# Patient Record
Sex: Male | Born: 1955
Health system: Southern US, Community
[De-identification: ages and names within clinical notes are randomized; demographics above are authoritative.]

## PROBLEM LIST (undated history)

## (undated) DIAGNOSIS — F1010 Alcohol abuse, uncomplicated: Secondary | ICD-10-CM

## (undated) DIAGNOSIS — B1920 Unspecified viral hepatitis C without hepatic coma: Secondary | ICD-10-CM

## (undated) DIAGNOSIS — K219 Gastro-esophageal reflux disease without esophagitis: Secondary | ICD-10-CM

## (undated) DIAGNOSIS — I10 Essential (primary) hypertension: Secondary | ICD-10-CM

## (undated) DIAGNOSIS — M542 Cervicalgia: Secondary | ICD-10-CM

## (undated) HISTORY — PX: CHOLECYSTECTOMY: SHX55

## (undated) HISTORY — PX: OTHER SURGICAL HISTORY: SHX169

## (undated) HISTORY — DX: Unspecified viral hepatitis C without hepatic coma: B19.20

## (undated) HISTORY — PX: VASECTOMY: SHX75

## (undated) HISTORY — PX: HERNIA REPAIR: SHX51

## (undated) HISTORY — PX: KNEE ARTHROSCOPY: SUR90

---

## 2003-01-30 ENCOUNTER — Ambulatory Visit (HOSPITAL_COMMUNITY): Admission: RE | Admit: 2003-01-30 | Discharge: 2003-01-30 | Payer: Self-pay | Admitting: Family Medicine

## 2003-01-30 ENCOUNTER — Encounter: Payer: Self-pay | Admitting: Family Medicine

## 2003-02-09 ENCOUNTER — Encounter: Payer: Self-pay | Admitting: Family Medicine

## 2003-02-09 ENCOUNTER — Encounter (HOSPITAL_COMMUNITY): Admission: RE | Admit: 2003-02-09 | Discharge: 2003-03-11 | Payer: Self-pay | Admitting: Family Medicine

## 2003-02-20 ENCOUNTER — Inpatient Hospital Stay (HOSPITAL_COMMUNITY): Admission: EM | Admit: 2003-02-20 | Discharge: 2003-02-23 | Payer: Self-pay | Admitting: Emergency Medicine

## 2003-02-20 ENCOUNTER — Encounter: Payer: Self-pay | Admitting: Emergency Medicine

## 2003-02-21 ENCOUNTER — Encounter: Payer: Self-pay | Admitting: Internal Medicine

## 2003-02-23 ENCOUNTER — Encounter: Payer: Self-pay | Admitting: Cardiology

## 2003-03-20 ENCOUNTER — Encounter: Admission: RE | Admit: 2003-03-20 | Discharge: 2003-03-20 | Payer: Self-pay | Admitting: Orthopedic Surgery

## 2003-03-20 ENCOUNTER — Encounter: Payer: Self-pay | Admitting: Radiology

## 2003-03-20 ENCOUNTER — Encounter: Payer: Self-pay | Admitting: Orthopedic Surgery

## 2003-04-24 ENCOUNTER — Encounter: Admission: RE | Admit: 2003-04-24 | Discharge: 2003-04-24 | Payer: Self-pay | Admitting: Orthopedic Surgery

## 2004-08-09 ENCOUNTER — Ambulatory Visit: Payer: Self-pay | Admitting: Internal Medicine

## 2004-09-02 ENCOUNTER — Ambulatory Visit (HOSPITAL_COMMUNITY): Admission: RE | Admit: 2004-09-02 | Discharge: 2004-09-02 | Payer: Self-pay | Admitting: Orthopedic Surgery

## 2004-09-02 ENCOUNTER — Encounter (INDEPENDENT_AMBULATORY_CARE_PROVIDER_SITE_OTHER): Payer: Self-pay | Admitting: Specialist

## 2004-11-10 ENCOUNTER — Encounter: Admission: RE | Admit: 2004-11-10 | Discharge: 2004-11-10 | Payer: Self-pay | Admitting: Internal Medicine

## 2006-07-06 ENCOUNTER — Ambulatory Visit: Payer: Self-pay | Admitting: Internal Medicine

## 2006-07-20 ENCOUNTER — Ambulatory Visit (HOSPITAL_COMMUNITY): Admission: RE | Admit: 2006-07-20 | Discharge: 2006-07-20 | Payer: Self-pay | Admitting: Internal Medicine

## 2006-08-24 ENCOUNTER — Ambulatory Visit: Payer: Self-pay | Admitting: Internal Medicine

## 2007-05-24 ENCOUNTER — Ambulatory Visit (HOSPITAL_COMMUNITY): Admission: RE | Admit: 2007-05-24 | Discharge: 2007-05-24 | Payer: Self-pay | Admitting: Orthopaedic Surgery

## 2007-06-12 ENCOUNTER — Ambulatory Visit (HOSPITAL_COMMUNITY): Admission: RE | Admit: 2007-06-12 | Discharge: 2007-06-12 | Payer: Self-pay | Admitting: Neurosurgery

## 2009-02-23 ENCOUNTER — Ambulatory Visit (HOSPITAL_COMMUNITY): Admission: RE | Admit: 2009-02-23 | Discharge: 2009-02-23 | Payer: Self-pay | Admitting: Internal Medicine

## 2009-05-26 ENCOUNTER — Ambulatory Visit (HOSPITAL_COMMUNITY): Admission: RE | Admit: 2009-05-26 | Discharge: 2009-05-27 | Payer: Self-pay | Admitting: General Surgery

## 2009-05-26 ENCOUNTER — Encounter (INDEPENDENT_AMBULATORY_CARE_PROVIDER_SITE_OTHER): Payer: Self-pay | Admitting: General Surgery

## 2009-10-22 ENCOUNTER — Ambulatory Visit (HOSPITAL_COMMUNITY): Admission: RE | Admit: 2009-10-22 | Discharge: 2009-10-22 | Payer: Self-pay | Admitting: Orthopaedic Surgery

## 2010-03-04 ENCOUNTER — Ambulatory Visit (HOSPITAL_COMMUNITY): Admission: RE | Admit: 2010-03-04 | Discharge: 2010-03-04 | Payer: Self-pay | Admitting: General Surgery

## 2010-08-25 LAB — BASIC METABOLIC PANEL
BUN: 9 mg/dL (ref 6–23)
CO2: 26 mEq/L (ref 19–32)
Creatinine, Ser: 0.8 mg/dL (ref 0.4–1.5)
GFR calc Af Amer: >60 mL/min (ref >60)
Glucose, Bld: 112 mg/dL — ABNORMAL HIGH (ref 70–99)

## 2010-08-25 LAB — CBC
HCT: 46.8 % (ref 39.0–52.0)
Hemoglobin: 16 g/dL (ref 13.0–17.0)
MCH: 32.4 pg (ref 26.0–34.0)
MCV: 94.7 fL (ref 78.0–100.0)
Platelets: 234 10*3/uL (ref 150–400)
RBC: 4.94 MIL/uL (ref 4.22–5.81)
WBC: 7.9 10*3/uL (ref 4.0–10.5)

## 2010-08-25 LAB — DIFFERENTIAL
Eosinophils Absolute: 0.2 10*3/uL (ref 0.0–0.7)
Monocytes Relative: 11 % (ref 3–12)
Neutrophils Relative %: 54 % (ref 43–77)

## 2010-08-25 LAB — HEPATIC FUNCTION PANEL: Alkaline Phosphatase: 55 U/L (ref 39–117)

## 2010-08-25 LAB — SURGICAL PCR SCREEN: MRSA, PCR: NEGATIVE

## 2010-09-12 LAB — CBC
HCT: 41.8 % (ref 39.0–52.0)
Hemoglobin: 14.4 g/dL (ref 13.0–17.0)
MCHC: 34.4 g/dL (ref 30.0–36.0)
WBC: 10.3 10*3/uL (ref 4.0–10.5)

## 2010-09-12 LAB — HEPATIC FUNCTION PANEL
Albumin: 3.2 g/dL — ABNORMAL LOW (ref 3.5–5.2)
Indirect Bilirubin: 0.5 mg/dL (ref 0.3–0.9)
Total Protein: 6.2 g/dL (ref 6.0–8.3)

## 2010-09-12 LAB — BASIC METABOLIC PANEL
BUN: 3 mg/dL — ABNORMAL LOW (ref 6–23)
CO2: 25 mEq/L (ref 19–32)
Calcium: 8.6 mg/dL (ref 8.4–10.5)
Chloride: 106 mEq/L (ref 96–112)
Creatinine, Ser: 0.76 mg/dL (ref 0.4–1.5)
GFR calc Af Amer: >60 mL/min (ref >60)
GFR calc non Af Amer: >60 mL/min (ref >60)
Glucose, Bld: 113 mg/dL — ABNORMAL HIGH (ref 70–99)
Potassium: 4.2 mEq/L (ref 3.5–5.1)

## 2010-09-12 LAB — DIFFERENTIAL
Basophils Absolute: 0 10*3/uL (ref 0.0–0.1)
Eosinophils Relative: 1 % (ref 0–5)
Lymphocytes Relative: 13 % (ref 12–46)
Lymphs Abs: 1.3 10*3/uL (ref 0.7–4.0)
Neutrophils Relative %: 76 % (ref 43–77)

## 2010-09-13 LAB — CBC
HCT: 46.3 % (ref 39.0–52.0)
Hemoglobin: 15.8 g/dL (ref 13.0–17.0)
MCHC: 34.1 g/dL (ref 30.0–36.0)
MCV: 95.7 fL (ref 78.0–100.0)
Platelets: 273 10*3/uL (ref 150–400)
RDW: 12.4 % (ref 11.5–15.5)

## 2010-09-13 LAB — DIFFERENTIAL
Basophils Relative: 0 % (ref 0–1)
Eosinophils Absolute: 0.1 10*3/uL (ref 0.0–0.7)
Eosinophils Relative: 2 % (ref 0–5)
Lymphocytes Relative: 29 % (ref 12–46)
Monocytes Relative: 9 % (ref 3–12)
Neutrophils Relative %: 60 % (ref 43–77)

## 2010-09-13 LAB — HEPATIC FUNCTION PANEL: AST: 180 U/L — ABNORMAL HIGH (ref 0–37)

## 2010-09-13 LAB — BASIC METABOLIC PANEL
Calcium: 9.6 mg/dL (ref 8.4–10.5)
Chloride: 103 mEq/L (ref 96–112)
Creatinine, Ser: 0.81 mg/dL (ref 0.4–1.5)
GFR calc non Af Amer: >60 mL/min (ref >60)
Glucose, Bld: 152 mg/dL — ABNORMAL HIGH (ref 70–99)

## 2010-09-13 LAB — PROTIME-INR
INR: 0.99 (ref 0.00–1.49)
Prothrombin Time: 13 seconds (ref 11.6–15.2)

## 2010-10-28 NOTE — Procedures (Signed)
   NAME:  Micheal Serrano, Micheal Serrano NO.:  [id number]   MEDICAL RECORD NO.:  [id number]                   PATIENT TYPE:  INP   LOCATION:  A212                                 FACILITY:  [facility]   PHYSICIAN:  Jesse Sans. Wall, M.D.                DATE OF BIRTH:  08-16-1955   DATE OF PROCEDURE:  02/23/2003  DATE OF DISCHARGE:                                    STRESS TEST   EXERCISE CARDIOLITE   INDICATION:  [name] is a 55 year old male who no known coronary artery  disease who cardiac risk factors include tobacco abuse and male sex.  He  presented to the emergency room with atypical chest discomfort.  He has had  three sets of cardiac enzymes which are negative for acute myocardial  infarction.   BASELINE DATA:  EKG revealed sinus rhythm at 91 beats per minute with  nonspecific ST abnormalities.  Blood pressure is 120/88.   The patient exercised for a total of nine minutes 15 seconds to Bruce  protocol stage 4 and 10.1 METs.  The patient denied any chest discomfort.  He did have some mild shortness of breath toward the end of exercise.  He  had some leg fatigue as well.  Maximum heart rate achieved is 151 beats per  minute which is 86% of predicted maximum.  Maximum blood pressure is 178/88  which resolved down to 148/78.  EKG revealed some upsloping ST depression;  however, no ischemic changes and no arrhythmias were noted.   Final images and results are pending M.D. review.     Amy Mercy Riding, P.A. [facility]                     Thomas C. Wall, M.D.    AB/MEDQ  D:  02/23/2003  T:  02/23/2003  Job:  [id number]

## 2010-10-28 NOTE — H&P (Signed)
NAME:  Micheal Serrano, Micheal Serrano NO.:  [id number]   MEDICAL RECORD NO.:  [id number]                   PATIENT TYPE:  INP   LOCATION:  A212                                 FACILITY:  [facility]   PHYSICIAN:  Jackie Plum, M.D.             DATE OF BIRTH:  11-Apr-1956   DATE OF ADMISSION:  02/20/2003  DATE OF DISCHARGE:                                HISTORY & PHYSICAL   PRIMARY CARE PHYSICIAN:  Dr. Delbert Harness.   PROBLEM LIST:  1. Chest pain rule out myocardial ischemia.  2. Recent lumbar disk protrusion on prednisone and Elavil.  3. A 30 pack year history of cigarette smoking.   CHIEF COMPLAINT:  Chest pain.   The patient is a 55 year old Caucasian gentleman without any prior history  of hypertension, diabetes or hypercholesterolemia.  He presents with left  sided chest pain which initially radiated to his left arm, associated with  shortness of breath and diaphoresis.  Peak severity graded 8/10.  The  patient's pain was relieved by nitroglycerin in the ED to about 2-4/10.  No  history of nausea, vomiting, epigastric pain, fever or chills, cough or  sputum production.   PAST MEDICAL HISTORY:  Negative for diabetes, hypertension,  hypercholesterolemia or heart disease.   MEDICATION HISTORY:  1. He takes Elavil.  2. Prednisone 10 mg q.i.d.   No history of known drug allergies.   FAMILY HISTORY:  Notable for grandfather suffering myocardial infarction in  his 60s.   SOCIAL HISTORY:  He is married.  He is a Corporate investment banker.  He smokes  cigarettes as mentioned above and drinks alcohol occasionally.   REVIEW OF SYSTEMS:  Significant positive negatives as in HPI above,  otherwise unremarkable.   PHYSICAL EXAMINATION:  VITAL SIGNS:  Blood pressure was 117/80, pulse rate  of 68, temperature of 96.9, O2 saturation was 96% on room air.  GENERAL:  He was not in acute cardiopulmonary distress.  HEENT:  Pupils equal, round and reactive to light.  Extraocular  movements  were intact.  He was not pale.  He was not icteric. Oropharynx was moist.  NECK:  Supple.  No JVD.  LUNGS:  Lung fields with clear breath sounds without any crackles.  There  was no reproducible chest wall tenderness appreciated by me.  CARDIAC:  Regular, rate and rhythm without any gallops or murmur.  ABDOMEN:  No hepatosplenomegaly.  Bowel sounds were present.  They were  normoactive.  EXTREMITIES:  No edema.  No cyanosis.  CNS:  Patient is alert and oriented  x 3.   LAB WORK:  Chest x-ray showed small atelectasis versus small infiltrate  (this was a one-view x-ray).   Blood work showed WBC count of 18.0, hemoglobin 17.1, hematocrit 50.1, MCV  92.7, platelet count 345.  His absolute neutrophil count was 15.7 K per  microliter.  Sodium was 133, potassium 4.2, chloride 103, CO2 25, glucose  180, BUN 11, creatinine 0.5.  Total bilirubin 0.6.  Alkaline phosphatase 68,  SGOT 21, SGPT 40, total protein 6.1.  Albumin 3.7.  Calcium 9.  Total CK 27,  MB 1.4, troponin I less than 0.1.   A 12-lead EKG initially showed sinus tachycardia without any acute ST-T wave  changes.   Urine toxicology showed positive for opiates.   ASSESSMENT:  Chest pain.   A gentleman with cardiac risk factors of male sex, cigarette smoking and  probable positive family history.  The pain was relieved with nitroglycerin  in the emergency department is _______ for cardiac ischemia.  No jugular  venous distention or pedal edema.  Cardiac and pulmonary exams were  unremarkable.   PLAN OF CARE:  Admit the patient to a tele bed, rule out myocardial  infarction by serial cardiac enzymes.  Start him on aspirin, oxygen as  needed and supportive care for pain and p.r.n. nitroglycerin.  The patient  would need a stress test hopefully prior to discharge, if it can be  scheduled and done this weekend.   I spend some good amount of time trying to explain the physical findings and  workup and plan of care to the  patient and his wife and daughter at the  bedside.   Chest x-ray shows small atelectasis versus infiltrate.  The patient has  leukocytosis.  There is no evidence of significant pulmonary symptomatology.  The patient does not have any significant constitutional symptomatology.  I  believe that a leukocytosis is related to his steroid intake.  There is no  obvious clinical evidence of pneumonia in this patient.  We will obtain a  two-view chest x-ray and repeat his CBC.  Patient has hyperglycemia.  He  does not have a history of diabetes in the family.  This is likely steroid  induced.  It would need to be followed up on discharge when he comes off of  steroids.  He has mild hyponatremia of 133, etiology is unclear, however, he  is not symptomatic.  We will just follow this conservatively for now.   Thank you very much.                                               Jackie Plum, M.D.    GO/MEDQ  D:  02/20/2003  T:  02/21/2003  Job:  [id number]   cc:   Delbert Harness, MD

## 2010-10-28 NOTE — Consult Note (Signed)
NAME:  Micheal Serrano, Micheal Serrano NO.:  [id number]   MEDICAL RECORD NO.:  [id number]                   PATIENT TYPE:  INP   LOCATION:  A212                                 FACILITY:  [facility]   PHYSICIAN:  Jesse Sans. Wall, M.D.                DATE OF BIRTH:  01/17/1956   DATE OF CONSULTATION:  DATE OF DISCHARGE:                                   CONSULTATION   We were asked by Dr. Delbert Harness and Dr. Christen Butter to evaluate Ricardo Jericho with chest pain.   He is a 55 year old male who came to the emergency room Friday with chest  and left upper quadrant pain radiating to his left arm.  It was sharp.  It  started on Thursday evening, and nitroglycerin partially relieved it.   He has cardiac risk factors of age, sex, and heavy tobacco use.  He is type  A and driven, and he is a Scientist, physiological for  [employer].  He currently has a job in [place], [place].   He has recently had a problem with a pinched nerve in his back.  He has been  on a prednisone taper.  He also has a lumbar disk problem, and he is on  Elavil.   His lipids were checked recently, 180 was his total cholesterol.  He is not  overweight, does not have diabetes, and no history of hypertension.  There  is no premature history of coronary disease in his family.   PAST MEDICAL HISTORY:  He was on Elavil prior to admission and prednisone 10  mg q.i.d.  He is currently on Nicoderm patch, Ativan, aspirin 325 a day,  Protonix 40 a day, and his prednisone taper.  He is on Elavil at night 25 mg  q.h.s.   SOCIAL HISTORY:  He lives in [place] with his wife.  He has no children.  He drinks about four beers a day.  He has a history of remote cocaine and  marijuana use.  As mentioned above, he is a Web designer.   FAMILY HISTORY:  Noncontributory, as mentioned above.   REVIEW OF SYSTEMS:  Negative and noncontributory.   PHYSICAL EXAMINATION:   GENERAL:  He is a very nice gentleman in no acute  distress.  HEENT:  He has a ruddy complexion.  Skin is warm and dry otherwise.  VITAL SIGNS:  Blood pressure is 119/86, pulse 90 and regular, temperature is  97.2, respiratory rate is 18, O2 saturation is 98% on room air.  NEUROLOGIC:  Intact.  HEENT:  Unremarkable.  NECK:  Shows no JVD.  Carotid upstrokes are equal bilaterally without  bruits.  There is no thyromegaly.  Trachea is midline.  LUNGS:  Clear except for inspiratory wheezes and rhonchi.  CARDIOVASCULAR:  Reveals an S4.  ABDOMEN:  Soft, with good bowel sounds.  There is no tenderness  and no  organomegaly.  EXTREMITIES:  No cyanosis, clubbing, or edema.  Pulses are brisk.   Chest x-ray shows some chronic bronchitis with early COPD.   EKG showed sinus tachycardia with no ST segment changes.   On laboratory data his blood sugar was 180.  Hemoglobin 16.  LFTs were  normal.  CPK and troponins were negative x3.  Urinary drug screen was  positive for opiates.   ASSESSMENT AND RECOMMENDATIONS:  1. Chest discomfort with several cardiac risk factors.  He has no acute EKG     changes.  Cardiac enzymes are negative.  I would recommend exercise     stress Cardiolite to rule out obstructive coronary disease.  2. Tobacco use.  I would recommend smoking cessation.  We had a very long     heart-to-heart talk about this and inflamed plaque and acute coronary     syndromes.   Thank you for the consultation.                                               Thomas C. Wall, M.D.    TCW/MEDQ  D:  02/23/2003  T:  02/23/2003  Job:  [id number]   cc:   Delbert Harness, MD

## 2010-10-28 NOTE — Discharge Summary (Signed)
   NAME:  Micheal Serrano, Micheal Serrano NO.:  [id number]   MEDICAL RECORD NO.:  [id number]                   PATIENT TYPE:  INP   LOCATION:  A212                                 FACILITY:  [facility]   PHYSICIAN:  Hanley Hays. Dechurch, M.D.           DATE OF BIRTH:  [birth year]   DATE OF ADMISSION:  02/20/2003  DATE OF DISCHARGE:  02/23/2003                                 DISCHARGE SUMMARY   DIAGNOSES:  1. Chest pain, non-cardiac.  2. Probable gastroesophageal reflux.  3. Lumbar disk protrusion, on prednisone and Elavil.  4. Tobacco abuse.  5. Hyperglycemia, glucose 180.   DISPOSITION:  The patient is discharged to home.   DISCHARGE MEDICATIONS:  1. Elavil 25 to 50 mg at h.s.  The patient instructed on upward titration     and side effects.  2. Prednisone 20 mg daily.  3. Prevacid 30 mg daily on an empty stomach.   FOLLOWUP:  As scheduled, Dr. Romeo Apple and Dr. Delbert Harness.  The patient will  need a hemoglobin A1C.   HOSPITAL COURSE:  A 55 year old Caucasian male with cardiac risk factors  presented to the hospital with chest pain which he described as 8 to 10,  radiating to the left arm, although he states this pain has been going off  and on for 10 years.  Apparently, he had some shortness of breath and  diaphoresis, and in the emergency room was helped with a sublingual  nitroglycerin.  He is admitted for rule out and further evaluation.  The  enzymes remain normal.  The only significant finding during this hospital  stay was a glucose of 180, however, he was on steroids.  His white count was  also elevated at 15 to 18, but there was no evidence of infection.  The  patient had noted that the prednisone and Elavil were helping his back,  although he had been taking the Elavil during the daytime which clearly  caused sedation.  The patient underwent a Cardiolite stress evaluation on  the day of discharge and there was no evidence of inducible ischemia.  He  did note  some of the chest pain that he normally gets.  The patient remained  stable.  The plan was discussed with him, including the need to follow up  his glucose.  He is discharged in stable condition with the regimen as noted  above.                                               Hanley Hays Josefine Class, M.D.    FED/MEDQ  D:  02/23/2003  T:  02/23/2003  Job:  [id number]   cc:   Delbert Harness, MD

## 2010-11-29 ENCOUNTER — Ambulatory Visit (HOSPITAL_COMMUNITY)
Admission: EM | Admit: 2010-11-29 | Discharge: 2010-11-29 | Disposition: A | Discharge status: Home / Self Care | Payer: Self-pay | Attending: Emergency Medicine | Admitting: Emergency Medicine

## 2011-02-27 ENCOUNTER — Emergency Department (HOSPITAL_COMMUNITY): Payer: BC Managed Care – PPO

## 2011-02-27 ENCOUNTER — Other Ambulatory Visit: Payer: Self-pay

## 2011-02-27 ENCOUNTER — Inpatient Hospital Stay (HOSPITAL_COMMUNITY)
Admission: EM | Admit: 2011-02-27 | Discharge: 2011-03-01 | DRG: 182 | Disposition: A | Discharge status: Home / Self Care | Payer: BC Managed Care – PPO | Attending: Internal Medicine | Admitting: Internal Medicine

## 2011-02-27 ENCOUNTER — Encounter: Payer: Self-pay | Admitting: *Deleted

## 2011-02-27 DIAGNOSIS — I10 Essential (primary) hypertension: Secondary | ICD-10-CM | POA: Diagnosis present

## 2011-02-27 DIAGNOSIS — R7401 Elevation of levels of liver transaminase levels: Secondary | ICD-10-CM | POA: Diagnosis present

## 2011-02-27 DIAGNOSIS — R079 Chest pain, unspecified: Secondary | ICD-10-CM | POA: Diagnosis present

## 2011-02-27 DIAGNOSIS — Z720 Tobacco use: Secondary | ICD-10-CM | POA: Diagnosis present

## 2011-02-27 DIAGNOSIS — F17200 Nicotine dependence, unspecified, uncomplicated: Secondary | ICD-10-CM | POA: Diagnosis present

## 2011-02-27 DIAGNOSIS — E876 Hypokalemia: Secondary | ICD-10-CM | POA: Diagnosis present

## 2011-02-27 DIAGNOSIS — R7301 Impaired fasting glucose: Secondary | ICD-10-CM | POA: Diagnosis present

## 2011-02-27 DIAGNOSIS — K219 Gastro-esophageal reflux disease without esophagitis: Principal | ICD-10-CM | POA: Diagnosis present

## 2011-02-27 DIAGNOSIS — R0789 Other chest pain: Secondary | ICD-10-CM | POA: Diagnosis present

## 2011-02-27 HISTORY — DX: Gastro-esophageal reflux disease without esophagitis: K21.9

## 2011-02-27 HISTORY — DX: Essential (primary) hypertension: I10

## 2011-02-27 LAB — DIFFERENTIAL
Basophils Absolute: 0 10*3/uL (ref 0.0–0.1)
Eosinophils Absolute: 0.3 10*3/uL (ref 0.0–0.7)
Eosinophils Relative: 4 % (ref 0–5)
Lymphocytes Relative: 36 % (ref 12–46)
Lymphs Abs: 3.2 10*3/uL (ref 0.7–4.0)
Monocytes Absolute: 1.1 10*3/uL — ABNORMAL HIGH (ref 0.1–1.0)

## 2011-02-27 LAB — BASIC METABOLIC PANEL
CO2: 31 mEq/L (ref 19–32)
Chloride: 97 mEq/L (ref 96–112)
Creatinine, Ser: 0.72 mg/dL (ref 0.50–1.35)
Glucose, Bld: 111 mg/dL — ABNORMAL HIGH (ref 70–99)

## 2011-02-27 LAB — HEPATIC FUNCTION PANEL
ALT: 140 U/L — ABNORMAL HIGH (ref 0–53)
Albumin: 3.9 g/dL (ref 3.5–5.2)
Alkaline Phosphatase: 71 U/L (ref 39–117)
Total Protein: 7.4 g/dL (ref 6.0–8.3)

## 2011-02-27 LAB — CBC
HCT: 50 % (ref 39.0–52.0)
MCH: 32.5 pg (ref 26.0–34.0)
MCV: 93.3 fL (ref 78.0–100.0)
RDW: 12.8 % (ref 11.5–15.5)
WBC: 8.9 10*3/uL (ref 4.0–10.5)

## 2011-02-27 MED ORDER — HYDROMORPHONE HCL 1 MG/ML IJ SOLN
1.0000 mg | Freq: Once | INTRAMUSCULAR | Status: AC
  Administered 2011-02-27: 1 mg via INTRAVENOUS
  Filled 2011-02-27: qty 1

## 2011-02-27 MED ORDER — PANTOPRAZOLE SODIUM 40 MG IV SOLR
40.0000 mg | Freq: Once | INTRAVENOUS | Status: AC
  Administered 2011-02-27: 40 mg via INTRAVENOUS
  Filled 2011-02-27: qty 40

## 2011-02-27 MED ORDER — ONDANSETRON HCL 4 MG/2ML IJ SOLN
4.0000 mg | Freq: Once | INTRAMUSCULAR | Status: AC
  Administered 2011-02-27: 4 mg via INTRAVENOUS
  Filled 2011-02-27: qty 2

## 2011-02-27 MED ORDER — GI COCKTAIL ~~LOC~~
30.0000 mL | Freq: Once | ORAL | Status: AC
  Administered 2011-02-27: 30 mL via ORAL
  Filled 2011-02-27: qty 30

## 2011-02-27 MED ORDER — SODIUM CHLORIDE 0.9 % IV SOLN
INTRAVENOUS | Status: DC
  Administered 2011-02-27: 1000 mL via INTRAVENOUS

## 2011-02-27 MED ORDER — ASPIRIN 81 MG PO CHEW
324.0000 mg | CHEWABLE_TABLET | Freq: Once | ORAL | Status: AC
  Administered 2011-02-27: 324 mg via ORAL
  Filled 2011-02-27: qty 4

## 2011-02-27 MED ORDER — NITROGLYCERIN 0.4 MG SL SUBL
0.4000 mg | SUBLINGUAL_TABLET | SUBLINGUAL | Status: DC | PRN
  Administered 2011-02-27 (×2): 0.4 mg via SUBLINGUAL
  Filled 2011-02-27: qty 25

## 2011-02-27 NOTE — ED Notes (Signed)
Pt advised results are looking good so far. Pt still reports epigastric pain at this time. Advised edp should be in soon to see him.

## 2011-02-27 NOTE — ED Notes (Signed)
Pt states no relief from nitro at this time

## 2011-02-27 NOTE — ED Provider Notes (Signed)
History     CSN: [id number] Arrival date & time: 02/27/2011  7:50 PM   Chief Complaint  Patient presents with  . Chest Pain     (Include location/radiation/quality/duration/timing/severity/associated sxs/prior treatment) Patient is a 55 y.o. male presenting with chest pain. The history is provided by the patient.  Chest Pain The chest pain began 3 - 5 hours ago. Duration of episode(s) is 5 minutes. Chest pain occurs intermittently. The chest pain is unchanged. At its most intense, the pain is at 10/10. The pain is currently at 0/10. The severity of the pain is moderate. The quality of the pain is described as pressure-like and tightness. The pain does not radiate. Pertinent negatives for primary symptoms include no fever, no syncope, no shortness of breath, no cough, no wheezing, no abdominal pain, no nausea and no vomiting.  Pertinent negatives for associated symptoms include no diaphoresis, no lower extremity edema, no near-syncope and no weakness. He tried nothing for the symptoms. Risk factors include male gender.  His past medical history is significant for hypertension.  Procedure history is negative for cardiac catheterization, stress echo, stress thallium and exercise treadmill test.      Past Medical History  Diagnosis Date  . Hypertension      Past Surgical History  Procedure Date  . Cholecystectomy   . Knee arthroscopy     No family history on file.  History  Substance Use Topics  . Smoking status: Current Everyday Smoker  . Smokeless tobacco: Not on file  . Alcohol Use: Yes      Review of Systems  Constitutional: Negative for fever and diaphoresis.  HENT: Negative for congestion.   Eyes: Negative for visual disturbance.  Respiratory: Negative for cough, shortness of breath and wheezing.   Cardiovascular: Positive for chest pain. Negative for syncope and near-syncope.  Gastrointestinal: Negative for nausea, vomiting and abdominal pain.  Genitourinary:  Negative for dysuria.  Musculoskeletal: Negative for back pain.  Skin: Negative for rash.  Neurological: Negative for speech difficulty, weakness, light-headedness and headaches.  Hematological: Negative for adenopathy.    Allergies  Review of patient's allergies indicates no known allergies.  Home Medications   Current Outpatient Rx  Name Route Sig Dispense Refill  . OLMESARTAN MEDOXOMIL 20 MG PO TABS Oral Take 20 mg by mouth daily.      Marland Kitchen RANITIDINE HCL 75 MG PO TABS Oral Take 75 mg by mouth 2 (two) times daily.        Physical Exam    BP 134/79  Pulse 88  Temp(Src) 97.6 F (36.4 C) (Oral)  Resp 16  Ht 5\' 6"  (1.676 m)  Wt 155 lb (70.308 kg)  BMI 25.02 kg/m2  SpO2 94%  Physical Exam  Nursing note and vitals reviewed. Constitutional: He is oriented to person, place, and time. He appears well-developed and well-nourished. No distress.  HENT:  Head: Normocephalic and atraumatic.  Eyes: Conjunctivae and EOM are normal. Pupils are equal, round, and reactive to light.  Neck: Normal range of motion. Neck supple.  Cardiovascular: Normal rate, regular rhythm and normal heart sounds.   No murmur heard. Pulmonary/Chest: Effort normal and breath sounds normal. He has no wheezes.  Abdominal: Soft. Bowel sounds are normal. There is no tenderness.  Musculoskeletal: Normal range of motion. He exhibits no edema and no tenderness.  Neurological: He is alert and oriented to person, place, and time. No cranial nerve deficit. He exhibits normal muscle tone. Coordination normal.  Skin: Skin is warm. No rash noted.  ED Course  Procedures  Results for orders placed during the hospital encounter of 02/27/11  BASIC METABOLIC PANEL      Component Value Range   Sodium 138  135 - 145 (mEq/L)   Potassium 3.1 (*) 3.5 - 5.1 (mEq/L)   Chloride 97  96 - 112 (mEq/L)   CO2 31  19 - 32 (mEq/L)   Glucose, Bld 111 (*) 70 - 99 (mg/dL)   BUN 8  6 - 23 (mg/dL)   Creatinine, Ser 4.54  0.50 - 1.35  (mg/dL)   Calcium 9.4  8.4 - 09.8 (mg/dL)   GFR calc non Af Amer >60  >60 (mL/min)   GFR calc Af Amer >60  >60 (mL/min)  CBC      Component Value Range   WBC 8.9  4.0 - 10.5 (K/uL)   RBC 5.36  4.22 - 5.81 (MIL/uL)   Hemoglobin 17.4 (*) 13.0 - 17.0 (g/dL)   HCT 11.9  14.7 - 82.9 (%)   MCV 93.3  78.0 - 100.0 (fL)   MCH 32.5  26.0 - 34.0 (pg)   MCHC 34.8  30.0 - 36.0 (g/dL)   RDW 56.2  13.0 - 86.5 (%)   Platelets 221  150 - 400 (K/uL)  DIFFERENTIAL      Component Value Range   Neutrophils Relative 48  43 - 77 (%)   Neutro Abs 4.3  1.7 - 7.7 (K/uL)   Lymphocytes Relative 36  12 - 46 (%)   Lymphs Abs 3.2  0.7 - 4.0 (K/uL)   Monocytes Relative 12  3 - 12 (%)   Monocytes Absolute 1.1 (*) 0.1 - 1.0 (K/uL)   Eosinophils Relative 4  0 - 5 (%)   Eosinophils Absolute 0.3  0.0 - 0.7 (K/uL)   Basophils Relative 0  0 - 1 (%)   Basophils Absolute 0.0  0.0 - 0.1 (K/uL)  POCT I-STAT TROPONIN I      Component Value Range   Troponin i, poc 0.00  0.00 - 0.08 (ng/mL)   Comment 3            Dg Chest Portable 1 View  02/27/2011  *RADIOLOGY REPORT*  Clinical Data: Chest pain and shortness of breath.  PORTABLE CHEST - 1 VIEW  Comparison: 06/12/2007  Findings: Upper limits normal heart size identified. There is no evidence of focal airspace disease, pulmonary edema, pulmonary nodule/mass, pleural effusion, or pneumothorax. No acute bony abnormalities are identified.  IMPRESSION: No evidence of acute cardiopulmonary disease.  Original Report Authenticated By: Rosendo Gros, M.D.     Date: 02/27/2011  Rate: 81  Rhythm: normal sinus rhythm  QRS Axis: normal  Intervals: normal  ST/T Wave abnormalities: normal  Conduction Disutrbances:none  Narrative Interpretation:   Old EKG Reviewed: none available     MDM RECURRING INTERMITTANT CHEST PAIN SINCE 1800. NOT LIKE GERD IN PAST. IMPROVED WITH ASA, NTG? AND PROTONIX. FIRST TN NEGATIVE AND EKG NSR NO ACUTE CHANGES. CXR NEGATIVE FOR PNEUMONIA.    IMP:  CHEST PAIN RULE OUT NEEDED.       Shelda Jakes, MD 03/01/11 [phone]

## 2011-02-27 NOTE — ED Notes (Signed)
Pt reports a burning feeling in his chest that feels like indigestion. Reports has had this before & was reflux. Has had some SOB w/ this pain.

## 2011-02-27 NOTE — ED Notes (Signed)
Pt reports no real change in pain after 1st nitro.

## 2011-02-27 NOTE — ED Notes (Signed)
Pt reports pain is better after getting meds for his stomach. edp notified.

## 2011-02-27 NOTE — ED Notes (Signed)
Report called to floor

## 2011-02-27 NOTE — ED Notes (Signed)
Chest pain for 3 hours, feels like indigestion

## 2011-02-27 NOTE — ED Notes (Signed)
Family reports pt chest pain is now 10/10. edp notified.

## 2011-02-28 ENCOUNTER — Inpatient Hospital Stay (HOSPITAL_COMMUNITY): Payer: BC Managed Care – PPO

## 2011-02-28 ENCOUNTER — Encounter (HOSPITAL_COMMUNITY): Payer: Self-pay | Admitting: *Deleted

## 2011-02-28 DIAGNOSIS — K219 Gastro-esophageal reflux disease without esophagitis: Secondary | ICD-10-CM | POA: Diagnosis present

## 2011-02-28 DIAGNOSIS — R7401 Elevation of levels of liver transaminase levels: Secondary | ICD-10-CM | POA: Diagnosis present

## 2011-02-28 DIAGNOSIS — Z720 Tobacco use: Secondary | ICD-10-CM | POA: Diagnosis present

## 2011-02-28 DIAGNOSIS — R7301 Impaired fasting glucose: Secondary | ICD-10-CM | POA: Diagnosis present

## 2011-02-28 DIAGNOSIS — E876 Hypokalemia: Secondary | ICD-10-CM | POA: Diagnosis present

## 2011-02-28 LAB — LIPID PANEL
HDL: 51 mg/dL (ref >39)
LDL Cholesterol: 80 mg/dL (ref 0–99)
Triglycerides: 61 mg/dL (ref <150)
VLDL: 12 mg/dL (ref 0–40)

## 2011-02-28 LAB — CARDIAC PANEL(CRET KIN+CKTOT+MB+TROPI)
CK, MB: 2.4 ng/mL (ref 0.3–4.0)
Relative Index: INVALID (ref 0.0–2.5)
Total CK: 79 U/L (ref 7–232)

## 2011-02-28 LAB — COMPREHENSIVE METABOLIC PANEL
ALT: 107 U/L — ABNORMAL HIGH (ref 0–53)
AST: 55 U/L — ABNORMAL HIGH (ref 0–37)
Alkaline Phosphatase: 60 U/L (ref 39–117)
CO2: 30 mEq/L (ref 19–32)
Chloride: 100 mEq/L (ref 96–112)
GFR calc non Af Amer: >60 mL/min (ref >60)
Sodium: 136 mEq/L (ref 135–145)
Total Bilirubin: 0.5 mg/dL (ref 0.3–1.2)

## 2011-02-28 LAB — LIPASE, BLOOD: Lipase: 21 U/L (ref 11–59)

## 2011-02-28 MED ORDER — ONDANSETRON HCL 4 MG/2ML IJ SOLN: 4.0000 mg | Freq: Four times a day (QID) | INTRAMUSCULAR | Status: DC | PRN

## 2011-02-28 MED ORDER — SODIUM CHLORIDE 0.9 % IV SOLN: INTRAVENOUS | Status: DC

## 2011-02-28 MED ORDER — PANTOPRAZOLE SODIUM 40 MG PO TBEC
40.0000 mg | DELAYED_RELEASE_TABLET | Freq: Every day | ORAL | Status: DC
  Administered 2011-02-28: 40 mg via ORAL
  Filled 2011-02-28: qty 1

## 2011-02-28 MED ORDER — OLMESARTAN MEDOXOMIL 20 MG PO TABS
20.0000 mg | ORAL_TABLET | Freq: Every day | ORAL | Status: DC
  Administered 2011-02-28 – 2011-03-01 (×2): 20 mg via ORAL
  Filled 2011-02-28 (×2): qty 1

## 2011-02-28 MED ORDER — ENOXAPARIN SODIUM 40 MG/0.4ML ~~LOC~~ SOLN
40.0000 mg | Freq: Every day | SUBCUTANEOUS | Status: DC
  Administered 2011-02-28: 40 mg via SUBCUTANEOUS
  Filled 2011-02-28: qty 0.4

## 2011-02-28 MED ORDER — POTASSIUM CHLORIDE CRYS ER 20 MEQ PO TBCR
40.0000 meq | EXTENDED_RELEASE_TABLET | Freq: Once | ORAL | Status: AC
  Administered 2011-02-28: 40 meq via ORAL
  Filled 2011-02-28: qty 2

## 2011-02-28 MED ORDER — MORPHINE SULFATE 2 MG/ML IJ SOLN: 1.0000 mg | INTRAMUSCULAR | Status: DC | PRN

## 2011-02-28 MED ORDER — ASPIRIN EC 81 MG PO TBEC
81.0000 mg | DELAYED_RELEASE_TABLET | Freq: Every day | ORAL | Status: DC
  Administered 2011-02-28 – 2011-03-01 (×2): 81 mg via ORAL
  Filled 2011-02-28 (×2): qty 1

## 2011-02-28 MED ORDER — ALUM & MAG HYDROXIDE-SIMETH 200-200-20 MG/5ML PO SUSP: 30.0000 mL | Freq: Four times a day (QID) | ORAL | Status: DC | PRN

## 2011-02-28 MED ORDER — ONDANSETRON HCL 4 MG PO TABS: 4.0000 mg | ORAL_TABLET | Freq: Four times a day (QID) | ORAL | Status: DC | PRN

## 2011-02-28 MED ORDER — SODIUM CHLORIDE 0.9 % IV SOLN
INTRAVENOUS | Status: DC
  Administered 2011-02-28: 01:00:00 via INTRAVENOUS
  Filled 2011-02-28 (×2): qty 1000

## 2011-02-28 NOTE — Progress Notes (Signed)
*  PRELIMINARY RESULTS* Echocardiogram 2D Echocardiogram has been performed.  Micheal Serrano 02/28/2011, 3:54 PM

## 2011-02-28 NOTE — H&P (Signed)
  [id number] 

## 2011-02-28 NOTE — Progress Notes (Signed)
Subjective:  Micheal Serrano denies any current chest pain. He denies dyspnea. No diaphoresis, nausea, or vomiting overnight. He has essentially been asymptomatic since his initial chest pain symptoms were treated in the emergency department yesterday. Objective: Vital signs in last 24 hours: Filed Vitals:   02/27/11 2105 02/27/11 2114 02/27/11 2224 02/27/11 2318  BP: 127/95 134/79 121/76 112/66  Pulse: 76 88 63 71  Temp:    97.5 F (36.4 C)  TempSrc:    Oral  Resp: 20 16 16 16   Height:      Weight:      SpO2: 91% 94% 94% 94%   Weight change:  No intake or output data in the 24 hours ending 02/28/11 1027 General Appearance:    Alert, cooperative, no distress, appears stated age  Lungs:     Diminished to auscultation bilaterally, respirations unlabored   Heart:    Regular rate and rhythm, S1 and S2 normal, no murmur, rub   or gallop  Abdomen:     Soft, non-tender, bowel sounds active all four quadrants,    no masses, no organomegaly  Extremities:   Extremities normal, atraumatic, no cyanosis or edema  Pulses:   1+ and symmetric all extremities   Lab Results: Results for orders placed during the hospital encounter of 02/27/11 (from the past 24 hour(s))  BASIC METABOLIC PANEL     Status: Abnormal   Collection Time   02/27/11  8:06 PM      Component Value Range   Sodium 138  135 - 145 (mEq/L)   Potassium 3.1 (*) 3.5 - 5.1 (mEq/L)   Chloride 97  96 - 112 (mEq/L)   CO2 31  19 - 32 (mEq/L)   Glucose, Bld 111 (*) 70 - 99 (mg/dL)   BUN 8  6 - 23 (mg/dL)   Creatinine, Ser 1.19  0.50 - 1.35 (mg/dL)   Calcium 9.4  8.4 - 14.7 (mg/dL)   GFR calc non Af Amer >60  >60 (mL/min)   GFR calc Af Amer >60  >60 (mL/min)  CBC     Status: Abnormal   Collection Time   02/27/11  8:06 PM      Component Value Range   WBC 8.9  4.0 - 10.5 (K/uL)   RBC 5.36  4.22 - 5.81 (MIL/uL)   Hemoglobin 17.4 (*) 13.0 - 17.0 (g/dL)   HCT 82.9  56.2 - 13.0 (%)   MCV 93.3  78.0 - 100.0 (fL)   MCH 32.5  26.0 - 34.0  (pg)   MCHC 34.8  30.0 - 36.0 (g/dL)   RDW 86.5  78.4 - 69.6 (%)   Platelets 221  150 - 400 (K/uL)  DIFFERENTIAL     Status: Abnormal   Collection Time   02/27/11  8:06 PM      Component Value Range   Neutrophils Relative 48  43 - 77 (%)   Neutro Abs 4.3  1.7 - 7.7 (K/uL)   Lymphocytes Relative 36  12 - 46 (%)   Lymphs Abs 3.2  0.7 - 4.0 (K/uL)   Monocytes Relative 12  3 - 12 (%)   Monocytes Absolute 1.1 (*) 0.1 - 1.0 (K/uL)   Eosinophils Relative 4  0 - 5 (%)   Eosinophils Absolute 0.3  0.0 - 0.7 (K/uL)   Basophils Relative 0  0 - 1 (%)   Basophils Absolute 0.0  0.0 - 0.1 (K/uL)  POCT I-STAT TROPONIN I     Status: Normal   Collection Time  02/27/11  8:08 PM      Component Value Range   Troponin i, poc 0.00  0.00 - 0.08 (ng/mL)   Comment 3           D-DIMER, QUANTITATIVE     Status: Normal   Collection Time   02/27/11  9:17 PM      Component Value Range   D-Dimer, Quant 0.44  0.00 - 0.48 (ug/mL-FEU)  HEPATIC FUNCTION PANEL     Status: Abnormal   Collection Time   02/27/11  9:17 PM      Component Value Range   Total Protein 7.4  6.0 - 8.3 (g/dL)   Albumin 3.9  3.5 - 5.2 (g/dL)   AST 82 (*) 0 - 37 (U/L)   ALT 140 (*) 0 - 53 (U/L)   Alkaline Phosphatase 71  39 - 117 (U/L)   Total Bilirubin 0.5  0.3 - 1.2 (mg/dL)   Bilirubin, Direct 0.1  0.0 - 0.3 (mg/dL)   Indirect Bilirubin 0.4  0.3 - 0.9 (mg/dL)  CREATININE, SERUM     Status: Normal   Collection Time   02/28/11  3:00 AM      Component Value Range   Creatinine, Ser 0.54  0.50 - 1.35 (mg/dL)   GFR calc non Af Amer >60  >60 (mL/min)   GFR calc Af Amer >60  >60 (mL/min)  LIPASE, BLOOD     Status: Normal   Collection Time   02/28/11  3:00 AM      Component Value Range   Lipase 21  11 - 59 (U/L)  COMPREHENSIVE METABOLIC PANEL     Status: Abnormal   Collection Time   02/28/11  3:00 AM      Component Value Range   Sodium 136  135 - 145 (mEq/L)   Potassium 3.2 (*) 3.5 - 5.1 (mEq/L)   Chloride 100  96 - 112 (mEq/L)   CO2 30   19 - 32 (mEq/L)   Glucose, Bld 157 (*) 70 - 99 (mg/dL)   BUN 7  6 - 23 (mg/dL)   Creatinine, Ser 1.61  0.50 - 1.35 (mg/dL)   Calcium 8.2 (*) 8.4 - 10.5 (mg/dL)   Total Protein 5.9 (*) 6.0 - 8.3 (g/dL)   Albumin 3.0 (*) 3.5 - 5.2 (g/dL)   AST 55 (*) 0 - 37 (U/L)   ALT 107 (*) 0 - 53 (U/L)   Alkaline Phosphatase 60  39 - 117 (U/L)   Total Bilirubin 0.5  0.3 - 1.2 (mg/dL)   GFR calc non Af Amer >60  >60 (mL/min)   GFR calc Af Amer >60  >60 (mL/min)  CARDIAC PANEL(CRET KIN+CKTOT+MB+TROPI)     Status: Normal   Collection Time   02/28/11  3:00 AM      Component Value Range   Total CK 97  7 - 232 (U/L)   CK, MB 2.8  0.3 - 4.0 (ng/mL)   Troponin I <0.30  <0.30 (ng/mL)   Relative Index RELATIVE INDEX IS INVALID  0.0 - 2.5    Micro Results: No results found for this or any previous visit (from the past 240 hour(s)). Studies/Results: Dg Chest Portable 1 View  02/27/2011  *RADIOLOGY REPORT*  Clinical Data: Chest pain and shortness of breath.  PORTABLE CHEST - 1 VIEW  Comparison: 06/12/2007  Findings: Upper limits normal heart size identified. There is no evidence of focal airspace disease, pulmonary edema, pulmonary nodule/mass, pleural effusion, or pneumothorax. No acute bony abnormalities are identified.  IMPRESSION: No evidence of acute cardiopulmonary disease.  Original Report Authenticated By: Rosendo Gros, M.D.   Medications: I have reviewed patient's medications. Scheduled Meds:   . aspirin  324 mg Oral Once  . aspirin EC  81 mg Oral Daily  . enoxaparin  40 mg Subcutaneous Daily  . gi cocktail  30 mL Oral Once  . HYDROmorphone  1 mg Intravenous Once  . olmesartan  20 mg Oral Daily  . ondansetron  4 mg Intravenous Once  . pantoprazole  40 mg Oral Q1200  . pantoprazole  40 mg Intravenous Once  . DISCONTD: sodium chloride   Intravenous STAT   Continuous Infusions:   . sodium chloride 0.9 % 1,000 mL with potassium chloride 20 mEq infusion 75 mL/hr at 02/28/11 0045  . DISCONTD:  sodium chloride 100 mL/hr at 02/28/11 0000   PRN Meds:.alum & mag hydroxide-simeth, morphine, ondansetron (ZOFRAN) IV, ondansetron, DISCONTD: nitroGLYCERIN Assessment/Plan: Active Problems:  Chest pain:  The patient states that he has not had any further chest pain since he was evaluated in emergency department.   At this point, it appears his chest pain is most likely due to underlying gastroesophageal reflux disease and not a primary cardiac problem.  HTN (hypertension):  Blood pressure is currently under well control with olmesartan.  GERD (gastroesophageal reflux disease):  Continue PPI therapy.  Hypokalemia:  Replete.  Tobacco abuse:  Provide tobacco cessation counseling.  Transaminitis:  This appears to be chronic. We'll check a viral hepatitis panel and have him followup with his primary care physician for further evaluation. Is not on a statin or other concerning medications.  IFG (impaired fasting glucose):  Check the patient's hemoglobin A1c to determine if this is a problem.   LOS: 1 day   RAMA,CHRISTINA 02/28/2011, 10:27 AM

## 2011-02-28 NOTE — H&P (Signed)
NAME:  BOBBI, KOZAKIEWICZ NO.:  [id number]  MEDICAL RECORD NO.:  [id number]  LOCATION:  A328                          FACILITY:  [facility]  PHYSICIAN:  Tarry Kos, MD       DATE OF BIRTH:  08-20-1955  DATE OF ADMISSION:  02/27/2011 DATE OF DISCHARGE:  LH                             HISTORY & PHYSICAL   CHIEF COMPLAINT:  Chest plain/epigastric pain.  HISTORY OF PRESENT ILLNESS:  Mr. Hyson is a pleasant 55 year old male with a history of hypertension, tobacco abuse, and GERD who presents to the emergency department after several hours earlier this afternoon of having substernal chest pain that it was very burning in nature and epigastric pain with associated nausea and vomiting and shortness of breath.  He states that he did have a stress test about 10 years ago, which was negative.  However, he has never had any other cardiac workup. He has never had a heart attack; however, he has a significant history of heartburn in the past.  He used to take a PPI for this years ago, but he has been off it for several years and over the last several months, he has been having heartburn symptoms.  Again, however, today it was much worse than what it normally is.  Normally, he has a sensation when he lays down at night, but today it started earlier in the day and lasted for at least 3 hours and today, the pain was very severe and caused nausea and vomiting, which was nonbloody.  He says the pain does not radiate anywhere, but it is a burning sensation in the substernal area and the epigastric area.  He does not have his gallbladder.  Denies any fevers.  Denies any diarrhea.  He received nitroglycerin, GI cocktail, and a PPI at the emergency department, which relieved his pain, unsure which medication relieved his pain, so he is currently chest pain-free and is back to feeling normal.  REVIEW OF SYSTEMS:  Otherwise negative.  PAST MEDICAL HISTORY:  Hypertension, GERD in the past,  status post cholecystectomy.  SOCIAL HISTORY:  He does smoke a pack a day.  He drinks about three beers a day.  Denies any other illegal drug use.  He is married and lives with his wife.  ALLERGIES:  None.  MEDICATIONS:  He denies taking any Goody's powder on NSAIDs over the counter.  He does take Benicar for his blood pressure, just recently started taking Zantac again.  PHYSICAL EXAMINATION:  VITAL SIGNS:  Blood pressure is 112/66, temperature 97.5, pulse 71, respirations 16, 94% O2 sats on room air. GENERAL:  He is alert and oriented x4, in no apparent distress, cooperative, friendly. HEENT:  Extraocular muscles intact.  Pupils equal and reactive to light. Oropharynx is clear.  Mucous membranes are moist. NECK:  No JVD.  No carotid bruits: COR:  Regular rate and rhythm without murmur, rubs or gallops. CHEST:  Clear to auscultation bilaterally.  No wheezing, rhonchi, or rales. ABDOMEN:  Soft, nontender, nondistended.  Positive bowel sounds.  No hepatosplenomegaly. EXTREMITIES:  No clubbing, cyanosis, edema. PSYCH:  Normal affect. NEURO:  No focal neurologic deficits. SKIN:  No rashes.  LABORATORY DATA:  His sodium is normal.  BUN and creatinine normal. Potassium is 3.1.  His AST and ALT are slightly elevated at 82 and 140. He has a normal alk phos, normal bili.  White count normal.  Hemoglobin 17.4.  Cardiac enzymes negative.  Chest x-ray is negative.  A 12-lead EKG normal, sinus rhythm without any acute ST or T-wave changes.  ASSESSMENT AND PLAN:  This is a 55 year old male with atypical chest pain, epigastric pain of unclear etiology. 1. Atypical chest pain/epigastric pain of unclear etiology.  The most     concerning thing will be to rule him out for acute coronary     syndrome.  We will place him on telemetry, rule him out with serial     cardiac enzymes and check a 2D echocardiogram in the morning.     However, his symptoms sound more GI in nature.  I am going to  add     on a lipase to his blood work and also check an abdominal     ultrasound, place him on a PPI.  Abdominal exam is benign right now     and he is chest pain free. 2. Slightly elevated LFTs.  We will repeat his LFTs in the morning and     again, check abdominal ultrasound. 3. Hypertension, stable.  Continue his Benicar. 4. Hypokalemia.  We will replace his potassium and provide him  some     IV fluids overnight. 5. Further recommendation pending on overall hospital course and the     results of the above orders.                                           ______________________________ Tarry Kos, MD     RD/MEDQ  D:  02/28/2011  T:  02/28/2011  Job:  [id number]

## 2011-03-01 LAB — HEPATITIS PANEL, ACUTE: Hep B C IgM: NEGATIVE

## 2011-03-01 LAB — CARDIAC PANEL(CRET KIN+CKTOT+MB+TROPI): Total CK: 66 U/L (ref 7–232)

## 2011-03-01 NOTE — Discharge Summary (Signed)
Physician Discharge Summary  Patient ID: Micheal Serrano MRN: [id number] DOB/AGE: 17-Feb-1956 55 y.o. Primary Care Physician:Dr Dwana Melena Admit date: 02/27/2011 Discharge date: 03/01/2011    Discharge Diagnoses:  1. Atypical chest pain, likely gastroesophageal reflux disease. 2. Hypertension. 3. Gastroesophageal reflux disease. 4. Tobacco abuse. 5. Elevated liver enzymes, known to have hepatitis C.   Current Discharge Medication List    CONTINUE these medications which have NOT CHANGED   Details  olmesartan (BENICAR) 20 MG tablet Take 20 mg by mouth daily.      ranitidine (ZANTAC) 75 MG tablet Take 75 mg by mouth 2 (two) times daily.          Discharged Condition: Stable and improved.    Consults: None.  Significant Diagnostic Studies: US Abdomen Complete  02/28/2011  *RADIOLOGY REPORT*  Clinical Data:  Epigastric and chest pain, hypertension  ULTRASOUND ABDOMEN:  Technique:  Sonography of upper abdominal structures was performed.  Comparison:  07/20/2006  Gallbladder:  Surgically absent  Common bile duct:  Normal caliber for post cholecystectomy, 6 mm diameter.  Liver:  Normal appearance  IVC:  Normal appearance  Pancreas:  Small portion of pancreatic body visualized, without focal mass.  Remainder of pancreas obscured by bowel gas.  Spleen:  Normal appearance, 6.4 cm length  Right kidney:  11.9 cm length. Normal morphology without mass or hydronephrosis.  Left kidney:  12.3 cm length. Normal morphology without mass or hydronephrosis.  Aorta:  Normal caliber  Other:  No free fluid  IMPRESSION: Post cholecystectomy. Inadequate visualization of pancreas. Remainder of exam normal.  Original Report Authenticated By: Lollie Marrow, M.D.   Dg Chest Portable 1 View  02/27/2011  *RADIOLOGY REPORT*  Clinical Data: Chest pain and shortness of breath.  PORTABLE CHEST - 1 VIEW  Comparison: 06/12/2007  Findings: Upper limits normal heart size identified. There is no evidence of focal  airspace disease, pulmonary edema, pulmonary nodule/mass, pleural effusion, or pneumothorax. No acute bony abnormalities are identified.  IMPRESSION: No evidence of acute cardiopulmonary disease.  Original Report Authenticated By: Rosendo Gros, M.D.           2D Echocardiogram without contrast      Ordering Physician:  Toma Deiters [id number]  Study Date:  02/28/11      Patient Information      Name  MRN   Description     Micheal Serrano  [id number]   55 year old Male              Result Narrative     *[facility]* [address] [place], [place] [address] [phone]  ------------------------------------------------------------ Transthoracic Echocardiography  Patient: Serrano, Micheal MR #: [id number] Study Date: 02/28/2011 Gender: M Age: 55 Height: 167.6cm Weight: 70.3kg BSA: 1.31m^2 Pt. Status: Room:  SONOGRAPHER Karrie Doffing ATTENDING Vanetta Mulders PERFORMING [facility] ADMITTING Onalee Hua, Rachal Dia Crawford, Rachal REFERRING Onalee Hua, Rachal cc:  ------------------------------------------------------------ LV EF: 55% - 60%  ------------------------------------------------------------ Indications: Chest pain 786.51.  ------------------------------------------------------------ History: PMH: GERD Risk factors: Current tobacco use. Hypertension.  ------------------------------------------------------------ Study Conclusions  Left ventricle: The cavity size was normal. There was mild concentric hypertrophy. Systolic function was normal. The estimated ejection fraction was in the range of 55% to 60%. Wall motion was normal; there were no regional wall motion abnormalities. Cannot assess LV diastolic function due to incomplete imaging and lack of tissue doppler. Transthoracic echocardiography. M-mode, complete 2D, spectral Doppler, and color Doppler. Height: Height: 167.6cm. Height: 66in. Weight:  Weight: 70.3kg.  Weight: 154.7lb. Body mass index: BMI: 25kg/m^2. Body surface area: BSA: 1.79m^2. Patient status: Inpatient. Location: Bedside.      Lab Results: Results for orders placed during the hospital encounter of 02/27/11 (from the past 48 hour(s))  BASIC METABOLIC PANEL     Status: Abnormal   Collection Time   02/27/11  8:06 PM      Component Value Range Comment   Sodium 138  135 - 145 (mEq/L)    Potassium 3.1 (*) 3.5 - 5.1 (mEq/L)    Chloride 97  96 - 112 (mEq/L)    CO2 31  19 - 32 (mEq/L)    Glucose, Bld 111 (*) 70 - 99 (mg/dL)    BUN 8  6 - 23 (mg/dL)    Creatinine, Ser 5.62  0.50 - 1.35 (mg/dL)    Calcium 9.4  8.4 - 10.5 (mg/dL)    GFR calc non Af Amer >60  >60 (mL/min)    GFR calc Af Amer >60  >60 (mL/min)   CBC     Status: Abnormal   Collection Time   02/27/11  8:06 PM      Component Value Range Comment   WBC 8.9  4.0 - 10.5 (K/uL)    RBC 5.36  4.22 - 5.81 (MIL/uL)    Hemoglobin 17.4 (*) 13.0 - 17.0 (g/dL)    HCT 13.0  86.5 - 78.4 (%)    MCV 93.3  78.0 - 100.0 (fL)    MCH 32.5  26.0 - 34.0 (pg)    MCHC 34.8  30.0 - 36.0 (g/dL)    RDW 69.6  29.5 - 28.4 (%)    Platelets 221  150 - 400 (K/uL)   DIFFERENTIAL     Status: Abnormal   Collection Time   02/27/11  8:06 PM      Component Value Range Comment   Neutrophils Relative 48  43 - 77 (%)    Neutro Abs 4.3  1.7 - 7.7 (K/uL)    Lymphocytes Relative 36  12 - 46 (%)    Lymphs Abs 3.2  0.7 - 4.0 (K/uL)    Monocytes Relative 12  3 - 12 (%)    Monocytes Absolute 1.1 (*) 0.1 - 1.0 (K/uL)    Eosinophils Relative 4  0 - 5 (%)    Eosinophils Absolute 0.3  0.0 - 0.7 (K/uL)    Basophils Relative 0  0 - 1 (%)    Basophils Absolute 0.0  0.0 - 0.1 (K/uL)   POCT I-STAT TROPONIN I     Status: Normal   Collection Time   02/27/11  8:08 PM      Component Value Range Comment   Troponin i, poc 0.00  0.00 - 0.08 (ng/mL)    Comment 3            D-DIMER, QUANTITATIVE     Status: Normal   Collection Time   02/27/11  9:17 PM      Component  Value Range Comment   D-Dimer, Quant 0.44  0.00 - 0.48 (ug/mL-FEU)   HEPATIC FUNCTION PANEL     Status: Abnormal   Collection Time   02/27/11  9:17 PM      Component Value Range Comment   Total Protein 7.4  6.0 - 8.3 (g/dL)    Albumin 3.9  3.5 - 5.2 (g/dL)    AST 82 (*) 0 - 37 (U/L)    ALT 140 (*) 0 - 53 (U/L)    Alkaline Phosphatase  71  39 - 117 (U/L)    Total Bilirubin 0.5  0.3 - 1.2 (mg/dL)    Bilirubin, Direct 0.1  0.0 - 0.3 (mg/dL) ICTERIC SPECIMEN   Indirect Bilirubin 0.4  0.3 - 0.9 (mg/dL)   CREATININE, SERUM     Status: Normal   Collection Time   02/28/11  3:00 AM      Component Value Range Comment   Creatinine, Ser 0.54  0.50 - 1.35 (mg/dL)    GFR calc non Af Amer >60  >60 (mL/min)    GFR calc Af Amer >60  >60 (mL/min)   LIPASE, BLOOD     Status: Normal   Collection Time   02/28/11  3:00 AM      Component Value Range Comment   Lipase 21  11 - 59 (U/L)   COMPREHENSIVE METABOLIC PANEL     Status: Abnormal   Collection Time   02/28/11  3:00 AM      Component Value Range Comment   Sodium 136  135 - 145 (mEq/L)    Potassium 3.2 (*) 3.5 - 5.1 (mEq/L)    Chloride 100  96 - 112 (mEq/L)    CO2 30  19 - 32 (mEq/L)    Glucose, Bld 157 (*) 70 - 99 (mg/dL)    BUN 7  6 - 23 (mg/dL)    Creatinine, Ser 8.11  0.50 - 1.35 (mg/dL)    Calcium 8.2 (*) 8.4 - 10.5 (mg/dL)    Total Protein 5.9 (*) 6.0 - 8.3 (g/dL)    Albumin 3.0 (*) 3.5 - 5.2 (g/dL)    AST 55 (*) 0 - 37 (U/L)    ALT 107 (*) 0 - 53 (U/L)    Alkaline Phosphatase 60  39 - 117 (U/L)    Total Bilirubin 0.5  0.3 - 1.2 (mg/dL)    GFR calc non Af Amer >60  >60 (mL/min)    GFR calc Af Amer >60  >60 (mL/min)   CARDIAC PANEL(CRET KIN+CKTOT+MB+TROPI)     Status: Normal   Collection Time   02/28/11  3:00 AM      Component Value Range Comment   Total CK 97  7 - 232 (U/L)    CK, MB 2.8  0.3 - 4.0 (ng/mL)    Troponin I <0.30  <0.30 (ng/mL)    Relative Index RELATIVE INDEX IS INVALID  0.0 - 2.5    LIPID PANEL     Status: Normal    Collection Time   02/28/11 11:00 AM      Component Value Range Comment   Cholesterol 143  0 - 200 (mg/dL)    Triglycerides 61  <914 (mg/dL)    HDL 51  >78 (mg/dL)    Total CHOL/HDL Ratio 2.8      VLDL 12  0 - 40 (mg/dL)    LDL Cholesterol 80  0 - 99 (mg/dL)   HEMOGLOBIN G9F     Status: Normal   Collection Time   02/28/11 11:00 AM      Component Value Range Comment   Hemoglobin A1C 5.4  <5.7 (%)    Mean Plasma Glucose 108  <117 (mg/dL)   CARDIAC PANEL(CRET KIN+CKTOT+MB+TROPI)     Status: Normal   Collection Time   02/28/11  3:39 PM      Component Value Range Comment   Total CK 79  7 - 232 (U/L)    CK, MB 2.4  0.3 - 4.0 (ng/mL)    Troponin I <0.30  <0.30 (  ng/mL)    Relative Index RELATIVE INDEX IS INVALID  0.0 - 2.5    CARDIAC PANEL(CRET KIN+CKTOT+MB+TROPI)     Status: Normal   Collection Time   02/28/11 11:34 PM      Component Value Range Comment   Total CK 66  7 - 232 (U/L)    CK, MB 2.2  0.3 - 4.0 (ng/mL)    Troponin I <0.30  <0.30 (ng/mL)    Relative Index RELATIVE INDEX IS INVALID  0.0 - 2.5        Hospital Course: This 55 year old man was admitted with chest pain. Please see initial history and physical examination. The patient's  cardiac enzymes were negative. It was felt that his pain was in fact likely to be gastroesophageal reflux disease. The symptoms have now completely resolved. He did have an ultrasound of his abdomen which was normal as was an echocardiogram which was unremarkable. He does have an alcohol intake of 2-3 beers per day.  Discharge Exam: Blood pressure 129/83, pulse 58, temperature 98.2 F (36.8 C), temperature source Oral, resp. rate 20, height 5\' 6"  (1.676 m), weight 70.308 kg (155 lb), SpO2 95.00%. He looks systemically well. Heart sounds are present and normal without pericardial rub. There is no gallop rhythm. Lung fields are clear without pleural rub. Abdomen is soft and nontender without any hepatosplenomegaly or any masses. Neurologically, he is  alert and orientated without any focal neurological signs.  Disposition: Home. He'll followup with his primary care physician Dr. Margo Aye in the next week or 2.  Discharge Orders    Future Orders Please Complete By Expires   Diet - low sodium heart healthy      Increase activity slowly           Signed: Jonanthony Nahar C 03/01/2011, 10:31 AM

## 2011-03-01 NOTE — Progress Notes (Signed)
Patient d/c home No c/o pain at d/c  Left floor via walking accompanied by staff Verbalized understanding of d/c instructions and follow up appts. Dillyn Menna [employer]

## 2011-03-10 NOTE — Progress Notes (Signed)
Encounter addended by: Clarene Critchley on: 03/10/2011 11:53 AM<BR>     Documentation filed: Flowsheet VN

## 2011-03-17 LAB — CBC
HCT: 51.8
Hemoglobin: 17.9 — ABNORMAL HIGH
MCHC: 34.5
MCV: 93.4
Platelets: 293
RBC: 5.55
RDW: 12.4
WBC: 10.3

## 2011-03-17 LAB — HEPATIC FUNCTION PANEL
ALT: 61 — ABNORMAL HIGH
AST: 32
Albumin: 3.7
Alkaline Phosphatase: 68
Bilirubin, Direct: 0.2
Indirect Bilirubin: 0.8
Total Bilirubin: 1
Total Protein: 6.9

## 2011-03-17 LAB — BASIC METABOLIC PANEL
BUN: 4 — ABNORMAL LOW
CO2: 25
Calcium: 9
Chloride: 105
Creatinine, Ser: 0.62
GFR calc Af Amer: >60
GFR calc non Af Amer: >60
Glucose, Bld: 87
Potassium: 4.6
Sodium: 137

## 2012-11-26 ENCOUNTER — Other Ambulatory Visit (HOSPITAL_COMMUNITY): Payer: Self-pay | Admitting: Internal Medicine

## 2012-11-26 DIAGNOSIS — R1031 Right lower quadrant pain: Secondary | ICD-10-CM

## 2012-11-29 ENCOUNTER — Ambulatory Visit (HOSPITAL_COMMUNITY)
Admission: RE | Admit: 2012-11-29 | Discharge: 2012-11-29 | Disposition: A | Discharge status: Home / Self Care | Payer: BC Managed Care – PPO | Source: Ambulatory Visit | Attending: Internal Medicine | Admitting: Internal Medicine

## 2012-11-29 DIAGNOSIS — K5730 Diverticulosis of large intestine without perforation or abscess without bleeding: Secondary | ICD-10-CM | POA: Insufficient documentation

## 2012-11-29 DIAGNOSIS — R109 Unspecified abdominal pain: Secondary | ICD-10-CM | POA: Insufficient documentation

## 2012-11-29 DIAGNOSIS — R1031 Right lower quadrant pain: Secondary | ICD-10-CM

## 2012-11-29 MED ORDER — IOHEXOL 300 MG/ML  SOLN
100.0000 mL | Freq: Once | INTRAMUSCULAR | Status: AC | PRN
  Administered 2012-11-29: 100 mL via INTRAVENOUS

## 2012-12-04 ENCOUNTER — Encounter (INDEPENDENT_AMBULATORY_CARE_PROVIDER_SITE_OTHER): Payer: Self-pay | Admitting: *Deleted

## 2012-12-11 ENCOUNTER — Encounter (INDEPENDENT_AMBULATORY_CARE_PROVIDER_SITE_OTHER): Payer: Self-pay | Admitting: *Deleted

## 2013-01-06 ENCOUNTER — Ambulatory Visit (INDEPENDENT_AMBULATORY_CARE_PROVIDER_SITE_OTHER): Payer: BC Managed Care – PPO | Admitting: Internal Medicine

## 2013-01-06 ENCOUNTER — Encounter (INDEPENDENT_AMBULATORY_CARE_PROVIDER_SITE_OTHER): Payer: Self-pay | Admitting: Internal Medicine

## 2013-01-06 VITALS — BP 108/68 | HR 64 | Temp 97.7°F | Ht 66.0 in | Wt 157.2 lb

## 2013-01-06 DIAGNOSIS — R768 Other specified abnormal immunological findings in serum: Secondary | ICD-10-CM

## 2013-01-06 DIAGNOSIS — R894 Abnormal immunological findings in specimens from other organs, systems and tissues: Secondary | ICD-10-CM

## 2013-01-06 DIAGNOSIS — K732 Chronic active hepatitis, not elsewhere classified: Secondary | ICD-10-CM | POA: Insufficient documentation

## 2013-01-06 DIAGNOSIS — K738 Other chronic hepatitis, not elsewhere classified: Secondary | ICD-10-CM

## 2013-01-06 NOTE — Progress Notes (Signed)
Subjective:     Patient ID: Micheal Serrano, male   DOB: 1956-01-06, 57 y.o.   MRN: [id number]  HPI Referred to our office for left sided diverticula. He has never undergone a colonoscopy. He tells me he has rt flank pain.  He has never undergone a colonoscopy. He tell me his father had hx of polyps. Appetite is good. No weight loss. No abdominal pain. He usually has a BM one a day. No melena or bright red rectal bleeding. Sometimes he has acid reflux. He was diagnosed 10 and 12 yrs with Hepatitis C with an insurance exam. He is Genotype 1. No tattoos or IV drug use.   11/26/2012 ALP 58, AST 58, ALT 121 11/26/2012 H and H 18.4 and 52.7, MCV 92.5  11/16/2012 CT abdomen/pelvis with CM: rt lower quadrant pain and flank pain  :  No acute findings.  No abnormalities of the kidneys, ureters or bladder. A normal  appendix is visualized.  There are no findings to explain right flank and right lower  quadrant pain.  Left colon diverticula. No diverticulitis.  08/13/2004 Liver biopsy: FINAL DIAGNOSIS  MICROSCOPIC EXAMINATION AND DIAGNOSIS  LIVER, NEEDLE BIOPSIES: - CHRONIC HEPATITIS, MILD ACTIVITY (INFLAMMATORY GRADE II) WITH NO INCREASE IN FIBROSIS (STAGE 0). - FINDINGS CONSISTENT WITH HEPATITIS C VIRUS INFECTION.   Review of Systems see hpi Current Outpatient Prescriptions  Medication Sig Dispense Refill  . olmesartan (BENICAR) 20 MG tablet Take 40 mg by mouth daily.        No current facility-administered medications for this visit.   Past Medical History  Diagnosis Date  . Hypertension   . GERD (gastroesophageal reflux disease)   . Hepatitis C    Past Surgical History  Procedure Laterality Date  . Cholecystectomy    . Knee arthroscopy    . Hernia repair     No Known Allergies      Objective:   Physical Exam Filed Vitals:   01/06/13 1516  BP: 108/68  Pulse: 64  Temp: 97.7 F (36.5 C)  Height: 5\' 6"  (1.676 m)  Weight: 157 lb 3.2 oz (71.305 kg)  Alert and  oriented. Skin warm and dry. Oral mucosa is moist.   . Sclera anicteric, conjunctivae is pink. Thyroid not enlarged. No cervical lymphadenopathy. Lungs clear. Heart regular rate and rhythm.  Abdomen is soft. Bowel sounds are positive. No hepatomegaly. No abdominal masses felt. No tenderness.  No edema to lower extremities.       Assessment:    Rt flank pain ? Etiology.  Occasionally rt lower quadrant pain. CT did not reveal any acute process. No pain today. Patient has never undergone a colonoscopy in the past. Colonic neoplasm needs to be ruled out. Hx of Hepatitis C diagnosed 10-12 yrs ago. No risk factors. He has mild disease based on liver biopsy in 2006.    Plan:    Screening colonoscopy with Dr. Karilyn Cota.   Labs CMET,   Hep B antigen, AFP, PT/INR, TSH,   and Quaintative

## 2013-01-06 NOTE — Patient Instructions (Addendum)
Lab for Hep C. Colonoscopy with Dr. Karilyn Cota

## 2013-01-07 LAB — COMPREHENSIVE METABOLIC PANEL
BUN: 8 mg/dL (ref 6–23)
CO2: 26 mEq/L (ref 19–32)
Glucose, Bld: 73 mg/dL (ref 70–99)
Sodium: 140 mEq/L (ref 135–145)
Total Bilirubin: 0.8 mg/dL (ref 0.3–1.2)
Total Protein: 7.7 g/dL (ref 6.0–8.3)

## 2013-01-07 LAB — PROTIME-INR
INR: 0.97 (ref <1.50)
Prothrombin Time: 12.9 seconds (ref 11.6–15.2)

## 2013-01-07 LAB — HEPATITIS C ANTIBODY: HCV Ab: REACTIVE — AB

## 2013-01-07 LAB — HEPATITIS B SURFACE ANTIGEN: Hepatitis B Surface Ag: NEGATIVE

## 2013-01-08 ENCOUNTER — Telehealth (INDEPENDENT_AMBULATORY_CARE_PROVIDER_SITE_OTHER): Payer: Self-pay | Admitting: *Deleted

## 2013-01-08 ENCOUNTER — Other Ambulatory Visit (INDEPENDENT_AMBULATORY_CARE_PROVIDER_SITE_OTHER): Payer: Self-pay | Admitting: *Deleted

## 2013-01-08 DIAGNOSIS — Z1211 Encounter for screening for malignant neoplasm of colon: Secondary | ICD-10-CM

## 2013-01-08 LAB — HEPATITIS C RNA QUANTITATIVE: HCV Quantitative: 2722592 IU/mL — ABNORMAL HIGH (ref <15)

## 2013-01-08 NOTE — Telephone Encounter (Signed)
Patient needs movi prep 

## 2013-01-10 MED ORDER — PEG-KCL-NACL-NASULF-NA ASC-C 100 G PO SOLR: 1.0000 | Freq: Once | ORAL | Status: DC

## 2013-01-13 ENCOUNTER — Encounter (HOSPITAL_COMMUNITY): Payer: Self-pay | Admitting: Pharmacy Technician

## 2013-01-27 ENCOUNTER — Encounter (INDEPENDENT_AMBULATORY_CARE_PROVIDER_SITE_OTHER): Payer: Self-pay

## 2013-01-28 DIAGNOSIS — Z0271 Encounter for disability determination: Secondary | ICD-10-CM

## 2013-01-30 ENCOUNTER — Encounter (HOSPITAL_COMMUNITY)
Admission: RE | Disposition: A | Discharge status: Home / Self Care | Payer: Self-pay | Source: Ambulatory Visit | Attending: Internal Medicine

## 2013-01-30 ENCOUNTER — Ambulatory Visit (HOSPITAL_COMMUNITY)
Admission: RE | Admit: 2013-01-30 | Discharge: 2013-01-30 | Disposition: A | Discharge status: Home / Self Care | Payer: BC Managed Care – PPO | Source: Ambulatory Visit | Attending: Internal Medicine | Admitting: Internal Medicine

## 2013-01-30 ENCOUNTER — Encounter (HOSPITAL_COMMUNITY): Payer: Self-pay | Admitting: *Deleted

## 2013-01-30 DIAGNOSIS — Z1211 Encounter for screening for malignant neoplasm of colon: Secondary | ICD-10-CM

## 2013-01-30 DIAGNOSIS — Z79899 Other long term (current) drug therapy: Secondary | ICD-10-CM | POA: Insufficient documentation

## 2013-01-30 DIAGNOSIS — K644 Residual hemorrhoidal skin tags: Secondary | ICD-10-CM | POA: Insufficient documentation

## 2013-01-30 DIAGNOSIS — K5730 Diverticulosis of large intestine without perforation or abscess without bleeding: Secondary | ICD-10-CM

## 2013-01-30 DIAGNOSIS — K645 Perianal venous thrombosis: Secondary | ICD-10-CM

## 2013-01-30 DIAGNOSIS — K219 Gastro-esophageal reflux disease without esophagitis: Secondary | ICD-10-CM | POA: Insufficient documentation

## 2013-01-30 DIAGNOSIS — Z8371 Family history of colonic polyps: Secondary | ICD-10-CM | POA: Insufficient documentation

## 2013-01-30 DIAGNOSIS — D126 Benign neoplasm of colon, unspecified: Secondary | ICD-10-CM | POA: Insufficient documentation

## 2013-01-30 DIAGNOSIS — I10 Essential (primary) hypertension: Secondary | ICD-10-CM | POA: Insufficient documentation

## 2013-01-30 DIAGNOSIS — Z83719 Family history of colon polyps, unspecified: Secondary | ICD-10-CM | POA: Insufficient documentation

## 2013-01-30 DIAGNOSIS — B1920 Unspecified viral hepatitis C without hepatic coma: Secondary | ICD-10-CM | POA: Insufficient documentation

## 2013-01-30 HISTORY — PX: COLONOSCOPY: SHX5424

## 2013-01-30 SURGERY — COLONOSCOPY
Anesthesia: Moderate Sedation

## 2013-01-30 MED ORDER — SODIUM CHLORIDE 0.9 % IV SOLN
INTRAVENOUS | Status: DC
  Administered 2013-01-30: 12:00:00 via INTRAVENOUS

## 2013-01-30 MED ORDER — MIDAZOLAM HCL 5 MG/5ML IJ SOLN
INTRAMUSCULAR | Status: DC | PRN
  Administered 2013-01-30 (×4): 2 mg via INTRAVENOUS

## 2013-01-30 MED ORDER — MEPERIDINE HCL 50 MG/ML IJ SOLN
INTRAMUSCULAR | Status: AC
  Filled 2013-01-30: qty 1

## 2013-01-30 MED ORDER — MEPERIDINE HCL 50 MG/ML IJ SOLN
INTRAMUSCULAR | Status: DC | PRN
  Administered 2013-01-30 (×2): 25 mg via INTRAVENOUS

## 2013-01-30 MED ORDER — MIDAZOLAM HCL 5 MG/5ML IJ SOLN
INTRAMUSCULAR | Status: AC
  Filled 2013-01-30: qty 10

## 2013-01-30 MED ORDER — STERILE WATER FOR IRRIGATION IR SOLN
Status: DC | PRN
  Administered 2013-01-30: 13:00:00

## 2013-01-30 NOTE — H&P (Addendum)
Micheal Serrano is an 57 y.o. male.   Chief Complaint: Patient is here for colonoscopy. HPI: Patient is 57 year old Caucasian male who is here for average risk screening colonoscopy. He denies rectal bleeding change in his bowel habits or abdominal pain. He does complain of right flank pain which he's had for years and is constant. It is CT done 2 months ago and another CT but 4 years ago and no cause found for his pain. Family history is negative for colorectal carcinoma and his father had colonic polyps.   Past Medical History  Diagnosis Date  . Hypertension   . GERD (gastroesophageal reflux disease)   . Hepatitis C     Past Surgical History  Procedure Laterality Date  . Cholecystectomy    . Knee arthroscopy    . Hernia repair      Family History  Problem Relation Age of Onset  . Colon cancer Neg Hx    Social History:  reports that he has been smoking.  He does not have any smokeless tobacco history on file. He reports that  drinks alcohol. He reports that he does not use illicit drugs.  Allergies: No Known Allergies  Medications Prior to Admission  Medication Sig Dispense Refill  . olmesartan (BENICAR) 40 MG tablet Take 40 mg by mouth daily.        No results found for this or any previous visit (from the past 48 hour(s)). No results found.  ROS  Blood pressure 124/85, pulse 82, temperature 97.6 F (36.4 C), temperature source Oral, resp. rate 17, height 5\' 6"  (1.676 m), weight 157 lb (71.215 kg), SpO2 97.00%. Physical Exam  Constitutional: He appears well-developed and well-nourished.  HENT:  Mouth/Throat: Oropharynx is clear and moist.  Eyes: Conjunctivae are normal. No scleral icterus.  Neck: No thyromegaly present.  Cardiovascular: Normal rate, regular rhythm and normal heart sounds.   No murmur heard. Respiratory: Effort normal and breath sounds normal.  GI: Soft. He exhibits no distension and no mass. There is no tenderness.  Musculoskeletal: He exhibits no  edema.  Lymphadenopathy:    He has no cervical adenopathy.  Neurological: He is alert.  Skin: Skin is warm and dry.     Assessment/Plan Average risk screening colonoscopy.  REHMAN,NAJEEB U 01/30/2013, 12:54 PM

## 2013-01-30 NOTE — Op Note (Signed)
COLONOSCOPY PROCEDURE REPORT  PATIENT:  Micheal Serrano  MR#:  [id number] Birthdate:  05/10/1956, 57 y.o., male Endoscopist:  Dr. Malissa Hippo, MD Referred By:  Dr. Dwana Melena, MD Procedure Date: 01/30/2013  Procedure:   Colonoscopy  Indications:  Patient is 57 year old Caucasian male who is undergoing average risk screening colonoscopy. Family history is negative for CRC his father had colonic polyps.  Informed Consent:  The procedure and risks were reviewed with the patient and informed consent was obtained.  Medications:  Demerol 50 mg IV Versed 8 mg IV  Description of procedure:  After a digital rectal exam was performed, that colonoscope was advanced from the anus through the rectum and colon to the area of the cecum, ileocecal valve and appendiceal orifice. The cecum was deeply intubated. These structures were well-seen and photographed for the record. From the level of the cecum and ileocecal valve, the scope was slowly and cautiously withdrawn. The mucosal surfaces were carefully surveyed utilizing scope tip to flexion to facilitate fold flattening as needed. The scope was pulled down into the rectum where a thorough exam including retroflexion was performed.  Findings:   Prep satisfactory. Two small polyps ablated via cold biopsy and submitted together. These were located at hepatic flexure and sigmoid colon. Few small diverticula at sigmoid colon. Small hemorrhoids below the dentate line.    Therapeutic/Diagnostic Maneuvers Performed:  See above  Complications:  None  Cecal Withdrawal Time:  12 minutes  Impression:  Examination performed to cecum. Two small polyps ablated via cold biopsy and submitted together. Mild sigmoid colon diverticulosis.  Recommendations:  Standard instructions given. I will contact patient with biopsy results and further recommendations.  REHMAN,NAJEEB U  01/30/2013 1:27 PM  CC: Dr. Catalina Pizza, MD & Dr. Bonnetta Barry ref. provider  found

## 2013-01-31 ENCOUNTER — Encounter (HOSPITAL_COMMUNITY): Payer: Self-pay | Admitting: Internal Medicine

## 2013-02-12 ENCOUNTER — Encounter (INDEPENDENT_AMBULATORY_CARE_PROVIDER_SITE_OTHER): Payer: Self-pay | Admitting: *Deleted

## 2013-02-25 ENCOUNTER — Encounter (INDEPENDENT_AMBULATORY_CARE_PROVIDER_SITE_OTHER): Payer: Self-pay | Admitting: *Deleted

## 2013-04-07 ENCOUNTER — Ambulatory Visit (INDEPENDENT_AMBULATORY_CARE_PROVIDER_SITE_OTHER): Payer: BC Managed Care – PPO | Admitting: Internal Medicine

## 2013-04-17 ENCOUNTER — Telehealth (INDEPENDENT_AMBULATORY_CARE_PROVIDER_SITE_OTHER): Payer: Self-pay | Admitting: *Deleted

## 2013-04-17 NOTE — Telephone Encounter (Signed)
I have followed up this morning on Micheal Serrano. He had not heard from [employer] as of yet. I called [employer] and spoke with Gearldine Bienenstock, I resent the order for the [facility] and she is going to call the patient today and the medication will be sent to the patient. He has been advised to call office so that he can have office visit per NUR.

## 2013-04-25 ENCOUNTER — Encounter (INDEPENDENT_AMBULATORY_CARE_PROVIDER_SITE_OTHER): Payer: Self-pay | Admitting: Internal Medicine

## 2013-04-25 ENCOUNTER — Ambulatory Visit (HOSPITAL_COMMUNITY)
Admission: RE | Admit: 2013-04-25 | Discharge: 2013-04-25 | Disposition: A | Discharge status: Home / Self Care | Payer: BC Managed Care – PPO | Source: Ambulatory Visit | Attending: Internal Medicine | Admitting: Internal Medicine

## 2013-04-25 ENCOUNTER — Ambulatory Visit (INDEPENDENT_AMBULATORY_CARE_PROVIDER_SITE_OTHER): Payer: BC Managed Care – PPO | Admitting: Internal Medicine

## 2013-04-25 VITALS — BP 130/80 | HR 78 | Temp 96.7°F | Resp 18 | Ht 66.0 in | Wt 155.1 lb

## 2013-04-25 DIAGNOSIS — Z9089 Acquired absence of other organs: Secondary | ICD-10-CM | POA: Insufficient documentation

## 2013-04-25 DIAGNOSIS — B182 Chronic viral hepatitis C: Secondary | ICD-10-CM

## 2013-04-25 DIAGNOSIS — K7460 Unspecified cirrhosis of liver: Secondary | ICD-10-CM | POA: Insufficient documentation

## 2013-04-25 NOTE — Progress Notes (Signed)
Presenting complaint;  Patient is here to discuss treatment of chronic hepatitis C.  Subjective:  Patient is 57 year old Caucasian male who has chronic hepatitis C. genotype 1B and underwent liver biopsy in 2006 revealing grade 2 and stage 0 disease. He was advised to delay therapy until he has more treatment options. He was seen back in July and had baseline blood work. He also underwent colonoscopy on 01/30/2013 revealing 2 small tubular adenomas and he was advised repeat exam in 7 years. He was given prescription for Harvoni and he has received this medication and is anxious to get on with treatment. He has good appetite. He denies nausea vomiting or abdominal pain. His only symptom is one of fatigue. He is working full-time and drops to [employer] daily. He drinks no more than one to 2 cans of beer per day.   Current Medications: Current Outpatient Prescriptions  Medication Sig Dispense Refill  . olmesartan (BENICAR) 40 MG tablet Take 40 mg by mouth daily.      Marland Kitchen PRESCRIPTION MEDICATION Harvoni - take 1 by mouth daily       No current facility-administered medications for this visit.     Objective: Blood pressure 130/80, pulse 78, temperature 96.7 F (35.9 C), temperature source Oral, resp. rate 18, height 5\' 6"  (1.676 m), weight 155 lb 1.6 oz (70.353 kg). Patient is alert and in no acute distress. Conjunctiva is pink. Sclera is nonicteric Oropharyngeal mucosa is normal. No neck masses or thyromegaly noted. Cardiac exam with regular rhythm normal S1 and S2. No murmur or gallop noted. Lungs are clear to auscultation. Abdomen symmetrical soft and nontender. No organomegaly or masses noted. By percussion his liver is 13-14 cm. No LE edema or clubbing noted.  Labs/studies Results: Lab data from 01/06/2013 HCV quantitative [phone] IU/mL HCV quantitative log 6.43 HCV genotype 1b Bilirubin 0.8, AP 53, AST 59, ALT 1:15, total protein 7.7 albumin 4.4.  Assessment:  #1.  Chronic hepatitis C genotype 1B disease. His disease was last staged in March 2006 when he had liver biopsy revealing grade 2 and stage 0 disease. Ultrasound in September 2012 did not show any changes of cirrhosis. He needs to be restaged noninvasively which will determine the duration of treatment.    Plan:  Upper abdominal ultrasound today along with elastography. Patient advised to quit drinking alcohol altogether if he cannot he should keep calling take to no more than 2 drinks per day. Begin Harvoni 1 tablet by mouth daily. He is aware not to take this medication with antacids. He will have CBC, LFTs and HCVRNA in 4 [place]. Office visit in 12 [place].    Addendum; Abdominal ultrasound is within normal limits except evidence of cholecystectomy. Elastography score is at upper limit of normal. Therefore he does not have cirrhosis and her need 12 [place] therapy.

## 2013-04-25 NOTE — Patient Instructions (Signed)
Physician will contact her with results of ultrasound 

## 2013-04-28 ENCOUNTER — Ambulatory Visit (HOSPITAL_COMMUNITY): Payer: BC Managed Care – PPO

## 2013-05-16 ENCOUNTER — Other Ambulatory Visit (HOSPITAL_COMMUNITY): Payer: Self-pay | Admitting: Internal Medicine

## 2013-05-16 ENCOUNTER — Encounter (INDEPENDENT_AMBULATORY_CARE_PROVIDER_SITE_OTHER): Payer: Self-pay | Admitting: *Deleted

## 2013-05-16 ENCOUNTER — Telehealth (INDEPENDENT_AMBULATORY_CARE_PROVIDER_SITE_OTHER): Payer: Self-pay | Admitting: *Deleted

## 2013-05-16 DIAGNOSIS — M5410 Radiculopathy, site unspecified: Secondary | ICD-10-CM

## 2013-05-16 DIAGNOSIS — B1920 Unspecified viral hepatitis C without hepatic coma: Secondary | ICD-10-CM

## 2013-05-16 NOTE — Telephone Encounter (Signed)
Patient was seen in the office on 04/28/13. He was ask to let me know the start date of the Harvoni, at the visit he spoke of starting it that day,then Saturday and went on to mention Monday. On 05/15/13 and today I have called and left voice messages on his and Chere's cell phones asking that they please contact me with the start date. I am going to go ahead a send lab request to [facility] for the following that are due in 4 [place] per Dr.Rehman after starting that treatment: CBC, HCV RNA Quant, Hepatic Profile. The patient is to also have a office visit in 12 [place] post stating treatment.

## 2013-05-19 ENCOUNTER — Other Ambulatory Visit (HOSPITAL_COMMUNITY): Payer: Self-pay | Admitting: Internal Medicine

## 2013-05-19 ENCOUNTER — Ambulatory Visit (HOSPITAL_COMMUNITY)
Admission: RE | Admit: 2013-05-19 | Discharge: 2013-05-19 | Disposition: A | Discharge status: Home / Self Care | Payer: BC Managed Care – PPO | Source: Ambulatory Visit | Attending: Internal Medicine | Admitting: Internal Medicine

## 2013-05-19 ENCOUNTER — Telehealth (INDEPENDENT_AMBULATORY_CARE_PROVIDER_SITE_OTHER): Payer: Self-pay | Admitting: *Deleted

## 2013-05-19 DIAGNOSIS — M4802 Spinal stenosis, cervical region: Secondary | ICD-10-CM | POA: Insufficient documentation

## 2013-05-19 DIAGNOSIS — M5410 Radiculopathy, site unspecified: Secondary | ICD-10-CM

## 2013-05-19 DIAGNOSIS — M5020 Other cervical disc displacement, unspecified cervical region: Secondary | ICD-10-CM | POA: Insufficient documentation

## 2013-05-19 DIAGNOSIS — M47812 Spondylosis without myelopathy or radiculopathy, cervical region: Secondary | ICD-10-CM | POA: Insufficient documentation

## 2013-05-19 DIAGNOSIS — M542 Cervicalgia: Secondary | ICD-10-CM | POA: Insufficient documentation

## 2013-05-19 DIAGNOSIS — M5030 Other cervical disc degeneration, unspecified cervical region: Secondary | ICD-10-CM | POA: Insufficient documentation

## 2013-05-19 NOTE — Telephone Encounter (Signed)
Micheal Serrano presented to the office today. He states that he started his Harvoni on Monday,November 16 th,2014. Patient will have lab work in 4 [place], December 15,2014. Office visit with Dr.Rehman at [address] [place] post starting the [facility] , February 16 th 2015.

## 2013-06-03 LAB — CBC
MCH: 33.1 pg (ref 26.0–34.0)
MCHC: 35.4 g/dL (ref 30.0–36.0)
Platelets: 216 10*3/uL (ref 150–400)

## 2013-06-03 LAB — HEPATIC FUNCTION PANEL
ALT: 16 U/L (ref 0–53)
Indirect Bilirubin: 0.5 mg/dL (ref 0.0–0.9)
Total Protein: 6.6 g/dL (ref 6.0–8.3)

## 2013-06-06 LAB — HEPATITIS C RNA QUANTITATIVE: HCV Quantitative: NOT DETECTED IU/mL (ref <15)

## 2013-06-10 ENCOUNTER — Telehealth (INDEPENDENT_AMBULATORY_CARE_PROVIDER_SITE_OTHER): Payer: Self-pay | Admitting: *Deleted

## 2013-06-10 NOTE — Telephone Encounter (Signed)
I called [employer] to do the Prior Authorization prior to 06/12/13. The representative could not access the system to do the PA, stated it was to soon. She documented that I had called and would call on or after 06-16-13 , to get this done. Micheal Serrano was called and made aware and states that he has enough medication for at least next week and feels that all he needs is 1 more month. Patient will be called once addressed with [employer].

## 2013-06-12 HISTORY — PX: CERVICAL FUSION: SHX112

## 2013-06-17 ENCOUNTER — Telehealth (INDEPENDENT_AMBULATORY_CARE_PROVIDER_SITE_OTHER): Payer: Self-pay | Admitting: *Deleted

## 2013-06-17 DIAGNOSIS — M5030 Other cervical disc degeneration, unspecified cervical region: Secondary | ICD-10-CM | POA: Insufficient documentation

## 2013-06-17 NOTE — Telephone Encounter (Signed)
Per Dr.Rehman the use of the medication Harvoni should not have any side effects related to the upcoming surgery. He may take Harvoni as directed.  Patient called and made aware.

## 2013-06-17 NOTE — Telephone Encounter (Signed)
Edrian will be having ACDF surgery on 06/25/13. He and Chere is needing to know with him being on harvoni, will it be okay for a 3 hours surgery, can he take his meds before and after surger, is it okay to be put to sleep, any problems with wound healing and any concern with bleeding or possible infection. Chere's return phone number is [phone].

## 2013-06-24 ENCOUNTER — Telehealth (INDEPENDENT_AMBULATORY_CARE_PROVIDER_SITE_OTHER): Payer: Self-pay | Admitting: *Deleted

## 2013-06-24 NOTE — Telephone Encounter (Signed)
Micheal Serrano this is a clinical issue and I will need your help please.  Case #[id number]  Pharmacy # [phone]

## 2013-06-24 NOTE — Telephone Encounter (Signed)
[phone] Micheal Serrano needs to speak with Micheal Serrano is holding his Harvoni medication because they are needing additional information. The office needs to call [phone] - having surgery and will be out of the medication looking right now for 4 days. Per Dr. Laural Golden, he should be okay but hard to tell. Have Micheal Serrano call Micheal Serrano and see what is going on. Have them ship meds overnight and if additional information is needed to call Micheal Serrano -  [facility] help desk  same case number

## 2013-06-25 NOTE — Telephone Encounter (Signed)
This has been adressed 

## 2013-06-30 ENCOUNTER — Telehealth (INDEPENDENT_AMBULATORY_CARE_PROVIDER_SITE_OTHER): Payer: Self-pay | Admitting: *Deleted

## 2013-06-30 NOTE — Telephone Encounter (Signed)
Patient presented to office prior to January with a letter stating that his insurance was going to change and we would need to contact his pharmacy,([facility]). I was told that he would have to have Vikera the new Hep C medication. I explained that this was unacceptable that he had been on this treatment since 04/27/13 and was responding. Hep C was negative. I did so, the young lady that assisted me went through everything only to find out at the end that she was not going to be able to complete the form. She ask that I call back the following week and assured me that the patient would not be affected, no problems with him getting his Harvoni. I called,(06-20-13) back and spoke with Clifton James, he explained the same thing, however he started a new application. He told be that it was approved , providing the following confirmation # [id number]. He did try to do something and ask his supervisor for assistance. He told me that he would call me back that there was something that they needed to work on.  On Monday 06-23-13 I   was out sick. Patient's wife called and stated that there was a problem with the medication. Deberah Castle, NP called the insurance company spoke with Eudelia Bunch, went over everything, she was told that it had been approved and provided this confirmation number #[id number] and that  The medication would be over nighted to the patient.  Today, we rec'd a call from patient's wife who had the insurance company on another phone. They had told the wife that they could not release the medication until some questions were answered. Nilda Calamity form [employer] called me and told me the same thing. I explained that this was totally unacceptable, went through all of the above noted. He transferred me to pharmacy where I spoke with Davy Pique, pharmacy tech. She states that they had ask for additional information, I provided this for her. She states to me that though we provide all of this information to [employer], they do not transer it to the pharmacy.I suggested They work that out as it is not the patient nor our office's problem . That this was not acceptable! Davy Pique states that she will get this information to the pharmacist and they will contact the patient. I said that this needs to be over nighted tonight. A number was provided for the patient to call a little later for progress of shipment of medication. Wife called and made aware and she will call us back.

## 2013-07-01 NOTE — Telephone Encounter (Signed)
On 06-30-13 in the afternoon we rec'd yet another call from patient's insurance. It was a conference call, to have included , Vickie, another lady , [name], and myself.  We were told that a 3 rd party was going to have to do an appeal. We explained that this had been approved 3 times , we went over again ,all that is noted. A representative, Debbie from the appeals department came on the line,along with Jocelyn Lamer. She stated to [name] and myself, that an appeal could not be done as the medication had been approved. That the problem appeared to be within the pharmacy. She along with the pharmacist was looking at this , by this time it was 2 pm her time and 5 pm our time. She states that she realizes the time and that she would call [name] back that day,07-01-13 ,and me fiirst thing on 07-01-13 with their findings. As of yet , we have heard nothing, we have however, rec'd a PA request for the Opp. We will complete this and fax it in. The phone call was very long, nad note that the patient took his last pill on hand , 06-24-13.Start date was 04/27/14  For [address] [place], EOT 07-20-13.

## 2013-07-02 NOTE — Telephone Encounter (Signed)
We completed another form today for the [facility] and sent patient information to once again support our stand for this medication. As of now, I have heard nothing from [employer]. I have called patient and he shares that he has rec'd 3 calls from the company and he has been assured that he will have his medication tomorrow,07-03-13. He will contact our office once he has the medication.

## 2013-07-03 ENCOUNTER — Telehealth (INDEPENDENT_AMBULATORY_CARE_PROVIDER_SITE_OTHER): Payer: Self-pay | Admitting: *Deleted

## 2013-07-03 DIAGNOSIS — B1920 Unspecified viral hepatitis C without hepatic coma: Secondary | ICD-10-CM

## 2013-07-03 NOTE — Telephone Encounter (Signed)
Patient called the office today ,he rec'd his medication,(Harvoni) Patient took his last dose on hand 06-24-12. He is 1 week 1-2 days off schedule. Currently has an appointment for 07-28-13. Plan is to talk with Dr.Rehman and see if we need to change the appointment and also get the lab work. Patient will be made aware.

## 2013-07-28 ENCOUNTER — Ambulatory Visit (INDEPENDENT_AMBULATORY_CARE_PROVIDER_SITE_OTHER): Payer: BC Managed Care – PPO | Admitting: Internal Medicine

## 2013-08-01 NOTE — Telephone Encounter (Signed)
Patient called and made aware that lab orders had been faxed to [facility].

## 2013-08-04 LAB — HEPATIC FUNCTION PANEL
ALT: 43 U/L (ref 0–53)
AST: 31 U/L (ref 0–37)
Albumin: 3.8 g/dL (ref 3.5–5.2)
Alkaline Phosphatase: 58 U/L (ref 39–117)
BILIRUBIN DIRECT: 0.2 mg/dL (ref 0.0–0.3)
BILIRUBIN INDIRECT: 0.4 mg/dL (ref 0.2–1.2)
TOTAL PROTEIN: 6.7 g/dL (ref 6.0–8.3)
Total Bilirubin: 0.6 mg/dL (ref 0.2–1.2)

## 2013-08-04 LAB — CBC
HEMATOCRIT: 49.5 % (ref 39.0–52.0)
HEMOGLOBIN: 17.6 g/dL — AB (ref 13.0–17.0)
MCH: 32.7 pg (ref 26.0–34.0)
MCHC: 35.6 g/dL (ref 30.0–36.0)
MCV: 91.8 fL (ref 78.0–100.0)
Platelets: 248 10*3/uL (ref 150–400)
RBC: 5.39 MIL/uL (ref 4.22–5.81)
RDW: 13.8 % (ref 11.5–15.5)
WBC: 8.4 10*3/uL (ref 4.0–10.5)

## 2013-08-05 ENCOUNTER — Ambulatory Visit (INDEPENDENT_AMBULATORY_CARE_PROVIDER_SITE_OTHER): Payer: BC Managed Care – PPO | Admitting: Internal Medicine

## 2013-08-05 ENCOUNTER — Encounter (INDEPENDENT_AMBULATORY_CARE_PROVIDER_SITE_OTHER): Payer: Self-pay | Admitting: Internal Medicine

## 2013-08-05 VITALS — BP 124/80 | HR 74 | Temp 97.0°F | Resp 18 | Ht 66.0 in | Wt 155.3 lb

## 2013-08-05 DIAGNOSIS — B1920 Unspecified viral hepatitis C without hepatic coma: Secondary | ICD-10-CM

## 2013-08-05 LAB — HEPATITIS C RNA QUANTITATIVE: HCV Quantitative: NOT DETECTED IU/mL (ref <15)

## 2013-08-05 NOTE — Patient Instructions (Signed)
Physician will contact you with results of pending blood tests

## 2013-08-05 NOTE — Progress Notes (Signed)
Presenting complaint;  Followup regarding chronic hepatitis C.  Subjective:  Micheal Serrano is a 58 year old Caucasian male who has history of chronic hepatitis C genotype 1b who was last seen on 04/25/2013 and begun on Harvoni and he took last dose on 08/02/2013. He states he had very few side effects. He had headache for one day after second dose, some insomnia and fatigue. He has maintained his appetite and his weight has been stable. He underwent neck surgery with fusion at C5,6 and 7 on 06/18/2013 and had no problems. He is trying his best to quit cigarette smoking. He has had a few drinks of alcohol since I last saw him.   Current Medications: Current Outpatient Prescriptions  Medication Sig Dispense Refill  . olmesartan (BENICAR) 40 MG tablet Take 40 mg by mouth daily.      Marland Kitchen PRESCRIPTION MEDICATION Harvoni - take 1 by mouth daily       No current facility-administered medications for this visit.     Objective: Blood pressure 124/80, pulse 74, temperature 97 F (36.1 C), temperature source Oral, resp. rate 18, height 5\' 6"  (1.676 m), weight 155 lb 4.8 oz (70.444 kg). Patient is alert and in no acute distress. Conjunctiva is pink. Sclera is nonicteric Oropharyngeal mucosa is normal. No neck masses or thyromegaly noted. Cardiac exam with regular rhythm normal S1 and S2. No murmur or gallop noted. Lungs are clear to auscultation. Abdomen symmetrical soft and nontender without organomegaly or masses.  No LE edema or clubbing noted.  Labs/studies Results: Lab data from 08/04/2013. WBC 8.4 H&H 17.6 and 49.5 and platelet count 248K Bilirubin 0.6, AP 58, AST 31, ALT 43 and albumin 3.8 HCVRNA pending.     Assessment:  #1. Chronic hepatitis C. Patient has completed 12 [place] of Harvoni which he tolerated with minimal SEs. His transaminases remain normal. This almost and given that he will have SVR given that he has 1b disease without cirrhosis. #2. Hemoglobin at upper limit of normal  secondary to chronic smoking.  Plan:  Patient will be called with results of HCVRNA when completed. Next blood work would be an 3-4 months to include HCVRNA by PCR and LFTs. Office visit in one year.

## 2013-08-08 ENCOUNTER — Telehealth (INDEPENDENT_AMBULATORY_CARE_PROVIDER_SITE_OTHER): Payer: Self-pay | Admitting: *Deleted

## 2013-08-08 DIAGNOSIS — B1920 Unspecified viral hepatitis C without hepatic coma: Secondary | ICD-10-CM

## 2013-08-08 NOTE — Telephone Encounter (Signed)
Per Dr.Rehman the patient will need to have labs drawn the 4 th week in June.

## 2013-11-05 ENCOUNTER — Other Ambulatory Visit (INDEPENDENT_AMBULATORY_CARE_PROVIDER_SITE_OTHER): Payer: Self-pay | Admitting: *Deleted

## 2013-11-05 ENCOUNTER — Encounter (INDEPENDENT_AMBULATORY_CARE_PROVIDER_SITE_OTHER): Payer: Self-pay | Admitting: *Deleted

## 2013-11-05 DIAGNOSIS — B1920 Unspecified viral hepatitis C without hepatic coma: Secondary | ICD-10-CM

## 2014-04-30 ENCOUNTER — Encounter (INDEPENDENT_AMBULATORY_CARE_PROVIDER_SITE_OTHER): Payer: Self-pay | Admitting: *Deleted

## 2014-06-01 ENCOUNTER — Other Ambulatory Visit: Payer: Self-pay | Admitting: Neurosurgery

## 2014-06-01 ENCOUNTER — Ambulatory Visit
Admission: RE | Admit: 2014-06-01 | Discharge: 2014-06-01 | Disposition: A | Discharge status: Home / Self Care | Payer: BC Managed Care – PPO | Source: Ambulatory Visit | Attending: Neurosurgery | Admitting: Neurosurgery

## 2014-06-01 DIAGNOSIS — M4726 Other spondylosis with radiculopathy, lumbar region: Secondary | ICD-10-CM

## 2014-06-01 MED ORDER — GADOBENATE DIMEGLUMINE 529 MG/ML IV SOLN
14.0000 mL | Freq: Once | INTRAVENOUS | Status: AC | PRN
  Administered 2014-06-01: 14 mL via INTRAVENOUS

## 2014-06-02 ENCOUNTER — Other Ambulatory Visit (HOSPITAL_COMMUNITY): Payer: Self-pay | Admitting: Neurosurgery

## 2014-06-02 ENCOUNTER — Encounter (HOSPITAL_COMMUNITY): Payer: Self-pay | Admitting: *Deleted

## 2014-06-02 DIAGNOSIS — M5126 Other intervertebral disc displacement, lumbar region: Secondary | ICD-10-CM | POA: Insufficient documentation

## 2014-06-02 MED ORDER — MUPIROCIN 2 % EX OINT
1.0000 "application " | TOPICAL_OINTMENT | Freq: Once | CUTANEOUS | Status: AC
  Administered 2014-06-03: 1 via TOPICAL
  Filled 2014-06-02: qty 22

## 2014-06-02 MED ORDER — CEFAZOLIN SODIUM-DEXTROSE 2-3 GM-% IV SOLR
2.0000 g | INTRAVENOUS | Status: AC
Start: 2014-06-03 — End: 2014-06-03
  Administered 2014-06-03: 2 g via INTRAVENOUS
  Filled 2014-06-02: qty 50

## 2014-06-02 NOTE — Progress Notes (Signed)
Requested EKG from [facility]

## 2014-06-03 ENCOUNTER — Encounter (HOSPITAL_COMMUNITY): Payer: Self-pay | Admitting: Critical Care Medicine

## 2014-06-03 ENCOUNTER — Ambulatory Visit (HOSPITAL_COMMUNITY): Payer: BC Managed Care – PPO | Admitting: Anesthesiology

## 2014-06-03 ENCOUNTER — Ambulatory Visit (HOSPITAL_COMMUNITY)
Admission: RE | Admit: 2014-06-03 | Discharge: 2014-06-03 | Disposition: A | Discharge status: Home / Self Care | Payer: BC Managed Care – PPO | Source: Ambulatory Visit | Attending: Neurosurgery | Admitting: Neurosurgery

## 2014-06-03 ENCOUNTER — Encounter (HOSPITAL_COMMUNITY)
Admission: RE | Disposition: A | Discharge status: Home / Self Care | Payer: Self-pay | Source: Ambulatory Visit | Attending: Neurosurgery

## 2014-06-03 ENCOUNTER — Ambulatory Visit (HOSPITAL_COMMUNITY): Payer: BC Managed Care – PPO

## 2014-06-03 DIAGNOSIS — M5116 Intervertebral disc disorders with radiculopathy, lumbar region: Secondary | ICD-10-CM | POA: Insufficient documentation

## 2014-06-03 DIAGNOSIS — R079 Chest pain, unspecified: Secondary | ICD-10-CM | POA: Insufficient documentation

## 2014-06-03 DIAGNOSIS — F17210 Nicotine dependence, cigarettes, uncomplicated: Secondary | ICD-10-CM | POA: Insufficient documentation

## 2014-06-03 DIAGNOSIS — R740 Nonspecific elevation of levels of transaminase and lactic acid dehydrogenase [LDH]: Secondary | ICD-10-CM | POA: Insufficient documentation

## 2014-06-03 DIAGNOSIS — E876 Hypokalemia: Secondary | ICD-10-CM | POA: Diagnosis not present

## 2014-06-03 DIAGNOSIS — K219 Gastro-esophageal reflux disease without esophagitis: Secondary | ICD-10-CM | POA: Insufficient documentation

## 2014-06-03 DIAGNOSIS — B182 Chronic viral hepatitis C: Secondary | ICD-10-CM | POA: Insufficient documentation

## 2014-06-03 DIAGNOSIS — I10 Essential (primary) hypertension: Secondary | ICD-10-CM | POA: Diagnosis not present

## 2014-06-03 DIAGNOSIS — M5126 Other intervertebral disc displacement, lumbar region: Secondary | ICD-10-CM

## 2014-06-03 DIAGNOSIS — M4726 Other spondylosis with radiculopathy, lumbar region: Secondary | ICD-10-CM | POA: Diagnosis not present

## 2014-06-03 HISTORY — PX: LUMBAR LAMINECTOMY/DECOMPRESSION MICRODISCECTOMY: SHX5026

## 2014-06-03 LAB — CBC
HCT: 49.6 % (ref 39.0–52.0)
Hemoglobin: 17.3 g/dL — ABNORMAL HIGH (ref 13.0–17.0)
MCH: 32.3 pg (ref 26.0–34.0)
MCHC: 34.9 g/dL (ref 30.0–36.0)
MCV: 92.7 fL (ref 78.0–100.0)
Platelets: 212 10*3/uL (ref 150–400)
RBC: 5.35 MIL/uL (ref 4.22–5.81)
RDW: 12.6 % (ref 11.5–15.5)
WBC: 10.4 10*3/uL (ref 4.0–10.5)

## 2014-06-03 LAB — COMPREHENSIVE METABOLIC PANEL
ALT: 15 U/L (ref 0–53)
AST: 19 U/L (ref 0–37)
Albumin: 3.9 g/dL (ref 3.5–5.2)
Alkaline Phosphatase: 55 U/L (ref 39–117)
Anion gap: 11 (ref 5–15)
BUN: 11 mg/dL (ref 6–23)
CO2: 21 mmol/L (ref 19–32)
Calcium: 9.1 mg/dL (ref 8.4–10.5)
Chloride: 106 mEq/L (ref 96–112)
Creatinine, Ser: 0.8 mg/dL (ref 0.50–1.35)
GFR calc Af Amer: >90 mL/min (ref >90)
GFR calc non Af Amer: >90 mL/min (ref >90)
Glucose, Bld: 96 mg/dL (ref 70–99)
Potassium: 4.2 mmol/L (ref 3.5–5.1)
Sodium: 138 mmol/L (ref 135–145)
Total Bilirubin: 0.8 mg/dL (ref 0.3–1.2)
Total Protein: 7.2 g/dL (ref 6.0–8.3)

## 2014-06-03 LAB — SURGICAL PCR SCREEN
MRSA, PCR: NEGATIVE
Staphylococcus aureus: NEGATIVE

## 2014-06-03 SURGERY — LUMBAR LAMINECTOMY/DECOMPRESSION MICRODISCECTOMY 1 LEVEL
Anesthesia: General | Site: Spine Lumbar | Laterality: Left

## 2014-06-03 MED ORDER — SODIUM CHLORIDE 0.9 % IJ SOLN
INTRAMUSCULAR | Status: AC
  Filled 2014-06-03: qty 10

## 2014-06-03 MED ORDER — ACETAMINOPHEN 325 MG PO TABS: 650.0000 mg | ORAL_TABLET | ORAL | Status: DC | PRN

## 2014-06-03 MED ORDER — ALUM & MAG HYDROXIDE-SIMETH 200-200-20 MG/5ML PO SUSP
30.0000 mL | ORAL | Status: AC
  Administered 2014-06-03: 30 mL via ORAL
  Filled 2014-06-03 (×3): qty 30

## 2014-06-03 MED ORDER — MORPHINE SULFATE 4 MG/ML IJ SOLN: 4.0000 mg | INTRAMUSCULAR | Status: DC | PRN

## 2014-06-03 MED ORDER — NEOSTIGMINE METHYLSULFATE 10 MG/10ML IV SOLN
INTRAVENOUS | Status: DC | PRN
  Administered 2014-06-03: 4 mg via INTRAVENOUS

## 2014-06-03 MED ORDER — ALUM & MAG HYDROXIDE-SIMETH 200-200-20 MG/5ML PO SUSP: 30.0000 mL | Freq: Four times a day (QID) | ORAL | Status: DC | PRN

## 2014-06-03 MED ORDER — OXYCODONE-ACETAMINOPHEN 5-325 MG PO TABS: 1.0000 | ORAL_TABLET | ORAL | Status: DC | PRN

## 2014-06-03 MED ORDER — BISACODYL 10 MG RE SUPP: 10.0000 mg | Freq: Every day | RECTAL | Status: DC | PRN

## 2014-06-03 MED ORDER — LIDOCAINE HCL (CARDIAC) 20 MG/ML IV SOLN
INTRAVENOUS | Status: DC | PRN
  Administered 2014-06-03: 50 mg via INTRAVENOUS

## 2014-06-03 MED ORDER — CYCLOBENZAPRINE HCL 10 MG PO TABS
ORAL_TABLET | ORAL | Status: AC
  Filled 2014-06-03: qty 1

## 2014-06-03 MED ORDER — CYCLOBENZAPRINE HCL 10 MG PO TABS
10.0000 mg | ORAL_TABLET | Freq: Three times a day (TID) | ORAL | Status: DC | PRN
  Administered 2014-06-03: 10 mg via ORAL

## 2014-06-03 MED ORDER — THROMBIN 5000 UNITS EX SOLR
CUTANEOUS | Status: DC | PRN
  Administered 2014-06-03 (×2): 5000 [IU] via TOPICAL

## 2014-06-03 MED ORDER — SODIUM CHLORIDE 0.9 % IR SOLN
Status: DC | PRN
  Administered 2014-06-03: 09:00:00

## 2014-06-03 MED ORDER — KETOROLAC TROMETHAMINE 30 MG/ML IJ SOLN
INTRAMUSCULAR | Status: AC
  Filled 2014-06-03: qty 1

## 2014-06-03 MED ORDER — KETOROLAC TROMETHAMINE 30 MG/ML IJ SOLN
30.0000 mg | Freq: Once | INTRAMUSCULAR | Status: AC
  Administered 2014-06-03: 30 mg via INTRAVENOUS

## 2014-06-03 MED ORDER — LIDOCAINE-EPINEPHRINE 1 %-1:100000 IJ SOLN
INTRAMUSCULAR | Status: DC | PRN
  Administered 2014-06-03: 10 mL

## 2014-06-03 MED ORDER — DEXTROSE 5 % IV SOLN
10.0000 mg | INTRAVENOUS | Status: DC | PRN
  Administered 2014-06-03: 10 ug/min via INTRAVENOUS

## 2014-06-03 MED ORDER — EPHEDRINE SULFATE 50 MG/ML IJ SOLN
INTRAMUSCULAR | Status: AC
  Filled 2014-06-03: qty 1

## 2014-06-03 MED ORDER — FENTANYL CITRATE 0.05 MG/ML IJ SOLN
INTRAMUSCULAR | Status: DC | PRN
  Administered 2014-06-03 (×3): 50 ug via INTRAVENOUS
  Administered 2014-06-03: 100 ug via INTRAVENOUS

## 2014-06-03 MED ORDER — ROCURONIUM BROMIDE 100 MG/10ML IV SOLN
INTRAVENOUS | Status: DC | PRN
  Administered 2014-06-03: 40 mg via INTRAVENOUS

## 2014-06-03 MED ORDER — PROMETHAZINE HCL 25 MG/ML IJ SOLN: 6.2500 mg | INTRAMUSCULAR | Status: DC | PRN

## 2014-06-03 MED ORDER — PROPOFOL 10 MG/ML IV BOLUS
INTRAVENOUS | Status: AC
  Filled 2014-06-03: qty 20

## 2014-06-03 MED ORDER — ZOLPIDEM TARTRATE 5 MG PO TABS: 5.0000 mg | ORAL_TABLET | Freq: Every evening | ORAL | Status: DC | PRN

## 2014-06-03 MED ORDER — LACTATED RINGERS IV SOLN
INTRAVENOUS | Status: DC | PRN
  Administered 2014-06-03: 08:00:00 via INTRAVENOUS

## 2014-06-03 MED ORDER — FENTANYL CITRATE 0.05 MG/ML IJ SOLN
INTRAMUSCULAR | Status: AC
  Filled 2014-06-03: qty 2

## 2014-06-03 MED ORDER — ACETAMINOPHEN 650 MG RE SUPP: 650.0000 mg | RECTAL | Status: DC | PRN

## 2014-06-03 MED ORDER — ACETAMINOPHEN 10 MG/ML IV SOLN
INTRAVENOUS | Status: AC
  Administered 2014-06-03: 1000 mg via INTRAVENOUS
  Filled 2014-06-03: qty 100

## 2014-06-03 MED ORDER — MIDAZOLAM HCL 2 MG/2ML IJ SOLN
INTRAMUSCULAR | Status: AC
  Filled 2014-06-03: qty 2

## 2014-06-03 MED ORDER — DEXAMETHASONE SODIUM PHOSPHATE 4 MG/ML IJ SOLN
INTRAMUSCULAR | Status: DC | PRN
  Administered 2014-06-03: 4 mg via INTRAVENOUS

## 2014-06-03 MED ORDER — SODIUM CHLORIDE 0.9 % IJ SOLN
3.0000 mL | Freq: Two times a day (BID) | INTRAMUSCULAR | Status: DC
  Administered 2014-06-03: 3 mL via INTRAVENOUS

## 2014-06-03 MED ORDER — SUCCINYLCHOLINE CHLORIDE 20 MG/ML IJ SOLN
INTRAMUSCULAR | Status: AC
Start: 2014-06-03 — End: 2014-06-03
  Filled 2014-06-03: qty 1

## 2014-06-03 MED ORDER — FENTANYL CITRATE 0.05 MG/ML IJ SOLN
25.0000 ug | INTRAMUSCULAR | Status: DC | PRN
  Administered 2014-06-03 (×3): 50 ug via INTRAVENOUS

## 2014-06-03 MED ORDER — MIDAZOLAM HCL 5 MG/5ML IJ SOLN
INTRAMUSCULAR | Status: DC | PRN
  Administered 2014-06-03: 2 mg via INTRAVENOUS

## 2014-06-03 MED ORDER — PANTOPRAZOLE SODIUM 40 MG IV SOLR
40.0000 mg | INTRAVENOUS | Status: AC
  Administered 2014-06-03: 40 mg via INTRAVENOUS
  Filled 2014-06-03 (×3): qty 40

## 2014-06-03 MED ORDER — ROCURONIUM BROMIDE 50 MG/5ML IV SOLN
INTRAVENOUS | Status: AC
  Filled 2014-06-03: qty 1

## 2014-06-03 MED ORDER — MAGNESIUM HYDROXIDE 400 MG/5ML PO SUSP: 30.0000 mL | Freq: Every day | ORAL | Status: DC | PRN

## 2014-06-03 MED ORDER — GLYCOPYRROLATE 0.2 MG/ML IJ SOLN
INTRAMUSCULAR | Status: DC | PRN
  Administered 2014-06-03: 0.6 mg via INTRAVENOUS

## 2014-06-03 MED ORDER — FENTANYL CITRATE 0.05 MG/ML IJ SOLN
INTRAMUSCULAR | Status: DC | PRN
  Administered 2014-06-03: 100 ug via INTRAVENOUS

## 2014-06-03 MED ORDER — FENTANYL CITRATE 0.05 MG/ML IJ SOLN
INTRAMUSCULAR | Status: DC
Start: 2014-06-03 — End: 2014-06-03
  Filled 2014-06-03: qty 2

## 2014-06-03 MED ORDER — HEMOSTATIC AGENTS (NO CHARGE) OPTIME
TOPICAL | Status: DC | PRN
  Administered 2014-06-03: 1 via TOPICAL

## 2014-06-03 MED ORDER — SODIUM CHLORIDE 0.9 % IJ SOLN: 3.0000 mL | INTRAMUSCULAR | Status: DC | PRN

## 2014-06-03 MED ORDER — PROPOFOL 10 MG/ML IV BOLUS
INTRAVENOUS | Status: DC | PRN
  Administered 2014-06-03: 150 mg via INTRAVENOUS
  Administered 2014-06-03: 50 mg via INTRAVENOUS

## 2014-06-03 MED ORDER — HYDROXYZINE HCL 25 MG PO TABS: 50.0000 mg | ORAL_TABLET | ORAL | Status: DC | PRN

## 2014-06-03 MED ORDER — MENTHOL 3 MG MT LOZG: 1.0000 | LOZENGE | OROMUCOSAL | Status: DC | PRN

## 2014-06-03 MED ORDER — HYDROCODONE-ACETAMINOPHEN 5-325 MG PO TABS
1.0000 | ORAL_TABLET | ORAL | Status: DC | PRN
  Administered 2014-06-03: 2 via ORAL
  Filled 2014-06-03: qty 2

## 2014-06-03 MED ORDER — PHENYLEPHRINE 40 MCG/ML (10ML) SYRINGE FOR IV PUSH (FOR BLOOD PRESSURE SUPPORT)
PREFILLED_SYRINGE | INTRAVENOUS | Status: AC
  Filled 2014-06-03: qty 10

## 2014-06-03 MED ORDER — MEPERIDINE HCL 25 MG/ML IJ SOLN: 6.2500 mg | INTRAMUSCULAR | Status: DC | PRN

## 2014-06-03 MED ORDER — KCL IN DEXTROSE-NACL 20-5-0.45 MEQ/L-%-% IV SOLN
INTRAVENOUS | Status: DC
  Filled 2014-06-03 (×3): qty 1000

## 2014-06-03 MED ORDER — BUPIVACAINE HCL (PF) 0.5 % IJ SOLN
INTRAMUSCULAR | Status: DC | PRN
  Administered 2014-06-03: 10 mL

## 2014-06-03 MED ORDER — IRBESARTAN 300 MG PO TABS
300.0000 mg | ORAL_TABLET | Freq: Every day | ORAL | Status: DC
  Administered 2014-06-03: 300 mg via ORAL
  Filled 2014-06-03: qty 1

## 2014-06-03 MED ORDER — PHENOL 1.4 % MT LIQD: 1.0000 | OROMUCOSAL | Status: DC | PRN

## 2014-06-03 MED ORDER — PHENYLEPHRINE HCL 10 MG/ML IJ SOLN
INTRAMUSCULAR | Status: DC | PRN
  Administered 2014-06-03 (×5): 80 ug via INTRAVENOUS

## 2014-06-03 MED ORDER — METHYLPREDNISOLONE ACETATE 80 MG/ML IJ SUSP
INTRAMUSCULAR | Status: DC | PRN
  Administered 2014-06-03: 80 mg

## 2014-06-03 MED ORDER — FENTANYL CITRATE 0.05 MG/ML IJ SOLN
INTRAMUSCULAR | Status: AC
  Filled 2014-06-03: qty 5

## 2014-06-03 MED ORDER — KETOROLAC TROMETHAMINE 30 MG/ML IJ SOLN
30.0000 mg | Freq: Four times a day (QID) | INTRAMUSCULAR | Status: DC
  Administered 2014-06-03: 30 mg via INTRAVENOUS
  Filled 2014-06-03: qty 1

## 2014-06-03 MED ORDER — 0.9 % SODIUM CHLORIDE (POUR BTL) OPTIME
TOPICAL | Status: DC | PRN
  Administered 2014-06-03: 1000 mL

## 2014-06-03 MED ORDER — ONDANSETRON HCL 4 MG/2ML IJ SOLN: 4.0000 mg | Freq: Four times a day (QID) | INTRAMUSCULAR | Status: DC | PRN

## 2014-06-03 MED ORDER — LIDOCAINE HCL (CARDIAC) 20 MG/ML IV SOLN
INTRAVENOUS | Status: AC
  Filled 2014-06-03: qty 5

## 2014-06-03 MED ORDER — ONDANSETRON HCL 4 MG/2ML IJ SOLN
INTRAMUSCULAR | Status: DC | PRN
  Administered 2014-06-03: 4 mg via INTRAVENOUS

## 2014-06-03 SURGICAL SUPPLY — 61 items
APL SKNCLS STERI-STRIP NONHPOA (GAUZE/BANDAGES/DRESSINGS) ×1
BAG DECANTER FOR FLEXI CONT (MISCELLANEOUS) ×2 IMPLANT
BENZOIN TINCTURE PRP APPL 2/3 (GAUZE/BANDAGES/DRESSINGS) ×1 IMPLANT
BLADE CLIPPER SURG (BLADE) IMPLANT
BRUSH SCRUB EZ PLAIN DRY (MISCELLANEOUS) ×2 IMPLANT
BUR ACORN 6.0 ACORN (BURR) ×1 IMPLANT
BUR ACRON 5.0MM COATED (BURR) ×1 IMPLANT
BUR MATCHSTICK NEURO 3.0 LAGG (BURR) ×2 IMPLANT
CANISTER SUCT 3000ML (MISCELLANEOUS) ×2 IMPLANT
CONT SPEC 4OZ CLIKSEAL STRL BL (MISCELLANEOUS) ×1 IMPLANT
DRAPE LAPAROTOMY 100X72X124 (DRAPES) ×2 IMPLANT
DRAPE MICROSCOPE LEICA (MISCELLANEOUS) ×2 IMPLANT
DRAPE POUCH INSTRU U-SHP 10X18 (DRAPES) ×2 IMPLANT
DRSG EMULSION OIL 3X3 NADH (GAUZE/BANDAGES/DRESSINGS) IMPLANT
ELECT REM PT RETURN 9FT ADLT (ELECTROSURGICAL) ×2
ELECTRODE REM PT RTRN 9FT ADLT (ELECTROSURGICAL) ×1 IMPLANT
GAUZE SPONGE 4X4 12PLY STRL (GAUZE/BANDAGES/DRESSINGS) IMPLANT
GAUZE SPONGE 4X4 16PLY XRAY LF (GAUZE/BANDAGES/DRESSINGS) IMPLANT
GLOVE BIOGEL M 8.0 STRL (GLOVE) ×2 IMPLANT
GLOVE BIOGEL PI IND STRL 7.5 (GLOVE) IMPLANT
GLOVE BIOGEL PI IND STRL 8 (GLOVE) ×1 IMPLANT
GLOVE BIOGEL PI INDICATOR 7.5 (GLOVE) ×1
GLOVE BIOGEL PI INDICATOR 8 (GLOVE) ×1
GLOVE ECLIPSE 7.5 STRL STRAW (GLOVE) ×2 IMPLANT
GLOVE EXAM NITRILE LRG STRL (GLOVE) IMPLANT
GLOVE EXAM NITRILE MD LF STRL (GLOVE) IMPLANT
GLOVE EXAM NITRILE XL STR (GLOVE) IMPLANT
GLOVE EXAM NITRILE XS STR PU (GLOVE) IMPLANT
GLOVE SURG SS PI 7.0 STRL IVOR (GLOVE) ×3 IMPLANT
GOWN STRL REUS W/ TWL LRG LVL3 (GOWN DISPOSABLE) ×1 IMPLANT
GOWN STRL REUS W/ TWL XL LVL3 (GOWN DISPOSABLE) IMPLANT
GOWN STRL REUS W/TWL 2XL LVL3 (GOWN DISPOSABLE) IMPLANT
GOWN STRL REUS W/TWL LRG LVL3 (GOWN DISPOSABLE) ×4
GOWN STRL REUS W/TWL XL LVL3 (GOWN DISPOSABLE)
KIT BASIN OR (CUSTOM PROCEDURE TRAY) ×2 IMPLANT
KIT ROOM TURNOVER OR (KITS) ×2 IMPLANT
LIQUID BAND (GAUZE/BANDAGES/DRESSINGS) ×1 IMPLANT
NDL HYPO 18GX1.5 BLUNT FILL (NEEDLE) IMPLANT
NDL SPNL 18GX3.5 QUINCKE PK (NEEDLE) ×1 IMPLANT
NDL SPNL 22GX3.5 QUINCKE BK (NEEDLE) ×1 IMPLANT
NEEDLE HYPO 18GX1.5 BLUNT FILL (NEEDLE) ×2 IMPLANT
NEEDLE SPNL 18GX3.5 QUINCKE PK (NEEDLE) ×2 IMPLANT
NEEDLE SPNL 22GX3.5 QUINCKE BK (NEEDLE) ×2 IMPLANT
NS IRRIG 1000ML POUR BTL (IV SOLUTION) ×2 IMPLANT
PACK LAMINECTOMY NEURO (CUSTOM PROCEDURE TRAY) ×2 IMPLANT
PAD ARMBOARD 7.5X6 YLW CONV (MISCELLANEOUS) ×6 IMPLANT
PATTIES SURGICAL .5 X1 (DISPOSABLE) ×1 IMPLANT
RUBBERBAND STERILE (MISCELLANEOUS) ×4 IMPLANT
SPONGE LAP 4X18 X RAY DECT (DISPOSABLE) IMPLANT
SPONGE SURGIFOAM ABS GEL SZ50 (HEMOSTASIS) ×2 IMPLANT
STRIP CLOSURE SKIN 1/2X4 (GAUZE/BANDAGES/DRESSINGS) ×1 IMPLANT
SUT PROLENE 6 0 BV (SUTURE) IMPLANT
SUT VIC AB 1 CT1 18XBRD ANBCTR (SUTURE) ×1 IMPLANT
SUT VIC AB 1 CT1 8-18 (SUTURE) ×2
SUT VIC AB 2-0 CP2 18 (SUTURE) ×2 IMPLANT
SUT VIC AB 3-0 SH 8-18 (SUTURE) ×1 IMPLANT
SYR 20ML ECCENTRIC (SYRINGE) ×2 IMPLANT
SYR 5ML LL (SYRINGE) ×1 IMPLANT
TOWEL OR 17X24 6PK STRL BLUE (TOWEL DISPOSABLE) ×2 IMPLANT
TOWEL OR 17X26 10 PK STRL BLUE (TOWEL DISPOSABLE) ×2 IMPLANT
WATER STERILE IRR 1000ML POUR (IV SOLUTION) ×2 IMPLANT

## 2014-06-03 NOTE — Op Note (Signed)
06/03/2014  9:44 AM  PATIENT:  Micheal Serrano  58 y.o. male  PRE-OPERATIVE DIAGNOSIS:  Left L5-S1 lumbar herniated disc, lumbar degenerative disc disease, lumbar spondylosis, lumbar radiculopathy  POST-OPERATIVE DIAGNOSIS:  Left L5-S1 lumbar herniated disc, lumbar degenerative disc disease, lumbar spondylosis, lumbar radiculopathy  PROCEDURE:  Procedure(s):  Left L5-S1 lumbar laminotomy and microdiscectomy, with micro-section, microsurgical technique, and the operating microscope  SURGEON:  Surgeon(s): Hosie Spangle, MD Floyce Stakes, MD  ASSISTANTS: Leeroy Cha, M.D.  ANESTHESIA:   general  EBL:  Total I/O In: 800 [I.V.:800] Out: 50 [Blood:50]  BLOOD ADMINISTERED:none  COUNT: Correct per nursing staff  DICTATION: Patient was brought to the operating room and placed under general endotracheal anesthesia. Patient was turned to prone position the lumbar region was prepped with Betadine soap and solution and draped in a sterile fashion. The midline was infiltrated with local anesthetic with epinephrine. A localizing x-ray was taken and the L5-S1 level was identified. Midline incision was made over the L5-S1 level and was carried down through the subcutaneous tissue to the lumbar fascia. The lumbar fascia was incised on the left side and the paraspinal muscles were dissected from the spinous processes and lamina in a subperiosteal fashion. Another x-ray was taken and the L5-S1 intralaminar space was identified. The operating microscope was draped and brought into the field provided additional magnification, illumination, and visualization. Laminotomy was performed using the high-speed drill and Kerrison punches. The ligamentum flavum was carefully resected. The underlying thecal sac and nerve root were identified. The disc herniation was identified and the thecal sac and nerve root gently retracted medially. We found a large free fragment compressing both the thecal sac and exiting  left S1 nerve root, but additional fragment extending rostrally and laterally into the left L5-S1 neural foramen, compressing the exiting left L5 nerve root. The free fragment was removed in a piecemeal fashion, with good decompression of the thecal sac, and exiting left L5 and S1 nerve roots. All loose fragments of disc material removed from the epidural space. We examine the annulus of the L5-S1 disc, which was degenerated and bulging spondyliticly. It was felt that good decompression had been achieved by removing the large free fragment disc herniation, and that there is no benefit to entering the disc space and proceeded with an intradiscal discectomy. The wound was therefore irrigated extensively with bacitracin solution. Hemostasis was established with the use of bipolar cautery, Gelfoam, and the edges of bone were waxed as needed. We then instilled 2 cc of fentanyl and 80 mg of Depo-Medrol into the epidural space. Deep fascia was closed with interrupted undyed 1 Vicryl sutures. Scarpa's fascia was closed with interrupted undyed 1 Vicryl sutures in the subcutaneous and subcuticular layer were closed with interrupted inverted 2-0 undyed Vicryl sutures. The skin edges were approximated with Dermabond. Following surgery the patient was turned back to a supine position to be reversed from the anesthetic extubated and transferred to the recovery room for further care.   PLAN OF CARE: Admit for overnight observation  PATIENT DISPOSITION:  PACU - hemodynamically stable.   Delay start of Pharmacological VTE agent (>24hrs) due to surgical blood loss or risk of bleeding:  yes

## 2014-06-03 NOTE — Transfer of Care (Signed)
Immediate Anesthesia Transfer of Care Note  Patient: Micheal Serrano  Procedure(s) Performed: Procedure(s) with comments: LUMBAR FIVE-SACRAL ONE LUMBAR LAMINECTOMY/DECOMPRESSION MICRODISCECTOMY 1 LEVEL (Left) - Left L5S1 laminotomy and microdiskectomy  Patient Location: PACU  Anesthesia Type:General  Level of Consciousness: awake, alert  and oriented  Airway & Oxygen Therapy: Patient Spontanous Breathing and Patient connected to nasal cannula oxygen  Post-op Assessment: Report given to PACU RN, Post -op Vital signs reviewed and stable and Patient moving all extremities X 4  Post vital signs: Reviewed and stable  Complications: No apparent anesthesia complications

## 2014-06-03 NOTE — Anesthesia Preprocedure Evaluation (Addendum)
Anesthesia Evaluation  Patient identified by MRN, date of birth, ID band Patient awake    Reviewed: Allergy & Precautions, H&P , NPO status , Patient's Chart, lab work & pertinent test results, reviewed documented beta blocker date and time   Airway Mallampati: II       Dental  (+) Teeth Intact   Pulmonary Current Smoker,  + rhonchi         Cardiovascular hypertension, Pt. on medications Rhythm:Regular  ECHO 2021 EF 55-60% EKG 2012 [place]   Neuro/Psych    GI/Hepatic GERD-  Medicated,Hepatitis C 1 year ago, LFT normal now   Endo/Other    Renal/GU      Musculoskeletal   Abdominal (+)  Abdomen: soft.    Peds  Hematology   Anesthesia Other Findings   Reproductive/Obstetrics                          Anesthesia Physical Anesthesia Plan  ASA: II  Anesthesia Plan: General   Post-op Pain Management:    Induction: Intravenous  Airway Management Planned: Oral ETT  Additional Equipment:   Intra-op Plan:   Post-operative Plan: Extubation in OR  Informed Consent: I have reviewed the patients History and Physical, chart, labs and discussed the procedure including the risks, benefits and alternatives for the proposed anesthesia with the patient or authorized representative who has indicated his/her understanding and acceptance.     Plan Discussed with:   Anesthesia Plan Comments: (Check am labs)       Anesthesia Quick Evaluation

## 2014-06-03 NOTE — Discharge Summary (Signed)
Physician Discharge Summary  Patient ID: Micheal Serrano MRN: [id number] DOB/AGE: May 29, 1956 58 y.o.  Admit date: 06/03/2014 Discharge date: 06/03/2014  Admission Diagnoses:  Left L5-S1 lumbar herniated disc, lumbar degenerative disc disease, lumbar spondylosis, lumbar radiculopathy  Discharge Diagnoses:  Left L5-S1 lumbar herniated disc, lumbar degenerative disc disease, lumbar spondylosis, lumbar radiculopathy Active Problems:   HNP (herniated nucleus pulposus), lumbar   Discharged Condition: good  Hospital Course: Patient was admitted, underwent an left L5-S1 lumbar laminotomy and microdiscectomy. Postoperatively he has had significant improvement in his left lumbar radicular syndrome, with decreased pain and numbness.  A dressing was applied by the nursing staff cause of a minimal amount of drainage. I've asked the patient his wife to remove the dressing tomorrow, and the patient is to shower each day, allowing water to run on the incision. There are to remove the Dermabond in 2 [place]. We've given instructions regarding activities following discharge. He is to return for follow-up with me in about 3 [place].  He already has Norco 5/325 and Flexeril at home to use as needed.  Discharge Exam: Blood pressure 139/89, pulse 64, temperature 97.6 F (36.4 C), temperature source Oral, resp. rate 20, height 5\' 6"  (1.676 m), weight 70.761 kg (156 lb), SpO2 97 %.  Disposition: Home     Medication List    TAKE these medications        HYDROcodone-acetaminophen 5-325 MG per tablet  Commonly known as:  NORCO/VICODIN  Take 1 tablet by mouth every 6 (six) hours as needed for moderate pain (every 4 - 6 hours prn).     naproxen sodium 220 MG tablet  Commonly known as:  ANAPROX  Take 220 mg by mouth 2 (two) times daily as needed (pain).     olmesartan 40 MG tablet  Commonly known as:  BENICAR  Take 40 mg by mouth daily.         Signed: Hosie Spangle, MD 06/03/2014, 5:21 PM

## 2014-06-03 NOTE — Anesthesia Postprocedure Evaluation (Signed)
  Anesthesia Post-op Note  Patient: Micheal Serrano  Procedure(s) Performed: Procedure(s) with comments: LUMBAR FIVE-SACRAL ONE LUMBAR LAMINECTOMY/DECOMPRESSION MICRODISCECTOMY 1 LEVEL (Left) - Left L5S1 laminotomy and microdiskectomy  Patient Location: PACU  Anesthesia Type:General  Level of Consciousness: awake and alert   Airway and Oxygen Therapy: Patient Spontanous Breathing and Patient connected to nasal cannula oxygen  Post-op Pain: mild  Post-op Assessment: Post-op Vital signs reviewed, Patient's Cardiovascular Status Stable, Respiratory Function Stable and Patent Airway  Post-op Vital Signs: Reviewed and stable  Last Vitals:  Filed Vitals:   06/03/14 1004  BP:   Pulse: 68  Temp:   Resp: 11    Complications: No apparent anesthesia complications

## 2014-06-03 NOTE — Progress Notes (Signed)
Patient alert and oriented, mae's well, voiding adequate amount of urine, swallowing without difficulty, no c/o pain. Patient discharged home with family. Script and discharged instructions given to patient. Patient and family stated understanding of d/c instructions given and has an appointment with MD. Aisha Johnathin Vanderschaaf RN 

## 2014-06-03 NOTE — Plan of Care (Signed)
Problem: Consults Goal: Diagnosis - Spinal Surgery Outcome: Completed/Met Date Met:  06/03/14 Lumbar Laminectomy (Complex)

## 2014-06-03 NOTE — Discharge Instructions (Signed)
Wound Care °Leave incision open to air. °You may shower. °Do not scrub directly on incision.  °Do not put any creams, lotions, or ointments on incision. °Activity °Walk each and every day, increasing distance each day. °No lifting greater than 5 lbs.  Avoid bending, arching, and twisting. °No driving for 2 [place]; may ride as a passenger locally. ° °Diet °Resume your normal diet.  °Return to Work °Will be discussed at you follow up appointment. °Call Your Doctor If Any of These Occur °Redness, drainage, or swelling at the wound.  °Temperature greater than 101 degrees. °Severe pain not relieved by pain medication. °Incision starts to come apart. °Follow Up Appt °Call today for appointment in 3 [place] ([phone]) or for problems.  If you have any hardware placed in your spine, you will need an x-ray before your appointment. °

## 2014-06-03 NOTE — H&P (Signed)
Subjective: Patient is a 58 y.o. male who is admitted for treatment of acute left L5-S1 lumbar disc herniation.  Without any particular inciting cause, the patient to half Iddings ago developed acute left lumbar radiculopathy, with pain in the left buttock, thigh, and leg. Pain extends both into the left calf but also into the left shin. He has numbness and paresthesias in the left leg and foot. Examination is shown increasing weakness in the left dorsiflexor and EHL. MRI was done and revealed a large left L5-S1 lumbar disc herniation, superimposed on underlying degenerative disc disease and spondylosis, with compression of the left L5 and left S1 nerve roots. Patient admitted now for a left L5-S1 lumbar laminotomy and microdiscectomy. MRI also showed significant degenerative disc disease and spondylosis at the L4-5 level with a chronic right L4-5 disc herniation, which is not symptomatic.   Patient Active Problem List   Diagnosis Date Noted  . Hepatitis, chronic active 01/06/2013  . Hypokalemia 02/28/2011  . Tobacco abuse 02/28/2011  . GERD (gastroesophageal reflux disease) 02/28/2011  . Transaminitis 02/28/2011  . IFG (impaired fasting glucose) 02/28/2011  . Chest pain 02/27/2011  . HTN (hypertension) 02/27/2011   Past Medical History  Diagnosis Date  . Hypertension   . GERD (gastroesophageal reflux disease)   . Hepatitis C     states this has been treated and is clear    Past Surgical History  Procedure Laterality Date  . Cholecystectomy    . Knee arthroscopy Right   . Hernia repair    . Colonoscopy N/A 01/30/2013    Procedure: COLONOSCOPY;  Surgeon: Rogene Houston, MD;  Location: [facility];  Service: Endoscopy;  Laterality: N/A;  100-moved to 12:00 Ann to notify pt  . Vasectomy    . Cervical fusion  06/2013    Prescriptions prior to admission  Medication Sig Dispense Refill Last Dose  . HYDROcodone-acetaminophen (NORCO/VICODIN) 5-325 MG per tablet Take 1 tablet by mouth every 6  (six) hours as needed for moderate pain (every 4 - 6 hours prn).   06/03/2014 at 0545  . naproxen sodium (ANAPROX) 220 MG tablet Take 220 mg by mouth 2 (two) times daily as needed (pain).    Past Week at Unknown time  . olmesartan (BENICAR) 40 MG tablet Take 40 mg by mouth daily.   06/02/2014 at Unknown time   No Known Allergies  History  Substance Use Topics  . Smoking status: Current Every Day Smoker    Types: Cigarettes  . Smokeless tobacco: Never Used     Comment: 1 pack a day x  x 30 yrs  . Alcohol Use: 8.4 oz/week    14 Cans of beer per week     Comment: 2-3 beers a day    Family History  Problem Relation Age of Onset  . Colon cancer Neg Hx   . Breast cancer Mother   . Dementia Father      Review of Systems A comprehensive review of systems was negative.  Objective: Vital signs in last 24 hours: Temp:  [97.5 F (36.4 C)] 97.5 F (36.4 C) (12/23 0705) Pulse Rate:  [73] 73 (12/23 0705) Resp:  [20] 20 (12/23 0705) BP: (133)/(75) 133/75 mmHg (12/23 0705) SpO2:  [95 %] 95 % (12/23 0705) Weight:  [70.761 kg (156 lb)] 70.761 kg (156 lb) (12/23 0701)  EXAM: Patient is a well-developed well-nourished white male in discomfort, but no acute distress. Lungs are clear to auscultation , the patient has symmetrical respiratory excursion.  Heart has a regular rate and rhythm normal S1 and S2 no murmur.   Abdomen is soft nontender nondistended bowel sounds are present. Extremity examination shows no clubbing cyanosis or edema. Motor examination shows 5 over 5 strength in the right lower extremity including the iliopsoas quadriceps dorsiflexor extensor hallicus  longus and plantar flexor, but the left lower extremity shows 5 over 5 strength in the iliopsoas quadriceps and plantar flexor, but the left dorsiflexor is 4+ to 5/5 in the left EHL is 4+/5. Sensation is intact to pinprick in the distal lower extremities. Reflex examination shows the left quadriceps 2, right quadriceps 1, left gastric  name is absent, right gastric name is 1. Toes are downgoing bilaterally. Gait and stance both favor the left lower extremity.   Data Review:CBC    Component Value Date/Time   WBC 8.4 08/04/2013 0835   RBC 5.39 08/04/2013 0835   HGB 17.6* 08/04/2013 0835   HCT 49.5 08/04/2013 0835   PLT 248 08/04/2013 0835   MCV 91.8 08/04/2013 0835   MCH 32.7 08/04/2013 0835   MCHC 35.6 08/04/2013 0835   RDW 13.8 08/04/2013 0835   LYMPHSABS 3.2 02/27/2011 2006   MONOABS 1.1* 02/27/2011 2006   EOSABS 0.3 02/27/2011 2006   BASOSABS 0.0 02/27/2011 2006                          BMET    Component Value Date/Time   NA 140 01/06/2013 1605   K 4.1 01/06/2013 1605   CL 106 01/06/2013 1605   CO2 26 01/06/2013 1605   GLUCOSE 73 01/06/2013 1605   BUN 8 01/06/2013 1605   CREATININE 0.82 01/06/2013 1605   CREATININE 0.54 02/28/2011 0300   CREATININE 0.57 02/28/2011 0300   CALCIUM 9.8 01/06/2013 1605   GFRNONAA >60 02/28/2011 0300   GFRNONAA >60 02/28/2011 0300   GFRAA >60 02/28/2011 0300   GFRAA >60 02/28/2011 0300     Assessment/Plan: Patient presented with an acute left lumbar radiculopathy, with pain, numbness, paresthesias, and weakness, due to a large left L5-S1 lumbar disc herniation, superimposed upon lumbar spondylosis and degenerative disc disease. Patient admitted now for left L5-S1 lumbar laminotomy and microdiscectomy.  I've discussed with the patient the nature of his condition, the nature the surgical procedure, the typical length of surgery, hospital stay, and overall recuperation. We discussed limitations postoperatively. I discussed risks of surgery including risks of infection, bleeding, possibly need for transfusion, the risk of nerve root dysfunction with pain, weakness, numbness, or paresthesias, or risk of dural tear and CSF leakage and possible need for further surgery, the risk of recurrent disc herniation and the possible need for further surgery, and the risk of anesthetic  complications including myocardial infarction, stroke, pneumonia, and death. Understanding all this the patient does wish to proceed with surgery and is admitted for such.    Hosie Spangle, MD 06/03/2014 7:34 AM

## 2014-06-03 NOTE — Anesthesia Procedure Notes (Signed)
Procedure Name: Intubation Date/Time: 06/03/2014 8:23 AM Performed by: Carola Frost Pre-anesthesia Checklist: Patient identified, Timeout performed, Emergency Drugs available, Patient being monitored and Suction available Patient Re-evaluated:Patient Re-evaluated prior to inductionOxygen Delivery Method: Circle system utilized Preoxygenation: Pre-oxygenation with 100% oxygen Intubation Type: IV induction Ventilation: Mask ventilation without difficulty Laryngoscope Size: Mac Grade View: Grade III Tube type: Oral Tube size: 7.5 mm Number of attempts: 1 Placement Confirmation: CO2 detector,  positive ETCO2,  ETT inserted through vocal cords under direct vision and breath sounds checked- equal and bilateral Secured at: 23 cm Tube secured with: Tape Dental Injury: Teeth and Oropharynx as per pre-operative assessment  Comments: DLx1 by Dr. Tresa Moore

## 2014-06-04 ENCOUNTER — Encounter (HOSPITAL_COMMUNITY): Payer: Self-pay | Admitting: Neurosurgery

## 2014-06-24 DIAGNOSIS — Z981 Arthrodesis status: Secondary | ICD-10-CM | POA: Insufficient documentation

## 2014-08-06 ENCOUNTER — Ambulatory Visit (INDEPENDENT_AMBULATORY_CARE_PROVIDER_SITE_OTHER): Payer: BC Managed Care – PPO | Admitting: Internal Medicine

## 2014-12-12 ENCOUNTER — Emergency Department (HOSPITAL_COMMUNITY): Payer: BLUE CROSS/BLUE SHIELD

## 2014-12-12 ENCOUNTER — Encounter (HOSPITAL_COMMUNITY): Payer: Self-pay | Admitting: Emergency Medicine

## 2014-12-12 ENCOUNTER — Observation Stay (HOSPITAL_COMMUNITY)
Admission: EM | Admit: 2014-12-12 | Discharge: 2014-12-13 | Disposition: A | Discharge status: Home / Self Care | Payer: BLUE CROSS/BLUE SHIELD | Attending: Internal Medicine | Admitting: Internal Medicine

## 2014-12-12 DIAGNOSIS — K219 Gastro-esophageal reflux disease without esophagitis: Secondary | ICD-10-CM | POA: Diagnosis not present

## 2014-12-12 DIAGNOSIS — R079 Chest pain, unspecified: Secondary | ICD-10-CM | POA: Diagnosis present

## 2014-12-12 DIAGNOSIS — F1010 Alcohol abuse, uncomplicated: Secondary | ICD-10-CM | POA: Insufficient documentation

## 2014-12-12 DIAGNOSIS — I10 Essential (primary) hypertension: Secondary | ICD-10-CM | POA: Diagnosis not present

## 2014-12-12 DIAGNOSIS — Z79899 Other long term (current) drug therapy: Secondary | ICD-10-CM | POA: Diagnosis not present

## 2014-12-12 DIAGNOSIS — Z8619 Personal history of other infectious and parasitic diseases: Secondary | ICD-10-CM | POA: Diagnosis not present

## 2014-12-12 DIAGNOSIS — Z720 Tobacco use: Secondary | ICD-10-CM | POA: Diagnosis not present

## 2014-12-12 HISTORY — DX: Cervicalgia: M54.2

## 2014-12-12 HISTORY — DX: Alcohol abuse, uncomplicated: F10.10

## 2014-12-12 LAB — I-STAT CHEM 8, ED
BUN: 6 mg/dL (ref 6–20)
Calcium, Ion: 1.09 mmol/L — ABNORMAL LOW (ref 1.12–1.23)
Chloride: 103 mmol/L (ref 101–111)
Creatinine, Ser: 1.1 mg/dL (ref 0.61–1.24)
Glucose, Bld: 118 mg/dL — ABNORMAL HIGH (ref 65–99)
HEMATOCRIT: 56 % — AB (ref 39.0–52.0)
HEMOGLOBIN: 19 g/dL — AB (ref 13.0–17.0)
POTASSIUM: 3.9 mmol/L (ref 3.5–5.1)
SODIUM: 140 mmol/L (ref 135–145)
TCO2: 22 mmol/L (ref 0–100)

## 2014-12-12 LAB — HEPATIC FUNCTION PANEL
ALK PHOS: 70 U/L (ref 38–126)
ALT: 19 U/L (ref 17–63)
AST: 21 U/L (ref 15–41)
Albumin: 4.3 g/dL (ref 3.5–5.0)
BILIRUBIN DIRECT: 0.2 mg/dL (ref 0.1–0.5)
BILIRUBIN INDIRECT: 0.6 mg/dL (ref 0.3–0.9)
BILIRUBIN TOTAL: 0.8 mg/dL (ref 0.3–1.2)
TOTAL PROTEIN: 7.8 g/dL (ref 6.5–8.1)

## 2014-12-12 LAB — CBC WITH DIFFERENTIAL/PLATELET
BASOS PCT: 0 % (ref 0–1)
Basophils Absolute: 0 10*3/uL (ref 0.0–0.1)
Eosinophils Absolute: 0.3 10*3/uL (ref 0.0–0.7)
Eosinophils Relative: 2 % (ref 0–5)
HCT: 51.1 % (ref 39.0–52.0)
Hemoglobin: 17.7 g/dL — ABNORMAL HIGH (ref 13.0–17.0)
LYMPHS ABS: 4.6 10*3/uL — AB (ref 0.7–4.0)
LYMPHS PCT: 41 % (ref 12–46)
MCH: 32.5 pg (ref 26.0–34.0)
MCHC: 34.6 g/dL (ref 30.0–36.0)
MCV: 93.8 fL (ref 78.0–100.0)
MONOS PCT: 11 % (ref 3–12)
Monocytes Absolute: 1.2 10*3/uL — ABNORMAL HIGH (ref 0.1–1.0)
Neutro Abs: 5.1 10*3/uL (ref 1.7–7.7)
Neutrophils Relative %: 46 % (ref 43–77)
PLATELETS: 238 10*3/uL (ref 150–400)
RBC: 5.45 MIL/uL (ref 4.22–5.81)
RDW: 12.9 % (ref 11.5–15.5)
WBC: 11.2 10*3/uL — ABNORMAL HIGH (ref 4.0–10.5)

## 2014-12-12 LAB — ETHANOL: Alcohol, Ethyl (B): 46 mg/dL — ABNORMAL HIGH (ref <5)

## 2014-12-12 LAB — LIPASE, BLOOD: Lipase: 24 U/L (ref 22–51)

## 2014-12-12 LAB — I-STAT TROPONIN, ED: TROPONIN I, POC: 0.02 ng/mL (ref 0.00–0.08)

## 2014-12-12 MED ORDER — FAMOTIDINE IN NACL 20-0.9 MG/50ML-% IV SOLN
20.0000 mg | Freq: Once | INTRAVENOUS | Status: AC
  Administered 2014-12-12: 20 mg via INTRAVENOUS
  Filled 2014-12-12: qty 50

## 2014-12-12 MED ORDER — INSULIN ASPART 100 UNIT/ML ~~LOC~~ SOLN: 10.0000 [IU] | Freq: Once | SUBCUTANEOUS | Status: DC

## 2014-12-12 MED ORDER — ASPIRIN 81 MG PO CHEW
324.0000 mg | CHEWABLE_TABLET | Freq: Once | ORAL | Status: AC
Start: 2014-12-12 — End: 2014-12-12
  Administered 2014-12-12: 324 mg via ORAL
  Filled 2014-12-12: qty 4

## 2014-12-12 MED ORDER — SODIUM CHLORIDE 0.9 % IV SOLN
INTRAVENOUS | Status: DC
  Administered 2014-12-12 – 2014-12-13 (×2): via INTRAVENOUS

## 2014-12-12 MED ORDER — NITROGLYCERIN 0.4 MG SL SUBL
0.4000 mg | SUBLINGUAL_TABLET | SUBLINGUAL | Status: DC | PRN
  Administered 2014-12-12: 0.4 mg via SUBLINGUAL
  Filled 2014-12-12: qty 1

## 2014-12-12 MED ORDER — GI COCKTAIL ~~LOC~~
30.0000 mL | Freq: Once | ORAL | Status: AC
  Administered 2014-12-12: 30 mL via ORAL
  Filled 2014-12-12: qty 30

## 2014-12-12 MED ORDER — MORPHINE SULFATE 4 MG/ML IJ SOLN: 4.0000 mg | INTRAMUSCULAR | Status: DC | PRN

## 2014-12-12 MED ORDER — ONDANSETRON HCL 4 MG/2ML IJ SOLN
4.0000 mg | INTRAMUSCULAR | Status: DC | PRN
  Administered 2014-12-12: 4 mg via INTRAVENOUS
  Filled 2014-12-12: qty 2

## 2014-12-12 MED ORDER — SODIUM CHLORIDE 0.9 % IV SOLN: INTRAVENOUS | Status: DC

## 2014-12-12 NOTE — ED Provider Notes (Signed)
CSN: [id number]     Arrival date & time 12/12/14  1940 History   First MD Initiated Contact with Patient 12/12/14 1952     Chief Complaint  Patient presents with  . Chest Pain      HPI Pt was seen at [place]. Per pt and his family, c/o gradual onset and worsening of persistent chest "pain" that began approximately 30 min PTA. Pt states he was [place] when he developed mid-sternal and upper abd "crushing" "squeezing" pain. Has been associated with N/V, diaphoresis, and SOB. Pt states he has had similar symptoms previously "but this is different" and "worse." States he took OTC gaviscon without improvement. Denies diarrhea, no back pain, no cough, no palpitations, no fevers.    Past Medical History  Diagnosis Date  . Hypertension   . GERD (gastroesophageal reflux disease)   . Hepatitis C     states this has been treated and is clear  . Neck pain   . Alcohol abuse    Past Surgical History  Procedure Laterality Date  . Cholecystectomy    . Knee arthroscopy Right   . Hernia repair    . Colonoscopy N/A 01/30/2013    Procedure: COLONOSCOPY;  Surgeon: Rogene Houston, MD;  [facility];  Service: Endoscopy;  Laterality: N/A;  100-moved to 12:00 Ann to notify pt  . Vasectomy    . Cervical fusion  06/2013  . Lumbar laminectomy/decompression microdiscectomy Left 06/03/2014    Procedure: LUMBAR FIVE-SACRAL ONE LUMBAR LAMINECTOMY/DECOMPRESSION MICRODISCECTOMY 1 LEVEL;  Surgeon: Hosie Spangle, MD;  Location: [place] [facility];  Service: Neurosurgery;  Laterality: Left;  Left L5S1 laminotomy and microdiskectomy   Family History  Problem Relation Age of Onset  . Colon cancer Neg Hx   . Breast cancer Mother   . Dementia Father    History  Substance Use Topics  . Smoking status: Current Every Day Smoker    Types: Cigarettes  . Smokeless tobacco: Never Used     Comment: 1 pack a day x  x 30 yrs  . Alcohol Use: 8.4 oz/week    14 Cans of beer per week     Comment:  2-3 beers a day    Review of Systems ROS: Statement: All systems negative except as marked or noted in the HPI; Constitutional: Negative for fever and chills. ; ; Eyes: Negative for eye pain, redness and discharge. ; ; ENMT: Negative for ear pain, hoarseness, nasal congestion, sinus pressure and sore throat. ; ; Cardiovascular: +CP, SOB, diaphoresis. Negative for palpitations, and peripheral edema. ; ; Respiratory: Negative for cough, wheezing and stridor. ; ; Gastrointestinal: +abd pain, N/V. Negative for diarrhea, blood in stool, hematemesis, jaundice and rectal bleeding. . ; ; Genitourinary: Negative for dysuria, flank pain and hematuria. ; ; Musculoskeletal: Negative for back pain and neck pain. Negative for swelling and trauma.; ; Skin: Negative for pruritus, rash, abrasions, blisters, bruising and skin lesion.; ; Neuro: Negative for headache, lightheadedness and neck stiffness. Negative for weakness, altered level of consciousness , altered mental status, extremity weakness, paresthesias, involuntary movement, seizure and syncope.     Allergies  Review of patient's allergies indicates no known allergies.  Home Medications   Prior to Admission medications   Medication Sig Start Date End Date Taking? Authorizing Provider  olmesartan (BENICAR) 40 MG tablet Take 40 mg by mouth daily.   Yes Historical Provider, MD  HYDROcodone-acetaminophen (NORCO/VICODIN) 5-325 MG per tablet Take 1 tablet by mouth every  6 (six) hours as needed for moderate pain (every 4 - 6 hours prn).    Historical Provider, MD  naproxen sodium (ANAPROX) 220 MG tablet Take 220 mg by mouth 2 (two) times daily as needed (pain).     Historical Provider, MD   BP 113/93 mmHg  Pulse 80  Temp(Src) 97.2 F (36.2 C) (Axillary)  Resp 10  Ht 5\' 6"  (1.676 m)  Wt 163 lb (73.936 kg)  BMI 26.32 kg/m2  SpO2 94% Physical Exam  1950: Physical examination:  Nursing notes reviewed; Vital signs and O2 SAT reviewed;  Constitutional: Well  developed, Well nourished, uncomfortable appearing.; Head:  Normocephalic, atraumatic; Eyes: EOMI, PERRL, No scleral icterus; ENMT: Mouth and pharynx normal, Mucous membranes dry Neck: Supple, Full range of motion, No lymphadenopathy; Cardiovascular: Regular rate and rhythm, No gallop; Respiratory: Breath sounds clear & equal bilaterally, No wheezes. Hyperventilating. Speaking full sentences with ease, Normal respiratory effort/excursion; Chest: Nontender, Movement normal; Abdomen: Soft, +mid-epigastric tenderness to palp. Nondistended, Normal bowel sounds; Genitourinary: No CVA tenderness; Extremities: Pulses normal, No tenderness, No edema, No calf edema or asymmetry.; Neuro: AA&Ox3, Major CN grossly intact.  Speech clear. No gross focal motor or sensory deficits in extremities.; Skin: Color normal, Warm, Diaphoretic.   ED Course  Procedures     EKG Interpretation   Date/Time:  Saturday December 12 2014 19:44:54 EDT Ventricular Rate:  64 PR Interval:  145 QRS Duration: 97 QT Interval:  434 QTC Calculation: 448 R Axis:   64 Text Interpretation:  Sinus rhythm RSR' in V1 or V2, right VCD or RVH  Artifact When compared with ECG of 02/27/2011 No significant change was  found Confirmed by [facility]  MD, Nunzio Cory [phone]) on 12/12/2014 7:59:55 PM      MDM  MDM Reviewed: previous chart, nursing note and vitals Reviewed previous: labs and ECG Interpretation: labs, ECG and x-ray      Results for orders placed or performed during the hospital encounter of 12/12/14  Hepatic function panel  Result Value Ref Range   Total Protein 7.8 6.5 - 8.1 g/dL   Albumin 4.3 3.5 - 5.0 g/dL   AST 21 15 - 41 U/L   ALT 19 17 - 63 U/L   Alkaline Phosphatase 70 38 - 126 U/L   Total Bilirubin 0.8 0.3 - 1.2 mg/dL   Bilirubin, Direct 0.2 0.1 - 0.5 mg/dL   Indirect Bilirubin 0.6 0.3 - 0.9 mg/dL  Lipase, blood  Result Value Ref Range   Lipase 24 22 - 51 U/L  CBC with Differential  Result Value Ref Range   WBC  11.2 (H) 4.0 - 10.5 K/uL   RBC 5.45 4.22 - 5.81 MIL/uL   Hemoglobin 17.7 (H) 13.0 - 17.0 g/dL   HCT 51.1 39.0 - 52.0 %   MCV 93.8 78.0 - 100.0 fL   MCH 32.5 26.0 - 34.0 pg   MCHC 34.6 30.0 - 36.0 g/dL   RDW 12.9 11.5 - 15.5 %   Platelets 238 150 - 400 K/uL   Neutrophils Relative % 46 43 - 77 %   Neutro Abs 5.1 1.7 - 7.7 K/uL   Lymphocytes Relative 41 12 - 46 %   Lymphs Abs 4.6 (H) 0.7 - 4.0 K/uL   Monocytes Relative 11 3 - 12 %   Monocytes Absolute 1.2 (H) 0.1 - 1.0 K/uL   Eosinophils Relative 2 0 - 5 %   Eosinophils Absolute 0.3 0.0 - 0.7 K/uL   Basophils Relative 0 0 - 1 %  Basophils Absolute 0.0 0.0 - 0.1 K/uL  Ethanol  Result Value Ref Range   Alcohol, Ethyl (B) 46 (H) <5 mg/dL  I-stat Chem 8, ED  Result Value Ref Range   Sodium 140 135 - 145 mmol/L   Potassium 3.9 3.5 - 5.1 mmol/L   Chloride 103 101 - 111 mmol/L   BUN 6 6 - 20 mg/dL   Creatinine, Ser 1.10 0.61 - 1.24 mg/dL   Glucose, Bld 118 (H) 65 - 99 mg/dL   Calcium, Ion 1.09 (L) 1.12 - 1.23 mmol/L   TCO2 22 0 - 100 mmol/L   Hemoglobin 19.0 (H) 13.0 - 17.0 g/dL   HCT 56.0 (H) 39.0 - 52.0 %  I-stat troponin, ED  Result Value Ref Range   Troponin i, poc 0.02 0.00 - 0.08 ng/mL   Comment 3           Dg Chest Port 1 View 12/12/2014   CLINICAL DATA:  Chest and upper abdominal pain. History of hepatitis-C.  EXAM: PORTABLE CHEST - 1 VIEW  COMPARISON:  02/27/2011  FINDINGS: Lower cervical spine fixation. Midline trachea. Mild cardiomegaly. No pleural effusion or pneumothorax. Diffuse peribronchial thickening. Somewhat more focal increased density at the right lung base. No free intraperitoneal air.  IMPRESSION: Mild cardiomegaly with chronic interstitial thickening, likely related to COPD/ chronic bronchitis.  Somewhat more focal right base opacity, for which infection or atelectasis is a concern. Consider PA and lateral radiographs.   Electronically Signed   By: Abigail Miyamoto M.D.   On: 12/12/2014 20:47    2135:  Pt improved  after meds. Pt states his symptoms today were "worse" and "different" from his previous GERD symptoms. Heart score 4; will observation admit. Dx and testing d/w pt and family.  Questions answered.  Verb understanding, agreeable to observation admit. T/C to [facility] Dr. Darrick Meigs, case discussed, including:  HPI, pertinent PM/SHx, VS/PE, dx testing, ED course and treatment:  Agreeable to admit, requests to write temporary orders, obtain observation tele bed to team [facility].   Francine Graven, DO 12/14/14 2053

## 2014-12-12 NOTE — ED Notes (Signed)
Pt stated having genera;ized non radiating chest pain while on golf course today. Pt thinks it's indigestion and states it has happened before. Pt also describes pain as crushing in nature and radiates throughout stomach.

## 2014-12-12 NOTE — H&P (Signed)
PCP:   Delphina Cahill, MD   Chief Complaint:  Chest pain  HPI:  59 year old male who  has a past medical history of Hypertension; GERD (gastroesophageal reflux disease); Hepatitis C; Neck pain; and Alcohol abuse. Today came to the hospital with worsening chest pain which began around 7 PM tonight. Patient says that he was walking on the [place] when he developed midsternal and upper abdominal crushing squeezing pain which was associated with nausea and vomiting and diaphoresis. Patient says that he has history of hiatal hernia and GERD and thought that this was causing the pain. He also had mild shortness of breath. No cough no fever no dysuria urgency frequency of urination. Patient does not have history of CAD. He drinks alcohol occasionally had 3 beers today while playing golf. Also smokes 1 pack cigars per day.  Allergies:  No Known Allergies    Past Medical History  Diagnosis Date  . Hypertension   . GERD (gastroesophageal reflux disease)   . Hepatitis C     states this has been treated and is clear  . Neck pain   . Alcohol abuse     Past Surgical History  Procedure Laterality Date  . Cholecystectomy    . Knee arthroscopy Right   . Hernia repair    . Colonoscopy N/A 01/30/2013    Procedure: COLONOSCOPY;  Surgeon: Rogene Houston, MD;  [facility];  Service: Endoscopy;  Laterality: N/A;  100-moved to 12:00 Ann to notify pt  . Vasectomy    . Cervical fusion  06/2013  . Lumbar laminectomy/decompression microdiscectomy Left 06/03/2014    Procedure: LUMBAR FIVE-SACRAL ONE LUMBAR LAMINECTOMY/DECOMPRESSION MICRODISCECTOMY 1 LEVEL;  Surgeon: Hosie Spangle, MD;  Location: [place] [facility];  Service: Neurosurgery;  Laterality: Left;  Left L5S1 laminotomy and microdiskectomy    Prior to Admission medications   Medication Sig Start Date End Date Taking? Authorizing Provider  olmesartan (BENICAR) 40 MG tablet Take 40 mg by mouth daily.   Yes Historical Provider, MD    HYDROcodone-acetaminophen (NORCO/VICODIN) 5-325 MG per tablet Take 1 tablet by mouth every 6 (six) hours as needed for moderate pain (every 4 - 6 hours prn).    Historical Provider, MD  naproxen sodium (ANAPROX) 220 MG tablet Take 220 mg by mouth 2 (two) times daily as needed (pain).     Historical Provider, MD    Social History:  reports that he has been smoking Cigarettes.  He has a 25 pack-year smoking history. He has never used smokeless tobacco. He reports that he drinks about 8.4 oz of alcohol per week. He reports that he does not use illicit drugs.  Family History  Problem Relation Age of Onset  . Colon cancer Neg Hx   . Breast cancer Mother   . Dementia Father     Danley Danker Weights   12/12/14 1947  Weight: 73.936 kg (163 lb)    All the positives are listed in BOLD  Review of Systems:  HEENT: Headache, blurred vision, runny nose, sore throat Neck: Hypothyroidism, hyperthyroidism,,lymphadenopathy Chest : Shortness of breath, history of COPD, Asthma Heart : Chest pain, history of coronary arterey disease GI:  Nausea, vomiting, diarrhea, constipation, GERD GU: Dysuria, urgency, frequency of urination, hematuria Neuro: Stroke, seizures, syncope Psych: Depression, anxiety, hallucinations   Physical Exam: Blood pressure 119/79, pulse 76, temperature 97.2 F (36.2 C), temperature source Axillary, resp. rate 14, height 5\' 6"  (1.676 m), weight 73.936 kg (163 lb), SpO2 98 %. Constitutional:  Patient is a well-developed and well-nourished male* in no acute distress and cooperative with exam. Head: Normocephalic and atraumatic Mouth: Mucus membranes moist Eyes: PERRL, EOMI, conjunctivae normal Neck: Supple, No Thyromegaly Cardiovascular: RRR, S1 normal, S2 normal Pulmonary/Chest: CTAB, no wheezes, rales, or rhonchi Abdominal: Soft. Non-tender, non-distended, bowel sounds are normal, no masses, organomegaly, or guarding present.  Neurological: A&O x3, Strength is normal and  symmetric bilaterally, cranial nerve II-XII are grossly intact, no focal motor deficit, sensory intact to light touch bilaterally.  Extremities : No Cyanosis, Clubbing or Edema  Labs on Admission:  Basic Metabolic Panel:  Recent Labs Lab 12/12/14 2007  NA 140  K 3.9  CL 103  GLUCOSE 118*  BUN 6  CREATININE 1.10   Liver Function Tests:  Recent Labs Lab 12/12/14 1955  AST 21  ALT 19  ALKPHOS 70  BILITOT 0.8  PROT 7.8  ALBUMIN 4.3    Recent Labs Lab 12/12/14 1955  LIPASE 24   No results for input(s): AMMONIA in the last 168 hours. CBC:  Recent Labs Lab 12/12/14 1955 12/12/14 2007  WBC 11.2*  --   NEUTROABS 5.1  --   HGB 17.7* 19.0*  HCT 51.1 56.0*  MCV 93.8  --   PLT 238  --     Radiological Exams on Admission: Dg Chest Port 1 View  12/12/2014   CLINICAL DATA:  Chest and upper abdominal pain. History of hepatitis-C.  EXAM: PORTABLE CHEST - 1 VIEW  COMPARISON:  02/27/2011  FINDINGS: Lower cervical spine fixation. Midline trachea. Mild cardiomegaly. No pleural effusion or pneumothorax. Diffuse peribronchial thickening. Somewhat more focal increased density at the right lung base. No free intraperitoneal air.  IMPRESSION: Mild cardiomegaly with chronic interstitial thickening, likely related to COPD/ chronic bronchitis.  Somewhat more focal right base opacity, for which infection or atelectasis is a concern. Consider PA and lateral radiographs.   Electronically Signed   By: Abigail Miyamoto M.D.   On: 12/12/2014 20:47    EKG: Independently reviewed. Normal sinus rhythm   Assessment/Plan Active Problems:   HTN (hypertension)   Chest pain   GERD  Chest pain Patient will be admitted for chest pain rule out ACS, will cycle the cardiac enzymes troponin every 6 hours 3. First set of troponin in the ED is negative. Morphine when necessary for pain.  GERD Patient has history of GERD/hiatal hernia, start IV Protonix. Maalox when necessary for heartburn Patient  can  be discharged on PPI once stable  Atelectasis Chest x-ray shows focal right base opacity for which infection versus atelectasis, patient does not have fever no high white count. No concern for pneumonia clinically at this time. Will monitor  Tobacco abuse Counseled  against tobacco abuse  Code status: Full code  Family discussion: Admission, patients condition and plan of care including tests being ordered have been discussed with the patient and his wife at bedside* who indicate understanding and agree with the plan and Code Status.   Time Spent on Admission: 60 min  [facility] Pager: [phone] 12/12/2014, 11:51 PM  If 7PM-7AM, please contact night-coverage  [email or web address]  Password [email or web address]

## 2014-12-13 ENCOUNTER — Observation Stay (HOSPITAL_BASED_OUTPATIENT_CLINIC_OR_DEPARTMENT_OTHER): Payer: BLUE CROSS/BLUE SHIELD

## 2014-12-13 DIAGNOSIS — F1010 Alcohol abuse, uncomplicated: Secondary | ICD-10-CM | POA: Diagnosis not present

## 2014-12-13 DIAGNOSIS — R079 Chest pain, unspecified: Secondary | ICD-10-CM | POA: Insufficient documentation

## 2014-12-13 DIAGNOSIS — K219 Gastro-esophageal reflux disease without esophagitis: Secondary | ICD-10-CM

## 2014-12-13 DIAGNOSIS — I10 Essential (primary) hypertension: Secondary | ICD-10-CM | POA: Diagnosis not present

## 2014-12-13 LAB — CBC
HCT: 45.6 % (ref 39.0–52.0)
HEMATOCRIT: 46.1 % (ref 39.0–52.0)
HEMOGLOBIN: 15.8 g/dL (ref 13.0–17.0)
HEMOGLOBIN: 15.9 g/dL (ref 13.0–17.0)
MCH: 32.2 pg (ref 26.0–34.0)
MCH: 32.6 pg (ref 26.0–34.0)
MCHC: 34.9 g/dL (ref 30.0–36.0)
MCHC: 34.3 g/dL (ref 30.0–36.0)
MCV: 93.4 fL (ref 78.0–100.0)
MCV: 94.1 fL (ref 78.0–100.0)
Platelets: 185 10*3/uL (ref 150–400)
Platelets: 196 10*3/uL (ref 150–400)
RBC: 4.88 MIL/uL (ref 4.22–5.81)
RBC: 4.9 MIL/uL (ref 4.22–5.81)
RDW: 12.7 % (ref 11.5–15.5)
RDW: 12.7 % (ref 11.5–15.5)
WBC: 11.1 10*3/uL — AB (ref 4.0–10.5)
WBC: 9.2 10*3/uL (ref 4.0–10.5)

## 2014-12-13 LAB — COMPREHENSIVE METABOLIC PANEL
ALT: 16 U/L — ABNORMAL LOW (ref 17–63)
AST: 20 U/L (ref 15–41)
Albumin: 3.2 g/dL — ABNORMAL LOW (ref 3.5–5.0)
Alkaline Phosphatase: 56 U/L (ref 38–126)
Anion gap: 7 (ref 5–15)
BUN: 7 mg/dL (ref 6–20)
CO2: 24 mmol/L (ref 22–32)
CREATININE: 0.83 mg/dL (ref 0.61–1.24)
Calcium: 7.9 mg/dL — ABNORMAL LOW (ref 8.9–10.3)
Chloride: 110 mmol/L (ref 101–111)
GFR calc non Af Amer: >60 mL/min (ref >60)
Glucose, Bld: 99 mg/dL (ref 65–99)
Potassium: 3.5 mmol/L (ref 3.5–5.1)
SODIUM: 141 mmol/L (ref 135–145)
Total Bilirubin: 0.5 mg/dL (ref 0.3–1.2)
Total Protein: 6 g/dL — ABNORMAL LOW (ref 6.5–8.1)

## 2014-12-13 LAB — TROPONIN I

## 2014-12-13 LAB — CREATININE, SERUM
CREATININE: 0.75 mg/dL (ref 0.61–1.24)
GFR calc Af Amer: >60 mL/min (ref >60)
GFR calc non Af Amer: >60 mL/min (ref >60)

## 2014-12-13 MED ORDER — ENOXAPARIN SODIUM 40 MG/0.4ML ~~LOC~~ SOLN
40.0000 mg | SUBCUTANEOUS | Status: DC
  Filled 2014-12-13: qty 0.4

## 2014-12-13 MED ORDER — MORPHINE SULFATE 2 MG/ML IJ SOLN: 2.0000 mg | INTRAMUSCULAR | Status: DC | PRN

## 2014-12-13 MED ORDER — PANTOPRAZOLE SODIUM 40 MG IV SOLR
40.0000 mg | INTRAVENOUS | Status: DC
  Administered 2014-12-13: 40 mg via INTRAVENOUS
  Filled 2014-12-13: qty 40

## 2014-12-13 MED ORDER — RANITIDINE HCL 150 MG PO TABS
150.0000 mg | ORAL_TABLET | Freq: Two times a day (BID) | ORAL | Status: DC | PRN
Start: 2014-12-13 — End: 2019-02-18

## 2014-12-13 MED ORDER — ALUM & MAG HYDROXIDE-SIMETH 200-200-20 MG/5ML PO SUSP: 15.0000 mL | ORAL | Status: DC | PRN

## 2014-12-13 MED ORDER — ONDANSETRON HCL 4 MG/2ML IJ SOLN: 4.0000 mg | Freq: Four times a day (QID) | INTRAMUSCULAR | Status: DC | PRN

## 2014-12-13 MED ORDER — PANTOPRAZOLE SODIUM 40 MG PO TBEC: 40.0000 mg | DELAYED_RELEASE_TABLET | Freq: Every day | ORAL | Status: DC | PRN

## 2014-12-13 MED ORDER — ONDANSETRON HCL 4 MG PO TABS
4.0000 mg | ORAL_TABLET | Freq: Four times a day (QID) | ORAL | Status: DC | PRN
Start: 2014-12-13 — End: 2014-12-13

## 2014-12-13 NOTE — Progress Notes (Signed)
  Echocardiogram 2D Echocardiogram has been performed.  Lysle Rubens 12/13/2014, 3:29 PM

## 2014-12-13 NOTE — Progress Notes (Signed)
NURSING PROGRESS NOTE  HERNDON GRILL [id number] Discharge Data: 12/13/2014 3:07 PM Attending Provider: No att. providers found [facility], [facility], MD   Gavin Potters to be D/C'd Home per MD order.    All IV's discontinued and monitored for bleeding.  All belongings returned to patient for patient to take home.  Cardiac monitor discontinued.  Echocardiogram performed.  Patient left floor ambulatory, escorted by RN.  Last Documented Vital Signs:  Blood pressure 114/70, pulse 67, temperature 98 F (36.7 C), temperature source Oral, resp. rate 16, height 5\' 6"  (1.676 m), weight 72.167 kg (159 lb 1.6 oz), SpO2 95 %.  Cecilie Kicks D

## 2014-12-13 NOTE — Discharge Summary (Signed)
Physician Discharge Summary  Micheal Serrano:[id number] DOB: February 03, 1956 DOA: 12/12/2014  PCP: Micheal Cahill, MD  Admit date: 12/12/2014 Discharge date: 12/13/2014  Time spent: 71minutes  Recommendations for Outpatient Follow-up:  1. Follow up with primary care physician for outpatient referral for stress testing  Discharge Diagnoses:  Active Problems:   HTN (hypertension)   Chest pain   Alcohol abuse   Pain in the chest   Esophageal reflux   Discharge Condition: improved  Diet recommendation: low salt  Filed Weights   12/12/14 1947 12/12/14 2300  Weight: 73.936 kg (163 lb) 72.167 kg (159 lb 1.6 oz)    History of present illness:  This patient presented to the hospital with chest pain that was substernal, heaviness in chest that was associated with nausea, vomiting and diaphoresis. He was admitted to the hospital for further evaluation  Hospital Course:  Patient was monitored on telemetry. He ruled out for ACS with negative cardiac markers. EKG was non acute. Echocardiogram was also unremarkable. Patient reported significant relief of his symptoms after he had a GI cocktail in the ED. He did not have any recurrence of his symptoms after that. I suspect his chest discomfort may have been related to GERD. Although he does have cardiac risk factors including hypertension, age and smoking. He has been advised to follow up with his primary care doctor for referral for stress testing. He will also follow up with his gastroenterologist. He has received a prescription for protonix and zantac. He has been advised to avoid NSAIDs. He is otherwise stable for discharge.  Procedures:    Consultations:    Discharge Exam: Filed Vitals:   12/13/14 0700  BP: 114/70  Pulse: 67  Temp: 98 F (36.7 C)  Resp: 16    General: NAD Cardiovascular: S1, S2 RRR Respiratory: CTA B  Discharge Instructions   Discharge Instructions    Diet - low sodium heart healthy    Complete by:  As  directed      Increase activity slowly    Complete by:  As directed           Discharge Medication List as of 12/13/2014 10:55 AM    START taking these medications   Details  pantoprazole (PROTONIX) 40 MG tablet Take 1 tablet (40 mg total) by mouth daily as needed (acid reflux)., Starting 12/13/2014, Until Discontinued, Print    ranitidine (ZANTAC) 150 MG tablet Take 1 tablet (150 mg total) by mouth 2 (two) times daily as needed for heartburn., Starting 12/13/2014, Until Discontinued, Print      CONTINUE these medications which have NOT CHANGED   Details  olmesartan (BENICAR) 40 MG tablet Take 40 mg by mouth daily., Until Discontinued, Historical Med    HYDROcodone-acetaminophen (NORCO/VICODIN) 5-325 MG per tablet Take 1 tablet by mouth every 6 (six) hours as needed for moderate pain (every 4 - 6 hours prn)., Until Discontinued, Historical Med      STOP taking these medications     naproxen sodium (ANAPROX) 220 MG tablet        No Known Allergies Follow-up Information    Follow up with Micheal Houston, MD.   Specialty:  Gastroenterology   Why:  for possible endoscopy   Contact information:   [address], [address] [place] [address] [phone]       Follow up with Micheal Cahill, MD. Schedule an appointment as soon as possible for a visit in 2 [place].   Specialty:  Internal Medicine  Why:  follow up for referral for stress test   Contact information:    [place] [address] [phone]        The results of significant diagnostics from this hospitalization (including imaging, microbiology, ancillary and laboratory) are listed below for reference.    Significant Diagnostic Studies: Dg Chest Port 1 View  12/12/2014   CLINICAL DATA:  Chest and upper abdominal pain. History of hepatitis-C.  EXAM: PORTABLE CHEST - 1 VIEW  COMPARISON:  02/27/2011  FINDINGS: Lower cervical spine fixation. Midline trachea. Mild cardiomegaly. No pleural effusion or  pneumothorax. Diffuse peribronchial thickening. Somewhat more focal increased density at the right lung base. No free intraperitoneal air.  IMPRESSION: Mild cardiomegaly with chronic interstitial thickening, likely related to COPD/ chronic bronchitis.  Somewhat more focal right base opacity, for which infection or atelectasis is a concern. Consider PA and lateral radiographs.   Electronically Signed   By: Micheal Serrano M.D.   On: 12/12/2014 20:47    Microbiology: No results found for this or any previous visit (from the past 240 hour(s)).   Labs: Basic Metabolic Panel:  Recent Labs Lab 12/12/14 2007 12/13/14 0053 12/13/14 0445  NA 140  --  141  K 3.9  --  3.5  CL 103  --  110  CO2  --   --  24  GLUCOSE 118*  --  99  BUN 6  --  7  CREATININE 1.10 0.75 0.83  CALCIUM  --   --  7.9*   Liver Function Tests:  Recent Labs Lab 12/12/14 1955 12/13/14 0445  AST 21 20  ALT 19 16*  ALKPHOS 70 56  BILITOT 0.8 0.5  PROT 7.8 6.0*  ALBUMIN 4.3 3.2*    Recent Labs Lab 12/12/14 1955  LIPASE 24   No results for input(s): AMMONIA in the last 168 hours. CBC:  Recent Labs Lab 12/12/14 1955 12/12/14 2007 12/13/14 0053 12/13/14 0445  WBC 11.2*  --  11.1* 9.2  NEUTROABS 5.1  --   --   --   HGB 17.7* 19.0* 15.9 15.8  HCT 51.1 56.0* 45.6 46.1  MCV 93.8  --  93.4 94.1  PLT 238  --  185 196   Cardiac Enzymes:  Recent Labs Lab 12/13/14 0053 12/13/14 0644 12/13/14 1208  TROPONINI <0.03 <0.03 <0.03   BNP: BNP (last 3 results) No results for input(s): BNP in the last 8760 hours.  ProBNP (last 3 results) No results for input(s): PROBNP in the last 8760 hours.  CBG: No results for input(s): GLUCAP in the last 168 hours.     Signed:  [name]  [facility] 12/13/2014, 4:44 PM

## 2015-10-18 DIAGNOSIS — I10 Essential (primary) hypertension: Secondary | ICD-10-CM | POA: Diagnosis not present

## 2015-10-30 DIAGNOSIS — Z720 Tobacco use: Secondary | ICD-10-CM | POA: Diagnosis not present

## 2015-10-30 DIAGNOSIS — M79605 Pain in left leg: Secondary | ICD-10-CM | POA: Diagnosis not present

## 2015-10-30 DIAGNOSIS — I10 Essential (primary) hypertension: Secondary | ICD-10-CM | POA: Diagnosis not present

## 2015-12-03 DIAGNOSIS — L821 Other seborrheic keratosis: Secondary | ICD-10-CM | POA: Diagnosis not present

## 2015-12-03 DIAGNOSIS — D225 Melanocytic nevi of trunk: Secondary | ICD-10-CM | POA: Diagnosis not present

## 2015-12-03 DIAGNOSIS — L570 Actinic keratosis: Secondary | ICD-10-CM | POA: Diagnosis not present

## 2015-12-03 DIAGNOSIS — X32XXXD Exposure to sunlight, subsequent encounter: Secondary | ICD-10-CM | POA: Diagnosis not present

## 2016-06-30 DIAGNOSIS — X32XXXD Exposure to sunlight, subsequent encounter: Secondary | ICD-10-CM | POA: Diagnosis not present

## 2016-06-30 DIAGNOSIS — L570 Actinic keratosis: Secondary | ICD-10-CM | POA: Diagnosis not present

## 2016-06-30 DIAGNOSIS — L820 Inflamed seborrheic keratosis: Secondary | ICD-10-CM | POA: Diagnosis not present

## 2016-10-27 DIAGNOSIS — M25571 Pain in right ankle and joints of right foot: Secondary | ICD-10-CM | POA: Diagnosis not present

## 2016-11-08 DIAGNOSIS — Z0000 Encounter for general adult medical examination without abnormal findings: Secondary | ICD-10-CM | POA: Diagnosis not present

## 2016-11-08 DIAGNOSIS — I10 Essential (primary) hypertension: Secondary | ICD-10-CM | POA: Diagnosis not present

## 2016-11-08 DIAGNOSIS — Z125 Encounter for screening for malignant neoplasm of prostate: Secondary | ICD-10-CM | POA: Diagnosis not present

## 2016-11-10 DIAGNOSIS — I10 Essential (primary) hypertension: Secondary | ICD-10-CM | POA: Diagnosis not present

## 2016-11-10 DIAGNOSIS — Z0000 Encounter for general adult medical examination without abnormal findings: Secondary | ICD-10-CM | POA: Diagnosis not present

## 2016-11-10 DIAGNOSIS — E781 Pure hyperglyceridemia: Secondary | ICD-10-CM | POA: Diagnosis not present

## 2016-11-10 DIAGNOSIS — Z720 Tobacco use: Secondary | ICD-10-CM | POA: Diagnosis not present

## 2017-05-08 DIAGNOSIS — H2513 Age-related nuclear cataract, bilateral: Secondary | ICD-10-CM | POA: Diagnosis not present

## 2017-05-08 DIAGNOSIS — H353221 Exudative age-related macular degeneration, left eye, with active choroidal neovascularization: Secondary | ICD-10-CM | POA: Diagnosis not present

## 2017-05-08 DIAGNOSIS — H353112 Nonexudative age-related macular degeneration, right eye, intermediate dry stage: Secondary | ICD-10-CM | POA: Diagnosis not present

## 2017-05-18 DIAGNOSIS — H353221 Exudative age-related macular degeneration, left eye, with active choroidal neovascularization: Secondary | ICD-10-CM | POA: Diagnosis not present

## 2017-05-27 ENCOUNTER — Encounter (HOSPITAL_COMMUNITY): Payer: Self-pay | Admitting: Emergency Medicine

## 2017-05-27 ENCOUNTER — Emergency Department (HOSPITAL_COMMUNITY)
Admission: EM | Admit: 2017-05-27 | Discharge: 2017-05-27 | Disposition: A | Discharge status: Home / Self Care | Payer: BLUE CROSS/BLUE SHIELD | Attending: Emergency Medicine | Admitting: Emergency Medicine

## 2017-05-27 ENCOUNTER — Emergency Department (HOSPITAL_COMMUNITY): Payer: BLUE CROSS/BLUE SHIELD

## 2017-05-27 ENCOUNTER — Other Ambulatory Visit: Payer: Self-pay

## 2017-05-27 DIAGNOSIS — F17210 Nicotine dependence, cigarettes, uncomplicated: Secondary | ICD-10-CM | POA: Insufficient documentation

## 2017-05-27 DIAGNOSIS — R52 Pain, unspecified: Secondary | ICD-10-CM

## 2017-05-27 DIAGNOSIS — M778 Other enthesopathies, not elsewhere classified: Secondary | ICD-10-CM

## 2017-05-27 DIAGNOSIS — I10 Essential (primary) hypertension: Secondary | ICD-10-CM | POA: Insufficient documentation

## 2017-05-27 DIAGNOSIS — M7581 Other shoulder lesions, right shoulder: Secondary | ICD-10-CM | POA: Insufficient documentation

## 2017-05-27 DIAGNOSIS — M25511 Pain in right shoulder: Secondary | ICD-10-CM | POA: Diagnosis not present

## 2017-05-27 DIAGNOSIS — M7501 Adhesive capsulitis of right shoulder: Secondary | ICD-10-CM | POA: Diagnosis not present

## 2017-05-27 DIAGNOSIS — Z79899 Other long term (current) drug therapy: Secondary | ICD-10-CM | POA: Insufficient documentation

## 2017-05-27 MED ORDER — HYDROCODONE-ACETAMINOPHEN 5-325 MG PO TABS: ORAL_TABLET | ORAL | 0 refills | Status: DC

## 2017-05-27 NOTE — ED Triage Notes (Signed)
Patient c/o right arm pain with no known injury. Per patient pain x4 days. Tender to touch. Denies any radiation of pain. Denies any swelling in arm. CNS intact, capillary refill WNL, radial pulse strong. Per patient tried taking flexeril which helped relieve pain briefly.

## 2017-05-27 NOTE — ED Notes (Signed)
Pt transported to xray 

## 2017-05-27 NOTE — Discharge Instructions (Signed)
Apply ice packs on/off to your shoulder.  You can continue taking her diclofenac as directed, but take with food and you may also want to take Zantac while taking it.  Call Dr. Ruthe Mannan office tomorrow to arrange a follow-up appointment

## 2017-05-29 ENCOUNTER — Ambulatory Visit (INDEPENDENT_AMBULATORY_CARE_PROVIDER_SITE_OTHER): Payer: BLUE CROSS/BLUE SHIELD | Admitting: Orthopaedic Surgery

## 2017-05-29 ENCOUNTER — Encounter: Payer: Self-pay | Admitting: Orthopaedic Surgery

## 2017-05-29 VITALS — BP 142/82 | HR 79 | Temp 97.3°F | Ht 66.0 in | Wt 161.0 lb

## 2017-05-29 DIAGNOSIS — M25511 Pain in right shoulder: Secondary | ICD-10-CM | POA: Diagnosis not present

## 2017-05-29 MED ORDER — HYDROCODONE-ACETAMINOPHEN 5-325 MG PO TABS: ORAL_TABLET | ORAL | 0 refills | Status: DC

## 2017-05-29 NOTE — Patient Instructions (Signed)
Steps to Quit Smoking Smoking tobacco can be bad for your health. It can also affect almost every organ in your body. Smoking puts you and people around you at risk for many serious long-lasting (chronic) diseases. Quitting smoking is hard, but it is one of the best things that you can do for your health. It is never too late to quit. What are the benefits of quitting smoking? When you quit smoking, you lower your risk for getting serious diseases and conditions. They can include:  Lung cancer or lung disease.  Heart disease.  Stroke.  Heart attack.  Not being able to have children (infertility).  Weak bones (osteoporosis) and broken bones (fractures).  If you have coughing, wheezing, and shortness of breath, those symptoms may get better when you quit. You may also get sick less often. If you are pregnant, quitting smoking can help to lower your chances of having a baby of low birth weight. What can I do to help me quit smoking? Talk with your doctor about what can help you quit smoking. Some things you can do (strategies) include:  Quitting smoking totally, instead of slowly cutting back how much you smoke over a period of time.  Going to in-person counseling. You are more likely to quit if you go to many counseling sessions.  Using resources and support systems, such as: ? Online chats with a counselor. ? Phone quitlines. ? Printed self-help materials. ? Support groups or group counseling. ? Text messaging programs. ? Mobile phone apps or applications.  Taking medicines. Some of these medicines may have nicotine in them. If you are pregnant or breastfeeding, do not take any medicines to quit smoking unless your doctor says it is okay. Talk with your doctor about counseling or other things that can help you.  Talk with your doctor about using more than one strategy at the same time, such as taking medicines while you are also going to in-person counseling. This can help make  quitting easier. What things can I do to make it easier to quit? Quitting smoking might feel very hard at first, but there is a lot that you can do to make it easier. Take these steps:  Talk to your family and friends. Ask them to support and encourage you.  Call phone quitlines, reach out to support groups, or work with a counselor.  Ask people who smoke to not smoke around you.  Avoid places that make you want (trigger) to smoke, such as: ? Bars. ? Parties. ? Smoke-break areas at work.  Spend time with people who do not smoke.  Lower the stress in your life. Stress can make you want to smoke. Try these things to help your stress: ? Getting regular exercise. ? Deep-breathing exercises. ? Yoga. ? Meditating. ? Doing a body scan. To do this, close your eyes, focus on one area of your body at a time from head to toe, and notice which parts of your body are tense. Try to relax the muscles in those areas.  Download or buy apps on your mobile phone or tablet that can help you stick to your quit plan. There are many free apps, such as QuitGuide from the CDC (Centers for Disease Control and Prevention). You can find more support from smokefree.gov and other websites.  This information is not intended to replace advice given to you by your health care provider. Make sure you discuss any questions you have with your health care provider. Document Released: 03/25/2009 Document   Revised: 01/25/2016 Document Reviewed: 10/13/2014 Elsevier Interactive Patient Education  2018 Elsevier Inc.  

## 2017-05-29 NOTE — Progress Notes (Signed)
Subjective:    Patient ID: Micheal Serrano, male    DOB: 12/21/1955, 61 y.o.   MRN: [id number]  HPI He has been having pain in the right shoulder for about a week.  It got worse over the weekend and he went to the ER on 05-27-17.  He was given pain medicines after X-rays were done.  X-rays were negative.  He has no trauma, no redness, no trauma.  He has taken Advil, Aleve, diclofenac and Tylenol with no help.  He has tried ice, heat and rubs with no help. He is better today.  I have reviewed the ER records, the X-rays and X-rays report.   Review of Systems  Musculoskeletal: Positive for arthralgias and myalgias.  All other systems reviewed and are negative.  Past Medical History:  Diagnosis Date  . Alcohol abuse   . GERD (gastroesophageal reflux disease)   . Hepatitis C    states this has been treated and is clear  . Hypertension   . Neck pain     Past Surgical History:  Procedure Laterality Date  . CERVICAL FUSION  06/2013  . CHOLECYSTECTOMY    . COLONOSCOPY N/A 01/30/2013   Procedure: COLONOSCOPY;  Surgeon: Rogene Houston, MD;  Location[facility];  Service: Endoscopy;  Laterality: N/A;  100-moved to 12:00 Ann to notify pt  . HERNIA REPAIR    . KNEE ARTHROSCOPY Right   . LUMBAR LAMINECTOMY/DECOMPRESSION MICRODISCECTOMY Left 06/03/2014   Procedure: LUMBAR FIVE-SACRAL ONE LUMBAR LAMINECTOMY/DECOMPRESSION MICRODISCECTOMY 1 LEVEL;  Surgeon: Hosie Spangle, MD;  Location: [place] [facility];  Service: Neurosurgery;  Laterality: Left;  Left L5S1 laminotomy and microdiskectomy  . VASECTOMY      Current Outpatient Medications on File Prior to Visit  Medication Sig Dispense Refill  . olmesartan (BENICAR) 40 MG tablet Take 40 mg by mouth daily.    . pantoprazole (PROTONIX) 40 MG tablet Take 1 tablet (40 mg total) by mouth daily as needed (acid reflux). 30 tablet 0  . ranitidine (ZANTAC) 150 MG tablet Take 1 tablet (150 mg total) by mouth 2 (two) times daily as needed for  heartburn. 30 tablet 0   No current facility-administered medications on file prior to visit.     Social History   Socioeconomic History  . Marital status: Married    Spouse name: Not on file  . Number of children: Not on file  . Years of education: Not on file  . Highest education level: Not on file  Social Needs  . Financial resource strain: Not on file  . Food insecurity - worry: Not on file  . Food insecurity - inability: Not on file  . Transportation needs - medical: Not on file  . Transportation needs - non-medical: Not on file  Occupational History  . Not on file  Tobacco Use  . Smoking status: Current Every Day Smoker    Packs/day: 1.00    Years: 25.00    Pack years: 25.00    Types: Cigarettes  . Smokeless tobacco: Never Used  . Tobacco comment: 1 pack a day x  x 30 yrs  Substance and Sexual Activity  . Alcohol use: Yes    Alcohol/week: 8.4 oz    Types: 14 Cans of beer per week    Comment: 2-3 beers a day  . Drug use: No  . Sexual activity: Not on file  Other Topics Concern  . Not on file  Social History Narrative  . Not on file  Family History  Problem Relation Age of Onset  . Breast cancer Mother   . Thyroid disease Mother   . Stroke Mother   . Dementia Father   . Colon cancer Neg Hx     BP (!) 142/82   Pulse 79   Temp (!) 97.3 F (36.3 C)   Ht 5\' 6"  (1.676 m)   Wt 161 lb (73 kg)   BMI 25.99 kg/m       Objective:   Physical Exam  Constitutional: He is oriented to person, place, and time. He appears well-developed and well-nourished.  HENT:  Head: Normocephalic and atraumatic.  Eyes: Conjunctivae and EOM are normal. Pupils are equal, round, and reactive to light.  Neck: Normal range of motion. Neck supple.  Cardiovascular: Normal rate, regular rhythm and intact distal pulses.  Pulmonary/Chest: Effort normal.  Abdominal: Soft.  Musculoskeletal: He exhibits tenderness (Right shoulder tender but has full ROM.  Pain in the extremes.  NV  intact.  He has no swelling, no redness,  Neck negative.  Grips normal.  Left shoulder negative.).  Neurological: He is alert and oriented to person, place, and time. He has normal reflexes. He displays normal reflexes. No cranial nerve deficit. He exhibits normal muscle tone. Coordination normal.  Skin: Skin is warm and dry.  Psychiatric: He has a normal mood and affect. His behavior is normal. Judgment and thought content normal.  Vitals reviewed.         Assessment & Plan:   Encounter Diagnosis  Name Primary?  . Acute pain of right shoulder Yes   I feel he has bursitis and tendinitis of the right shoulder.  I have given Rx for pain medicine.  I have reviewed the [facility] web site prior to prescribing narcotic medicine for this patient.  PROCEDURE NOTE:  The patient request injection, verbal consent was obtained.  The right shoulder was prepped appropriately after time out was performed.   Sterile technique was observed and injection of 1 cc of Depo-Medrol 40 mg with several cc's of plain xylocaine. Anesthesia was provided by ethyl chloride and a 20-gauge needle was used to inject the shoulder area. A posterior approach was used.  The injection was tolerated well.  A band aid dressing was applied.  The patient was advised to apply ice later today and tomorrow to the injection sight as needed.  Return in two [place].  Call if any problem.  Precautions discussed.   Electronically Signed Sanjuana Kava, MD 12/18/20188:32 AM

## 2017-05-29 NOTE — ED Provider Notes (Signed)
[facility] EMERGENCY DEPARTMENT Provider Note   CSN: [id number] Arrival date & time: 05/27/17  1100     History   Chief Complaint Chief Complaint  Patient presents with  . Arm Pain    HPI Micheal Serrano is a 61 y.o. male.  HPI   Micheal Serrano is a 61 y.o. male who presents to the Emergency Department complaining of right shoulder pain that has been worsening for 4 days.  He describes an aching pain to the lateral shoulder joint and to the upper arm.  Pain associated with movement and reaching.  He admits to repetitive movements of the arm and he is right hand dominant.  He has tried flexeril w/o relief.  He denies known injury, neck pain, headache, numbness weakness of the arm and fingers.  No chest pain.    Past Medical History:  Diagnosis Date  . Alcohol abuse   . GERD (gastroesophageal reflux disease)   . Hepatitis C    states this has been treated and is clear  . Hypertension   . Neck pain     Patient Active Problem List   Diagnosis Date Noted  . Alcohol abuse   . Pain in the chest   . Esophageal reflux   . Chest pain 12/12/2014  . HNP (herniated nucleus pulposus), lumbar 06/03/2014  . Hepatitis, chronic active (Start) 01/06/2013  . Hypokalemia 02/28/2011  . Tobacco abuse 02/28/2011  . GERD (gastroesophageal reflux disease) 02/28/2011  . Transaminitis 02/28/2011  . IFG (impaired fasting glucose) 02/28/2011  . Chest pain 02/27/2011  . HTN (hypertension) 02/27/2011    Past Surgical History:  Procedure Laterality Date  . CERVICAL FUSION  06/2013  . CHOLECYSTECTOMY    . COLONOSCOPY N/A 01/30/2013   Procedure: COLONOSCOPY;  Surgeon: Rogene Houston, MD;  [facility];  Service: Endoscopy;  Laterality: N/A;  100-moved to 12:00 Ann to notify pt  . HERNIA REPAIR    . KNEE ARTHROSCOPY Right   . LUMBAR LAMINECTOMY/DECOMPRESSION MICRODISCECTOMY Left 06/03/2014   Procedure: LUMBAR FIVE-SACRAL ONE LUMBAR LAMINECTOMY/DECOMPRESSION MICRODISCECTOMY 1 LEVEL;   Surgeon: Hosie Spangle, MD;  Location: [place] [facility];  Service: Neurosurgery;  Laterality: Left;  Left L5S1 laminotomy and microdiskectomy  . VASECTOMY         Home Medications    Prior to Admission medications   Medication Sig Start Date End Date Taking? Authorizing Provider  HYDROcodone-acetaminophen (NORCO/VICODIN) 5-325 MG tablet One tablet every four hours as needed for acute pain.  Limit of five days per  statue. 05/29/17   Sanjuana Kava, MD  olmesartan (BENICAR) 40 MG tablet Take 40 mg by mouth daily.    [provider]  pantoprazole (PROTONIX) 40 MG tablet Take 1 tablet (40 mg total) by mouth daily as needed (acid reflux). 12/13/14   Kathie Dike, MD  ranitidine (ZANTAC) 150 MG tablet Take 1 tablet (150 mg total) by mouth 2 (two) times daily as needed for heartburn. 12/13/14   Kathie Dike, MD    Family History Family History  Problem Relation Age of Onset  . Breast cancer Mother   . Thyroid disease Mother   . Stroke Mother   . Dementia Father   . Colon cancer Neg Hx     Social History Social History   Tobacco Use  . Smoking status: Current Every Day Smoker    Packs/day: 1.00    Years: 25.00    Pack years: 25.00    Types: Cigarettes  . Smokeless  tobacco: Never Used  . Tobacco comment: 1 pack a day x  x 30 yrs  Substance Use Topics  . Alcohol use: Yes    Alcohol/week: 8.4 oz    Types: 14 Cans of beer per week    Comment: 2-3 beers a day  . Drug use: No     Allergies   Patient has no known allergies.   Review of Systems Review of Systems  Constitutional: Negative for chills and fever.  Eyes: Negative for visual disturbance.  Respiratory: Negative for chest tightness and shortness of breath.   Cardiovascular: Negative for chest pain.  Gastrointestinal: Negative for nausea.  Genitourinary: Negative for difficulty urinating and dysuria.  Musculoskeletal: Positive for arthralgias (right shoulder pain). Negative for joint swelling,  neck pain and neck stiffness.  Skin: Negative for color change and rash.  Neurological: Negative for dizziness, facial asymmetry, weakness, numbness and headaches.  All other systems reviewed and are negative.    Physical Exam Updated Vital Signs BP 125/79 (BP Location: Right Arm)   Pulse 75   Temp 97.9 F (36.6 C) (Oral)   Resp 18   Ht 5\' 6"  (1.676 m)   Wt 72.6 kg (160 lb)   SpO2 99%   BMI 25.82 kg/m   Physical Exam  Constitutional: He is oriented to person, place, and time. He appears well-developed and well-nourished. No distress.  HENT:  Head: Atraumatic.  Mouth/Throat: Oropharynx is clear and moist.  Eyes: EOM are normal. Pupils are equal, round, and reactive to light.  Neck: Normal range of motion and phonation normal. Neck supple. No spinous process tenderness and no muscular tenderness present. Normal range of motion present.  Cardiovascular: Normal rate, regular rhythm and intact distal pulses.  No murmur heard. Pulmonary/Chest: Effort normal and breath sounds normal. No respiratory distress.  Musculoskeletal:       Right shoulder: He exhibits bony tenderness and pain. He exhibits normal range of motion, normal pulse and normal strength.       Arms: Point tenderness to the lateral right shoulder joint.  Pain with abduction and rotation.  No edema, erythema or crepitus on ROM   Neurological: He is alert and oriented to person, place, and time. No sensory deficit.  Skin: Skin is warm. Capillary refill takes less than 2 seconds. No rash noted.  Nursing note and vitals reviewed.    ED Treatments / Results  Labs (all labs ordered are listed, but only abnormal results are displayed) Labs Reviewed - No data to display  EKG  EKG Interpretation None       Radiology Dg Shoulder Right  Result Date: 05/27/2017 CLINICAL DATA:  Right shoulder pain EXAM: RIGHT SHOULDER - 2+ VIEW COMPARISON:  None. FINDINGS: No acute fracture. No dislocation. Small bony densities are  present in the location of the supraspinatus tendon insertion. Mental objects project over the right thorax and are likely external to the patient. IMPRESSION: No acute bony injury.  Chronic changes are no Electronically Signed   By: Marybelle Killings M.D.   On: 05/27/2017 12:16     Procedures Procedures (including critical care time)  Medications Ordered in ED Medications - No data to display   Initial Impression / Assessment and Plan / ED Course  I have reviewed the triage vital signs and the nursing notes.  Pertinent labs & imaging results that were available during my care of the patient were reviewed by me and considered in my medical decision making (see chart for details).  NV intact.  No concerning sx's for septic joint or cervical radiculopathy.   Focal tenderness and XR findings suggest likely inflammatory tendonitis.  Pt agree to ice, NSAID and orthopedic f/u   Final Clinical Impressions(s) / ED Diagnoses   Final diagnoses:  Tendinitis of right shoulder    ED Discharge Orders        Ordered    HYDROcodone-acetaminophen (NORCO/VICODIN) 5-325 MG tablet  Status:  Discontinued     05/27/17 1234       Kem Parkinson, PA-C 05/29/17 1423    Julianne Rice, MD 05/30/17 [phone]

## 2017-06-14 ENCOUNTER — Ambulatory Visit (INDEPENDENT_AMBULATORY_CARE_PROVIDER_SITE_OTHER): Payer: BLUE CROSS/BLUE SHIELD | Admitting: Orthopaedic Surgery

## 2017-06-14 ENCOUNTER — Encounter: Payer: Self-pay | Admitting: Orthopaedic Surgery

## 2017-06-14 VITALS — BP 131/80 | HR 80 | Temp 96.9°F | Ht 66.0 in | Wt 157.0 lb

## 2017-06-14 DIAGNOSIS — M25511 Pain in right shoulder: Secondary | ICD-10-CM | POA: Diagnosis not present

## 2017-06-14 NOTE — Progress Notes (Signed)
Patient Micheal Serrano, male DOB:[birth year], 62 y.o. TIW:[id number]  Chief Complaint  Patient presents with  . Follow-up    Right Shoulder    HPI  Micheal Serrano is a 62 y.o. male who has had pain of the right shoulder. He is significantly improved today and has no pain, full ROM. HPI  Body mass index is 25.34 kg/m.  ROS  Review of Systems  Musculoskeletal: Positive for arthralgias and myalgias.  All other systems reviewed and are negative.   Past Medical History:  Diagnosis Date  . Alcohol abuse   . GERD (gastroesophageal reflux disease)   . Hepatitis C    states this has been treated and is clear  . Hypertension   . Neck pain     Past Surgical History:  Procedure Laterality Date  . CERVICAL FUSION  06/2013  . CHOLECYSTECTOMY    . COLONOSCOPY N/A 01/30/2013   Procedure: COLONOSCOPY;  Surgeon: Rogene Houston, MD;  [facility];  Service: Endoscopy;  Laterality: N/A;  100-moved to 12:00 Ann to notify pt  . HERNIA REPAIR    . KNEE ARTHROSCOPY Right   . LUMBAR LAMINECTOMY/DECOMPRESSION MICRODISCECTOMY Left 06/03/2014   Procedure: LUMBAR FIVE-SACRAL ONE LUMBAR LAMINECTOMY/DECOMPRESSION MICRODISCECTOMY 1 LEVEL;  Surgeon: Hosie Spangle, MD;  Location: [place] [facility];  Service: Neurosurgery;  Laterality: Left;  Left L5S1 laminotomy and microdiskectomy  . VASECTOMY      Family History  Problem Relation Age of Onset  . Breast cancer Mother   . Thyroid disease Mother   . Stroke Mother   . Dementia Father   . Colon cancer Neg Hx     Social History Social History   Tobacco Use  . Smoking status: Current Every Day Smoker    Packs/day: 1.00    Years: 25.00    Pack years: 25.00    Types: Cigarettes  . Smokeless tobacco: Never Used  . Tobacco comment: 1 pack a day x  x 30 yrs  Substance Use Topics  . Alcohol use: Yes    Alcohol/week: 8.4 oz    Types: 14 Cans of beer per week    Comment: 2-3 beers a day  . Drug use: No    No Known  Allergies  Current Outpatient Medications  Medication Sig Dispense Refill  . HYDROcodone-acetaminophen (NORCO/VICODIN) 5-325 MG tablet One tablet every four hours as needed for acute pain.  Limit of five days per [place] statue. 30 tablet 0  . olmesartan (BENICAR) 40 MG tablet Take 40 mg by mouth daily.    . pantoprazole (PROTONIX) 40 MG tablet Take 1 tablet (40 mg total) by mouth daily as needed (acid reflux). 30 tablet 0  . ranitidine (ZANTAC) 150 MG tablet Take 1 tablet (150 mg total) by mouth 2 (two) times daily as needed for heartburn. 30 tablet 0   No current facility-administered medications for this visit.      Physical Exam  Blood pressure 131/80, pulse 80, temperature (!) 96.9 F (36.1 C), height 5\' 6"  (1.676 m), weight 157 lb (71.2 kg).  Constitutional: overall normal hygiene, normal nutrition, well developed, normal grooming, normal body habitus. Assistive device:none  Musculoskeletal: gait and station Limp none, muscle tone and strength are normal, no tremors or atrophy is present.  .  Neurological: coordination overall normal.  Deep tendon reflex/nerve stretch intact.  Sensation normal.  Cranial nerves II-XII intact.   Skin:   Normal overall no scars, lesions, ulcers or rashes. No psoriasis.  Psychiatric: Alert  and oriented x 3.  Recent memory intact, remote memory unclear.  Normal mood and affect. Well groomed.  Good eye contact.  Cardiovascular: overall no swelling, no varicosities, no edema bilaterally, normal temperatures of the legs and arms, no clubbing, cyanosis and good capillary refill.  Lymphatic: palpation is normal.  All other systems reviewed and are negative   Right shoulder has full ROM and no pain.  He has no redness and NV intact.  The patient has been educated about the nature of the problem(s) and counseled on treatment options.  The patient appeared to understand what I have discussed and is in agreement with it.  Encounter Diagnosis  Name  Primary?  . Acute pain of right shoulder Yes    PLAN Call if any problems.  Precautions discussed.  Continue current medications.   Return to clinic prn   Electronically Signed Sanjuana Kava, MD 1/3/20198:39 AM

## 2017-06-14 NOTE — Patient Instructions (Signed)
Steps to Quit Smoking Smoking tobacco can be bad for your health. It can also affect almost every organ in your body. Smoking puts you and people around you at risk for many serious Micheal Serrano-lasting (chronic) diseases. Quitting smoking is hard, but it is one of the best things that you can do for your health. It is never too late to quit. What are the benefits of quitting smoking? When you quit smoking, you lower your risk for getting serious diseases and conditions. They can include:  Lung cancer or lung disease.  Heart disease.  Stroke.  Heart attack.  Not being able to have children (infertility).  Weak bones (osteoporosis) and broken bones (fractures).  If you have coughing, wheezing, and shortness of breath, those symptoms may get better when you quit. You may also get sick less often. If you are pregnant, quitting smoking can help to lower your chances of having a baby of low birth weight. What can I do to help me quit smoking? Talk with your doctor about what can help you quit smoking. Some things you can do (strategies) include:  Quitting smoking totally, instead of slowly cutting back how much you smoke over a period of time.  Going to in-person counseling. You are more likely to quit if you go to many counseling sessions.  Using resources and support systems, such as: ? Online chats with a counselor. ? Phone quitlines. ? Printed self-help materials. ? Support groups or group counseling. ? Text messaging programs. ? Mobile phone apps or applications.  Taking medicines. Some of these medicines may have nicotine in them. If you are pregnant or breastfeeding, do not take any medicines to quit smoking unless your doctor says it is okay. Talk with your doctor about counseling or other things that can help you.  Talk with your doctor about using more than one strategy at the same time, such as taking medicines while you are also going to in-person counseling. This can help make  quitting easier. What things can I do to make it easier to quit? Quitting smoking might feel very hard at first, but there is a lot that you can do to make it easier. Take these steps:  Talk to your family and friends. Ask them to support and encourage you.  Call phone quitlines, reach out to support groups, or work with a counselor.  Ask people who smoke to not smoke around you.  Avoid places that make you want (trigger) to smoke, such as: ? Bars. ? Parties. ? Smoke-break areas at work.  Spend time with people who do not smoke.  Lower the stress in your life. Stress can make you want to smoke. Try these things to help your stress: ? Getting regular exercise. ? Deep-breathing exercises. ? Yoga. ? Meditating. ? Doing a body scan. To do this, close your eyes, focus on one area of your body at a time from head to toe, and notice which parts of your body are tense. Try to relax the muscles in those areas.  Download or buy apps on your mobile phone or tablet that can help you stick to your quit plan. There are many free apps, such as QuitGuide from the CDC (Centers for Disease Control and Prevention). You can find more support from smokefree.gov and other websites.  This information is not intended to replace advice given to you by your health care provider. Make sure you discuss any questions you have with your health care provider. Document Released: 03/25/2009 Document   Revised: 01/25/2016 Document Reviewed: 10/13/2014 Elsevier Interactive Patient Education  2018 Elsevier Inc.  

## 2017-06-15 DIAGNOSIS — H353221 Exudative age-related macular degeneration, left eye, with active choroidal neovascularization: Secondary | ICD-10-CM | POA: Diagnosis not present

## 2017-06-15 DIAGNOSIS — H2513 Age-related nuclear cataract, bilateral: Secondary | ICD-10-CM | POA: Diagnosis not present

## 2017-06-15 DIAGNOSIS — H353112 Nonexudative age-related macular degeneration, right eye, intermediate dry stage: Secondary | ICD-10-CM | POA: Diagnosis not present

## 2017-07-13 DIAGNOSIS — H2513 Age-related nuclear cataract, bilateral: Secondary | ICD-10-CM | POA: Diagnosis not present

## 2017-07-13 DIAGNOSIS — H353221 Exudative age-related macular degeneration, left eye, with active choroidal neovascularization: Secondary | ICD-10-CM | POA: Diagnosis not present

## 2017-07-13 DIAGNOSIS — H353112 Nonexudative age-related macular degeneration, right eye, intermediate dry stage: Secondary | ICD-10-CM | POA: Diagnosis not present

## 2017-08-17 DIAGNOSIS — H353112 Nonexudative age-related macular degeneration, right eye, intermediate dry stage: Secondary | ICD-10-CM | POA: Diagnosis not present

## 2017-08-17 DIAGNOSIS — H353221 Exudative age-related macular degeneration, left eye, with active choroidal neovascularization: Secondary | ICD-10-CM | POA: Diagnosis not present

## 2017-08-17 DIAGNOSIS — H2513 Age-related nuclear cataract, bilateral: Secondary | ICD-10-CM | POA: Diagnosis not present

## 2017-09-17 DIAGNOSIS — X32XXXD Exposure to sunlight, subsequent encounter: Secondary | ICD-10-CM | POA: Diagnosis not present

## 2017-09-17 DIAGNOSIS — L570 Actinic keratosis: Secondary | ICD-10-CM | POA: Diagnosis not present

## 2017-09-17 DIAGNOSIS — L728 Other follicular cysts of the skin and subcutaneous tissue: Secondary | ICD-10-CM | POA: Diagnosis not present

## 2017-09-17 DIAGNOSIS — L821 Other seborrheic keratosis: Secondary | ICD-10-CM | POA: Diagnosis not present

## 2017-10-05 DIAGNOSIS — H353221 Exudative age-related macular degeneration, left eye, with active choroidal neovascularization: Secondary | ICD-10-CM | POA: Diagnosis not present

## 2017-10-05 DIAGNOSIS — H353112 Nonexudative age-related macular degeneration, right eye, intermediate dry stage: Secondary | ICD-10-CM | POA: Diagnosis not present

## 2017-10-05 DIAGNOSIS — H2513 Age-related nuclear cataract, bilateral: Secondary | ICD-10-CM | POA: Diagnosis not present

## 2017-11-16 DIAGNOSIS — I10 Essential (primary) hypertension: Secondary | ICD-10-CM | POA: Diagnosis not present

## 2017-11-16 DIAGNOSIS — M1990 Unspecified osteoarthritis, unspecified site: Secondary | ICD-10-CM | POA: Diagnosis not present

## 2017-12-07 DIAGNOSIS — H2513 Age-related nuclear cataract, bilateral: Secondary | ICD-10-CM | POA: Diagnosis not present

## 2017-12-07 DIAGNOSIS — H353112 Nonexudative age-related macular degeneration, right eye, intermediate dry stage: Secondary | ICD-10-CM | POA: Diagnosis not present

## 2017-12-07 DIAGNOSIS — H353221 Exudative age-related macular degeneration, left eye, with active choroidal neovascularization: Secondary | ICD-10-CM | POA: Diagnosis not present

## 2017-12-28 DIAGNOSIS — H353232 Exudative age-related macular degeneration, bilateral, with inactive choroidal neovascularization: Secondary | ICD-10-CM | POA: Diagnosis not present

## 2017-12-28 DIAGNOSIS — H2513 Age-related nuclear cataract, bilateral: Secondary | ICD-10-CM | POA: Diagnosis not present

## 2017-12-28 DIAGNOSIS — H2512 Age-related nuclear cataract, left eye: Secondary | ICD-10-CM | POA: Diagnosis not present

## 2018-01-31 DIAGNOSIS — H2512 Age-related nuclear cataract, left eye: Secondary | ICD-10-CM | POA: Diagnosis not present

## 2018-02-01 DIAGNOSIS — H25011 Cortical age-related cataract, right eye: Secondary | ICD-10-CM | POA: Diagnosis not present

## 2018-02-08 DIAGNOSIS — H353232 Exudative age-related macular degeneration, bilateral, with inactive choroidal neovascularization: Secondary | ICD-10-CM | POA: Diagnosis not present

## 2018-02-15 DIAGNOSIS — I10 Essential (primary) hypertension: Secondary | ICD-10-CM | POA: Diagnosis not present

## 2018-03-01 DIAGNOSIS — H353221 Exudative age-related macular degeneration, left eye, with active choroidal neovascularization: Secondary | ICD-10-CM | POA: Diagnosis not present

## 2018-03-01 DIAGNOSIS — H353112 Nonexudative age-related macular degeneration, right eye, intermediate dry stage: Secondary | ICD-10-CM | POA: Diagnosis not present

## 2018-03-01 DIAGNOSIS — H2511 Age-related nuclear cataract, right eye: Secondary | ICD-10-CM | POA: Diagnosis not present

## 2018-03-01 DIAGNOSIS — H26492 Other secondary cataract, left eye: Secondary | ICD-10-CM | POA: Diagnosis not present

## 2018-03-08 DIAGNOSIS — Z0001 Encounter for general adult medical examination with abnormal findings: Secondary | ICD-10-CM | POA: Diagnosis not present

## 2018-03-08 DIAGNOSIS — H3530 Unspecified macular degeneration: Secondary | ICD-10-CM | POA: Diagnosis not present

## 2018-03-08 DIAGNOSIS — I10 Essential (primary) hypertension: Secondary | ICD-10-CM | POA: Diagnosis not present

## 2018-03-08 DIAGNOSIS — M1990 Unspecified osteoarthritis, unspecified site: Secondary | ICD-10-CM | POA: Diagnosis not present

## 2018-03-08 DIAGNOSIS — Z6825 Body mass index (BMI) 25.0-25.9, adult: Secondary | ICD-10-CM | POA: Diagnosis not present

## 2018-07-09 DIAGNOSIS — H26492 Other secondary cataract, left eye: Secondary | ICD-10-CM | POA: Diagnosis not present

## 2018-07-09 DIAGNOSIS — H353112 Nonexudative age-related macular degeneration, right eye, intermediate dry stage: Secondary | ICD-10-CM | POA: Diagnosis not present

## 2018-07-09 DIAGNOSIS — H2511 Age-related nuclear cataract, right eye: Secondary | ICD-10-CM | POA: Diagnosis not present

## 2018-07-09 DIAGNOSIS — H353222 Exudative age-related macular degeneration, left eye, with inactive choroidal neovascularization: Secondary | ICD-10-CM | POA: Diagnosis not present

## 2018-10-17 DIAGNOSIS — D045 Carcinoma in situ of skin of trunk: Secondary | ICD-10-CM | POA: Diagnosis not present

## 2018-10-17 DIAGNOSIS — L570 Actinic keratosis: Secondary | ICD-10-CM | POA: Diagnosis not present

## 2018-10-17 DIAGNOSIS — Z1283 Encounter for screening for malignant neoplasm of skin: Secondary | ICD-10-CM | POA: Diagnosis not present

## 2018-10-17 DIAGNOSIS — X32XXXD Exposure to sunlight, subsequent encounter: Secondary | ICD-10-CM | POA: Diagnosis not present

## 2018-10-17 DIAGNOSIS — D225 Melanocytic nevi of trunk: Secondary | ICD-10-CM | POA: Diagnosis not present

## 2018-12-26 DIAGNOSIS — L728 Other follicular cysts of the skin and subcutaneous tissue: Secondary | ICD-10-CM | POA: Diagnosis not present

## 2018-12-26 DIAGNOSIS — Z85828 Personal history of other malignant neoplasm of skin: Secondary | ICD-10-CM | POA: Diagnosis not present

## 2018-12-26 DIAGNOSIS — D045 Carcinoma in situ of skin of trunk: Secondary | ICD-10-CM | POA: Diagnosis not present

## 2018-12-26 DIAGNOSIS — Z08 Encounter for follow-up examination after completed treatment for malignant neoplasm: Secondary | ICD-10-CM | POA: Diagnosis not present

## 2018-12-26 DIAGNOSIS — C44529 Squamous cell carcinoma of skin of other part of trunk: Secondary | ICD-10-CM | POA: Diagnosis not present

## 2019-01-31 DIAGNOSIS — I10 Essential (primary) hypertension: Secondary | ICD-10-CM | POA: Diagnosis not present

## 2019-01-31 DIAGNOSIS — M1990 Unspecified osteoarthritis, unspecified site: Secondary | ICD-10-CM | POA: Diagnosis not present

## 2019-01-31 DIAGNOSIS — R509 Fever, unspecified: Secondary | ICD-10-CM | POA: Diagnosis not present

## 2019-01-31 DIAGNOSIS — R05 Cough: Secondary | ICD-10-CM | POA: Diagnosis not present

## 2019-01-31 DIAGNOSIS — R6883 Chills (without fever): Secondary | ICD-10-CM | POA: Diagnosis not present

## 2019-01-31 DIAGNOSIS — H3530 Unspecified macular degeneration: Secondary | ICD-10-CM | POA: Diagnosis not present

## 2019-01-31 DIAGNOSIS — Z0001 Encounter for general adult medical examination with abnormal findings: Secondary | ICD-10-CM | POA: Diagnosis not present

## 2019-01-31 DIAGNOSIS — G4489 Other headache syndrome: Secondary | ICD-10-CM | POA: Diagnosis not present

## 2019-02-18 ENCOUNTER — Other Ambulatory Visit: Payer: Self-pay

## 2019-02-18 ENCOUNTER — Ambulatory Visit: Payer: BLUE CROSS/BLUE SHIELD | Admitting: Orthopaedic Surgery

## 2019-02-18 ENCOUNTER — Ambulatory Visit: Payer: BC Managed Care – PPO

## 2019-02-18 ENCOUNTER — Encounter: Payer: Self-pay | Admitting: Orthopaedic Surgery

## 2019-02-18 ENCOUNTER — Ambulatory Visit (INDEPENDENT_AMBULATORY_CARE_PROVIDER_SITE_OTHER): Payer: BC Managed Care – PPO | Admitting: Orthopaedic Surgery

## 2019-02-18 VITALS — BP 111/78 | HR 72 | Ht 66.0 in | Wt 157.0 lb

## 2019-02-18 DIAGNOSIS — M25551 Pain in right hip: Secondary | ICD-10-CM | POA: Diagnosis not present

## 2019-02-18 DIAGNOSIS — G8929 Other chronic pain: Secondary | ICD-10-CM

## 2019-02-18 DIAGNOSIS — F17210 Nicotine dependence, cigarettes, uncomplicated: Secondary | ICD-10-CM

## 2019-02-18 DIAGNOSIS — M25552 Pain in left hip: Secondary | ICD-10-CM

## 2019-02-18 MED ORDER — PREDNISONE 5 MG (21) PO TBPK: ORAL_TABLET | ORAL | 0 refills | Status: DC

## 2019-02-18 NOTE — Progress Notes (Signed)
Patient NP:[id number] Micheal Serrano, male DOB:[birth year], 63 y.o. [id number]  Chief Complaint  Patient presents with  . Hip Pain    left greater than right with walking and steps     HPI  Micheal Serrano is a 63 y.o. male who has slowly developed pain in both hips, more on the left.  He can only walk about a block or two before he has to stop because of the pain.  He has seen Dr. Wende Serrano and has been on Relafen which has not helped.  He has no trauma, no redness, no numbness.  Advil has not helped either.   Body mass index is 25.34 kg/m.  ROS  Review of Systems  Musculoskeletal: Positive for arthralgias and myalgias.  All other systems reviewed and are negative.   All other systems reviewed and are negative.  The following is a summary of the past history medically, past history surgically, known current medicines, social history and family history.  This information is gathered electronically by the computer from prior information and documentation.  I review this each visit and have found including this information at this point in the chart is beneficial and informative.    Past Medical History:  Diagnosis Date  . Alcohol abuse   . GERD (gastroesophageal reflux disease)   . Hepatitis C    states this has been treated and is clear  . Hypertension   . Neck pain     Past Surgical History:  Procedure Laterality Date  . CERVICAL FUSION  06/2013  . CHOLECYSTECTOMY    . COLONOSCOPY N/A 01/30/2013   Procedure: COLONOSCOPY;  Surgeon: Rogene Houston, MD;  [facility];  Service: Endoscopy;  Laterality: N/A;  100-moved to 12:00 Ann to notify pt  . HERNIA REPAIR    . KNEE ARTHROSCOPY Right   . LUMBAR LAMINECTOMY/DECOMPRESSION MICRODISCECTOMY Left 06/03/2014   Procedure: LUMBAR FIVE-SACRAL ONE LUMBAR LAMINECTOMY/DECOMPRESSION MICRODISCECTOMY 1 LEVEL;  Surgeon: Hosie Spangle, MD;  Location: [facility];  Service: Neurosurgery;  Laterality: Left;  Left L5S1 laminotomy  and microdiskectomy  . VASECTOMY      Family History  Problem Relation Age of Onset  . Breast cancer Mother   . Thyroid disease Mother   . Stroke Mother   . Dementia Father   . Colon cancer Neg Hx     Social History Social History   Tobacco Use  . Smoking status: Current Every Day Smoker    Packs/day: 1.00    Years: 25.00    Pack years: 25.00    Types: Cigarettes  . Smokeless tobacco: Never Used  . Tobacco comment: 1 pack a day x  x 30 yrs  Substance Use Topics  . Alcohol use: Yes    Alcohol/week: 14.0 standard drinks    Types: 14 Cans of beer per week    Comment: 2-3 beers a day  . Drug use: No    No Known Allergies  Current Outpatient Medications  Medication Sig Dispense Refill  . nabumetone (RELAFEN) 500 MG tablet Take 1,000 mg by mouth 2 (two) times daily.    Marland Kitchen olmesartan (BENICAR) 40 MG tablet Take 40 mg by mouth daily.    Marland Kitchen HYDROcodone-acetaminophen (NORCO/VICODIN) 5-325 MG tablet One tablet every four hours as needed for acute pain.  Limit of five days per [place] statue. (Patient not taking: Reported on 02/18/2019) 30 tablet 0  . predniSONE (STERAPRED UNI-PAK 21 TAB) 5 MG (21) TBPK tablet Take 6 pills first day;  5 pills second day; 4 pills third day; 3 pills fourth day; 2 pills next day and 1 pill last day. 21 tablet 0   No current facility-administered medications for this visit.      Physical Exam  Blood pressure 111/78, pulse 72, height 5\' 6"  (1.676 m), weight 157 lb (71.2 kg).  Constitutional: overall normal hygiene, normal nutrition, well developed, normal grooming, normal body habitus. Assistive device:none  Musculoskeletal: gait and station Limp left, muscle tone and strength are normal, no tremors or atrophy is present.  .  Neurological: coordination overall normal.  Deep tendon reflex/nerve stretch intact.  Sensation normal.  Cranial nerves II-XII intact.   Skin:   Normal overall no scars, lesions, ulcers or rashes. No psoriasis.  Psychiatric:  Alert and oriented x 3.  Recent memory intact, remote memory unclear.  Normal mood and affect. Well groomed.  Good eye contact.  Cardiovascular: overall no swelling, no varicosities, no edema bilaterally, normal temperatures of the legs and arms, no clubbing, cyanosis and good capillary refill.  Lymphatic: palpation is normal.  Both hips are tender but the left hurts more.  He has decreased motion more of the right hip with internal 15, external 15, forward 90 on the left he has internal 25, external 25, forward 90.  He has limp to the left.  NV intact.  All other systems reviewed and are negative   The patient has been educated about the nature of the problem(s) and counseled on treatment options.  The patient appeared to understand what I have discussed and is in agreement with it.  Encounter Diagnoses  Name Primary?  . Chronic hip pain, right Yes  . Chronic hip pain, left   . Nicotine dependence, cigarettes, uncomplicated    X-rays were done of both hips, reported separately.  PLAN Call if any problems.  Precautions discussed.  Stop Relafen and begin prednisone dose pack.  Return to clinic 2 [place]   Electronically Signed Sanjuana Kava, MD 9/8/20208:45 AM

## 2019-02-18 NOTE — Patient Instructions (Signed)
Steps to Quit Smoking Smoking tobacco is the leading cause of preventable death. It can affect almost every organ in the body. Smoking puts you and people around you at risk for many serious, long-lasting (chronic) diseases. Quitting smoking can be hard, but it is one of the best things that you can do for your health. It is never too late to quit. How do I get ready to quit? When you decide to quit smoking, make a plan to help you succeed. Before you quit:  Pick a date to quit. Set a date within the next 2 [place] to give you time to prepare.  Write down the reasons why you are quitting. Keep this list in places where you will see it often.  Tell your family, friends, and co-workers that you are quitting. Their support is important.  Talk with your doctor about the choices that may help you quit.  Find out if your health insurance will pay for these treatments.  Know the people, places, things, and activities that make you want to smoke (triggers). Avoid them. What first steps can I take to quit smoking?  Throw away all cigarettes at home, at work, and in your car.  Throw away the things that you use when you smoke, such as ashtrays and lighters.  Clean your car. Make sure to empty the ashtray.  Clean your home, including curtains and carpets. What can I do to help me quit smoking? Talk with your doctor about taking medicines and seeing a counselor at the same time. You are more likely to succeed when you do both.  If you are pregnant or breastfeeding, talk with your doctor about counseling or other ways to quit smoking. Do not take medicine to help you quit smoking unless your doctor tells you to do so. To quit smoking: Quit right away  Quit smoking totally, instead of slowly cutting back on how much you smoke over a period of time.  Go to counseling. You are more likely to quit if you go to counseling sessions regularly. Take medicine You may take medicines to help you quit. Some  medicines need a prescription, and some you can buy over-the-counter. Some medicines may contain a drug called nicotine to replace the nicotine in cigarettes. Medicines may:  Help you to stop having the desire to smoke (cravings).  Help to stop the problems that come when you stop smoking (withdrawal symptoms). Your doctor may ask you to use:  Nicotine patches, gum, or lozenges.  Nicotine inhalers or sprays.  Non-nicotine medicine that is taken by mouth. Find resources Find resources and other ways to help you quit smoking and remain smoke-free after you quit. These resources are most helpful when you use them often. They include:  Online chats with a counselor.  Phone quitlines.  Printed self-help materials.  Support groups or group counseling.  Text messaging programs.  Mobile phone apps. Use apps on your mobile phone or tablet that can help you stick to your quit plan. There are many free apps for mobile phones and tablets as well as websites. Examples include Quit Guide from the CDC and smokefree.gov  What things can I do to make it easier to quit?   Talk to your family and friends. Ask them to support and encourage you.  Call a phone quitline (1-800-QUIT-NOW), reach out to support groups, or work with a counselor.  Ask people who smoke to not smoke around you.  Avoid places that make you want to smoke,   such as: ? Bars. ? Parties. ? Smoke-break areas at work.  Spend time with people who do not smoke.  Lower the stress in your life. Stress can make you want to smoke. Try these things to help your stress: ? Getting regular exercise. ? Doing deep-breathing exercises. ? Doing yoga. ? Meditating. ? Doing a body scan. To do this, close your eyes, focus on one area of your body at a time from head to toe. Notice which parts of your body are tense. Try to relax the muscles in those areas. How will I feel when I quit smoking? Day 1 to 3 [place] Within the first 24 hours,  you may start to have some problems that come from quitting tobacco. These problems are very bad 2-3 days after you quit, but they do not often last for more than 2-3 [place]. You may get these symptoms:  Mood swings.  Feeling restless, nervous, angry, or annoyed.  Trouble concentrating.  Dizziness.  Strong desire for high-sugar foods and nicotine.  Weight gain.  Trouble pooping (constipation).  Feeling like you may vomit (nausea).  Coughing or a sore throat.  Changes in how the medicines that you take for other issues work in your body.  Depression.  Trouble sleeping (insomnia). Week 3 and afterward After the first 2-3 [place] of quitting, you may start to notice more positive results, such as:  Better sense of smell and taste.  Less coughing and sore throat.  Slower heart rate.  Lower blood pressure.  Clearer skin.  Better breathing.  Fewer sick days. Quitting smoking can be hard. Do not give up if you fail the first time. Some people need to try a few times before they succeed. Do your best to stick to your quit plan, and talk with your doctor if you have any questions or concerns. Summary  Smoking tobacco is the leading cause of preventable death. Quitting smoking can be hard, but it is one of the best things that you can do for your health.  When you decide to quit smoking, make a plan to help you succeed.  Quit smoking right away, not slowly over a period of time.  When you start quitting, seek help from your doctor, family, or friends. This information is not intended to replace advice given to you by your health care provider. Make sure you discuss any questions you have with your health care provider. Document Released: 03/25/2009 Document Revised: 08/16/2018 Document Reviewed: 08/17/2018 Elsevier Patient Education  2020 Elsevier Inc.  

## 2019-02-21 DIAGNOSIS — I10 Essential (primary) hypertension: Secondary | ICD-10-CM | POA: Diagnosis not present

## 2019-02-21 DIAGNOSIS — Z0000 Encounter for general adult medical examination without abnormal findings: Secondary | ICD-10-CM | POA: Diagnosis not present

## 2019-02-25 ENCOUNTER — Ambulatory Visit: Payer: BLUE CROSS/BLUE SHIELD | Admitting: Orthopaedic Surgery

## 2019-02-28 DIAGNOSIS — H3530 Unspecified macular degeneration: Secondary | ICD-10-CM | POA: Diagnosis not present

## 2019-02-28 DIAGNOSIS — Z0001 Encounter for general adult medical examination with abnormal findings: Secondary | ICD-10-CM | POA: Diagnosis not present

## 2019-02-28 DIAGNOSIS — M160 Bilateral primary osteoarthritis of hip: Secondary | ICD-10-CM | POA: Diagnosis not present

## 2019-02-28 DIAGNOSIS — I10 Essential (primary) hypertension: Secondary | ICD-10-CM | POA: Diagnosis not present

## 2019-03-04 ENCOUNTER — Ambulatory Visit: Payer: BC Managed Care – PPO | Admitting: Orthopaedic Surgery

## 2019-03-06 ENCOUNTER — Ambulatory Visit: Payer: BC Managed Care – PPO | Admitting: Orthopaedic Surgery

## 2019-03-20 ENCOUNTER — Other Ambulatory Visit: Payer: Self-pay

## 2019-03-20 ENCOUNTER — Ambulatory Visit (INDEPENDENT_AMBULATORY_CARE_PROVIDER_SITE_OTHER): Payer: BC Managed Care – PPO | Admitting: Orthopaedic Surgery

## 2019-03-20 ENCOUNTER — Encounter: Payer: Self-pay | Admitting: Orthopaedic Surgery

## 2019-03-20 VITALS — BP 133/80 | HR 71 | Temp 97.2°F | Ht 66.0 in | Wt 155.6 lb

## 2019-03-20 DIAGNOSIS — G8929 Other chronic pain: Secondary | ICD-10-CM

## 2019-03-20 DIAGNOSIS — M25552 Pain in left hip: Secondary | ICD-10-CM | POA: Diagnosis not present

## 2019-03-20 DIAGNOSIS — M25551 Pain in right hip: Secondary | ICD-10-CM

## 2019-03-20 MED ORDER — HYDROCODONE-ACETAMINOPHEN 5-325 MG PO TABS: 1.0000 | ORAL_TABLET | ORAL | 0 refills | Status: AC | PRN

## 2019-03-20 NOTE — Progress Notes (Signed)
Patient NP:[id number] Micheal Serrano, male DOB:06-Aug-1955, 63 y.o. [id number]  Chief Complaint  Patient presents with  . Hip Pain    increase with walking     HPI  Micheal Serrano is a 63 y.o. male who continues to have pain in his hips, more on the left.  He said the prednisone did not help.  I am concerned about early avascular necrosis.  I will get MRIs.  I will call in pain medicine.  He has pain after walking about 100 feet.   Body mass index is 25.11 kg/m.  ROS  Review of Systems  Musculoskeletal: Positive for arthralgias and myalgias.  All other systems reviewed and are negative.   All other systems reviewed and are negative.  The following is a summary of the past history medically, past history surgically, known current medicines, social history and family history.  This information is gathered electronically by the computer from prior information and documentation.  I review this each visit and have found including this information at this point in the chart is beneficial and informative.    Past Medical History:  Diagnosis Date  . Alcohol abuse   . GERD (gastroesophageal reflux disease)   . Hepatitis C    states this has been treated and is clear  . Hypertension   . Neck pain     Past Surgical History:  Procedure Laterality Date  . CERVICAL FUSION  06/2013  . CHOLECYSTECTOMY    . COLONOSCOPY N/A 01/30/2013   Procedure: COLONOSCOPY;  Surgeon: Rogene Houston, MD;  [facility]: [facility];  Service: Endoscopy;  Laterality: N/A;  100-moved to 12:00 Ann to notify pt  . HERNIA REPAIR    . KNEE ARTHROSCOPY Right   . LUMBAR LAMINECTOMY/DECOMPRESSION MICRODISCECTOMY Left 06/03/2014   Procedure: LUMBAR FIVE-SACRAL ONE LUMBAR LAMINECTOMY/DECOMPRESSION MICRODISCECTOMY 1 LEVEL;  Surgeon: Hosie Spangle, MD;  Location: [facility];  Service: Neurosurgery;  Laterality: Left;  Left L5S1 laminotomy and microdiskectomy  . VASECTOMY      Family History  Problem Relation Age of  Onset  . Breast cancer Mother   . Thyroid disease Mother   . Stroke Mother   . Dementia Father   . Colon cancer Neg Hx     Social History Social History   Tobacco Use  . Smoking status: Current Every Day Smoker    Packs/day: 1.00    Years: 25.00    Pack years: 25.00    Types: Cigarettes  . Smokeless tobacco: Never Used  . Tobacco comment: 1 pack a day x  x 30 yrs  Substance Use Topics  . Alcohol use: Yes    Alcohol/week: 14.0 standard drinks    Types: 14 Cans of beer per week    Comment: 2-3 beers a day  . Drug use: No    No Known Allergies  Current Outpatient Medications  Medication Sig Dispense Refill  . nabumetone (RELAFEN) 500 MG tablet Take 1,000 mg by mouth 2 (two) times daily.    Marland Kitchen olmesartan (BENICAR) 40 MG tablet Take 40 mg by mouth daily.    Marland Kitchen HYDROcodone-acetaminophen (NORCO/VICODIN) 5-325 MG tablet Take 1 tablet by mouth every 4 (four) hours as needed for up to 5 days for moderate pain. 30 tablet 0   No current facility-administered medications for this visit.      Physical Exam  Blood pressure 133/80, pulse 71, temperature (!) 97.2 F (36.2 C), height 5\' 6"  (1.676 m), weight 155 lb 9.6 oz (70.6 kg).  Constitutional: overall normal hygiene, normal nutrition, well developed, normal grooming, normal body habitus. Assistive device:none  Musculoskeletal: gait and station Limp left, muscle tone and strength are normal, no tremors or atrophy is present.  .  Neurological: coordination overall normal.  Deep tendon reflex/nerve stretch intact.  Sensation normal.  Cranial nerves II-XII intact.   Skin:   Normal overall no scars, lesions, ulcers or rashes. No psoriasis.  Psychiatric: Alert and oriented x 3.  Recent memory intact, remote memory unclear.  Normal mood and affect. Well groomed.  Good eye contact.  Cardiovascular: overall no swelling, no varicosities, no edema bilaterally, normal temperatures of the legs and arms, no clubbing, cyanosis and good  capillary refill.  Both hips have decreased internal and external rotation.  Gait is OK this early morning.  Lymphatic: palpation is normal.  All other systems reviewed and are negative   The patient has been educated about the nature of the problem(s) and counseled on treatment options.  The patient appeared to understand what I have discussed and is in agreement with it.  Encounter Diagnoses  Name Primary?  . Chronic hip pain, right Yes  . Chronic hip pain, left    I am concerned about possible avascular necrosis. PLAN Call if any problems.  Precautions discussed.  Continue current medications.   Return to clinic 2 [place]   MRI of the hips.  Electronically Signed Sanjuana Kava, MD 10/8/20208:28 AM

## 2019-03-20 NOTE — Addendum Note (Signed)
Addended by: Derek Mound A on: 03/20/2019 09:53 AM   Modules accepted: Orders

## 2019-03-20 NOTE — Addendum Note (Signed)
Addended by: Derek Mound A on: 03/20/2019 09:13 AM   Modules accepted: Orders

## 2019-03-24 DIAGNOSIS — Z20828 Contact with and (suspected) exposure to other viral communicable diseases: Secondary | ICD-10-CM | POA: Diagnosis not present

## 2019-03-24 DIAGNOSIS — Z0001 Encounter for general adult medical examination with abnormal findings: Secondary | ICD-10-CM | POA: Diagnosis not present

## 2019-03-24 DIAGNOSIS — G4489 Other headache syndrome: Secondary | ICD-10-CM | POA: Diagnosis not present

## 2019-03-24 DIAGNOSIS — R5383 Other fatigue: Secondary | ICD-10-CM | POA: Diagnosis not present

## 2019-03-24 DIAGNOSIS — R6883 Chills (without fever): Secondary | ICD-10-CM | POA: Diagnosis not present

## 2019-04-04 ENCOUNTER — Ambulatory Visit (HOSPITAL_COMMUNITY): Payer: BC Managed Care – PPO | Attending: Orthopaedic Surgery

## 2019-04-04 ENCOUNTER — Ambulatory Visit (HOSPITAL_COMMUNITY): Admission: RE | Admit: 2019-04-04 | Payer: BC Managed Care – PPO | Source: Ambulatory Visit

## 2019-04-08 ENCOUNTER — Ambulatory Visit: Payer: BC Managed Care – PPO | Admitting: Orthopaedic Surgery

## 2019-04-18 ENCOUNTER — Ambulatory Visit (HOSPITAL_COMMUNITY)
Admission: RE | Admit: 2019-04-18 | Discharge: 2019-04-18 | Disposition: A | Discharge status: Home / Self Care | Payer: BC Managed Care – PPO | Source: Ambulatory Visit | Attending: Orthopaedic Surgery | Admitting: Orthopaedic Surgery

## 2019-04-18 ENCOUNTER — Other Ambulatory Visit: Payer: Self-pay

## 2019-04-18 DIAGNOSIS — M1612 Unilateral primary osteoarthritis, left hip: Secondary | ICD-10-CM | POA: Diagnosis not present

## 2019-04-18 DIAGNOSIS — M1611 Unilateral primary osteoarthritis, right hip: Secondary | ICD-10-CM | POA: Diagnosis not present

## 2019-04-18 DIAGNOSIS — M25552 Pain in left hip: Secondary | ICD-10-CM | POA: Diagnosis not present

## 2019-04-18 DIAGNOSIS — M25551 Pain in right hip: Secondary | ICD-10-CM | POA: Insufficient documentation

## 2019-04-18 DIAGNOSIS — G8929 Other chronic pain: Secondary | ICD-10-CM | POA: Diagnosis not present

## 2019-04-22 ENCOUNTER — Other Ambulatory Visit: Payer: Self-pay

## 2019-04-22 ENCOUNTER — Ambulatory Visit (INDEPENDENT_AMBULATORY_CARE_PROVIDER_SITE_OTHER): Payer: BC Managed Care – PPO | Admitting: Orthopaedic Surgery

## 2019-04-22 ENCOUNTER — Ambulatory Visit: Payer: BC Managed Care – PPO

## 2019-04-22 ENCOUNTER — Encounter: Payer: Self-pay | Admitting: Orthopaedic Surgery

## 2019-04-22 VITALS — BP 122/64 | HR 68 | Temp 97.4°F | Ht 66.0 in | Wt 153.0 lb

## 2019-04-22 DIAGNOSIS — M79605 Pain in left leg: Secondary | ICD-10-CM | POA: Diagnosis not present

## 2019-04-22 DIAGNOSIS — M5441 Lumbago with sciatica, right side: Secondary | ICD-10-CM | POA: Diagnosis not present

## 2019-04-22 DIAGNOSIS — G8929 Other chronic pain: Secondary | ICD-10-CM

## 2019-04-22 DIAGNOSIS — M79604 Pain in right leg: Secondary | ICD-10-CM | POA: Diagnosis not present

## 2019-04-22 MED ORDER — HYDROCODONE-ACETAMINOPHEN 5-325 MG PO TABS: ORAL_TABLET | ORAL | 0 refills | Status: DC

## 2019-04-22 NOTE — Patient Instructions (Signed)
[facility] will call you with appointment at [facility] office. If you do not hear anything in one week let us know. [phone] ask for 

## 2019-04-22 NOTE — Progress Notes (Signed)
Patient Micheal Serrano, male DOB:02/07/1956, 63 y.o. TU:[id number]  Chief Complaint  Patient presents with  . Hip Pain    Chronic hip pain    HPI  Micheal Serrano is a 63 y.o. male who has hip pain on the right after walking about 100 yards.  He says the pain is intense at times but he has pain also of the left hip some as well.  If he does not walk that much at a time, he has no pain, no problem.  He had MRI of the hips which showed: IMPRESSION: 1. No right hip fracture, dislocation or avascular necrosis. 2. No left hip fracture, dislocation or avascular necrosis. 3. Mild osteoarthritis of the right hip. 4. Minimal early osteoarthritis of the left hip. 5. Moderate tendinosis of the right gluteus minimus tendon insertion. 6. Severe degenerative disease with disc height loss at L5-S1 with reactive endplate changes.  I have explained the findings to him.  He had no response to prednisone dose pack.  He continues to have this problem.  There is no sign of early avascular necrosis. He had back surgery in 2017 and did well from that.  He has had epidurals in the past before his surgery.  I am concerned that he had no response to the prednisone giving less credence to a nerve or bone problem.  He does have calcification of the aorta distally.    I had him walk about 100 yards up and down my long hallway in the office today.  I tested his circulation before and after, grossly.  He had fair pulses on the right foot before but less after walking the distance and his right foot had more of a white pale color that returned to a nice pink after about a minute.  The left foot had a slight white paleness to it but much less and had pulses like before.  Thus, I am concerned about possible vascular problem and possible claudication.  I would like to have him see a vascular surgeon for further evaluation. I have explained this to him.  It might not be the problem but should be investigated.  He is  agreeable.     Body mass index is 24.69 kg/m.  ROS  Review of Systems  Musculoskeletal: Positive for arthralgias and myalgias.  All other systems reviewed and are negative.   All other systems reviewed and are negative.  The following is a summary of the past history medically, past history surgically, known current medicines, social history and family history.  This information is gathered electronically by the computer from prior information and documentation.  I review this each visit and have found including this information at this point in the chart is beneficial and informative.    Past Medical History:  Diagnosis Date  . Alcohol abuse   . GERD (gastroesophageal reflux disease)   . Hepatitis C    states this has been treated and is clear  . Hypertension   . Neck pain     Past Surgical History:  Procedure Laterality Date  . CERVICAL FUSION  06/2013  . CHOLECYSTECTOMY    . COLONOSCOPY N/A 01/30/2013   Procedure: COLONOSCOPY;  Surgeon: Rogene Houston, MD;  [facility];  Service: Endoscopy;  Laterality: N/A;  100-moved to 12:00 Ann to notify pt  . HERNIA REPAIR    . KNEE ARTHROSCOPY Right   . LUMBAR LAMINECTOMY/DECOMPRESSION MICRODISCECTOMY Left 06/03/2014   Procedure: LUMBAR FIVE-SACRAL ONE LUMBAR LAMINECTOMY/DECOMPRESSION MICRODISCECTOMY  1 LEVEL;  Surgeon: Hosie Spangle, MD;  Location: [facility];  Service: Neurosurgery;  Laterality: Left;  Left L5S1 laminotomy and microdiskectomy  . VASECTOMY      Family History  Problem Relation Age of Onset  . Breast cancer Mother   . Thyroid disease Mother   . Stroke Mother   . Dementia Father   . Colon cancer Neg Hx     Social History Social History   Tobacco Use  . Smoking status: Current Every Day Smoker    Packs/day: 1.00    Years: 25.00    Pack years: 25.00    Types: Cigarettes  . Smokeless tobacco: Never Used  . Tobacco comment: 1 pack a day x  x 30 yrs  Substance Use Topics  . Alcohol  use: Yes    Alcohol/week: 14.0 standard drinks    Types: 14 Cans of beer per week    Comment: 2-3 beers a day  . Drug use: No    No Known Allergies  Current Outpatient Medications  Medication Sig Dispense Refill  . HYDROcodone-acetaminophen (NORCO/VICODIN) 5-325 MG tablet One tablet every four hours as needed for acute pain.  Limit of five days per [place] statue. 30 tablet 0  . nabumetone (RELAFEN) 500 MG tablet Take 1,000 mg by mouth 2 (two) times daily.    Marland Kitchen olmesartan (BENICAR) 40 MG tablet Take 40 mg by mouth daily.     No current facility-administered medications for this visit.      Physical Exam  Blood pressure 122/64, pulse 68, temperature (!) 97.4 F (36.3 C), height 5\' 6"  (1.676 m), weight 153 lb (69.4 kg).  Constitutional: overall normal hygiene, normal nutrition, well developed, normal grooming, normal body habitus. Assistive device:none  Musculoskeletal: gait and station Limp none, muscle tone and strength are normal, no tremors or atrophy is present.  .  Neurological: coordination overall normal.  Deep tendon reflex/nerve stretch intact.  Sensation normal.  Cranial nerves II-XII intact.   Skin:   Normal overall no scars, lesions, ulcers or rashes. No psoriasis.  Psychiatric: Alert and oriented x 3.  Recent memory intact, remote memory unclear.  Normal mood and affect. Well groomed.  Good eye contact.  Cardiovascular: overall no swelling, no varicosities, no edema bilaterally, normal temperatures of the legs and arms, no clubbing, cyanosis and good capillary refill.  Lymphatic: palpation is normal.  Hips have good ROM.  NV intact.  Lower back with no spasm and good ROM.  See above about testing circulation and appearance of feet before and right after walking 100 yards.  All other systems reviewed and are negative   The patient has been educated about the nature of the problem(s) and counseled on treatment options.  The patient appeared to understand what I  have discussed and is in agreement with it.  Encounter Diagnoses  Name Primary?  . Chronic right-sided low back pain with right-sided sciatica Yes  . Pain in right leg   . Pain in left leg    X-rays were done of the lumbar spine, reported separately.  It does show L5-S1 degenerative changes, but no big change from past.  PLAN Call if any problems.  Precautions discussed.  Continue current medications.   Return to clinic to see vascular surgeon for further evaluation.   Electronically Signed Sanjuana Kava, MD 11/10/202011:32 AM

## 2019-06-05 ENCOUNTER — Other Ambulatory Visit: Payer: Self-pay

## 2019-06-05 DIAGNOSIS — I739 Peripheral vascular disease, unspecified: Secondary | ICD-10-CM

## 2019-06-12 ENCOUNTER — Encounter (HOSPITAL_COMMUNITY): Payer: BC Managed Care – PPO

## 2019-06-12 ENCOUNTER — Encounter: Payer: BC Managed Care – PPO | Admitting: Vascular Surgery

## 2019-07-04 DIAGNOSIS — Z981 Arthrodesis status: Secondary | ICD-10-CM | POA: Diagnosis not present

## 2019-07-04 DIAGNOSIS — M542 Cervicalgia: Secondary | ICD-10-CM | POA: Diagnosis not present

## 2019-07-04 DIAGNOSIS — M47812 Spondylosis without myelopathy or radiculopathy, cervical region: Secondary | ICD-10-CM | POA: Diagnosis not present

## 2019-07-04 DIAGNOSIS — Z9889 Other specified postprocedural states: Secondary | ICD-10-CM | POA: Insufficient documentation

## 2019-07-04 DIAGNOSIS — M79641 Pain in right hand: Secondary | ICD-10-CM | POA: Diagnosis not present

## 2019-07-30 DIAGNOSIS — R6883 Chills (without fever): Secondary | ICD-10-CM | POA: Diagnosis not present

## 2019-07-30 DIAGNOSIS — M1990 Unspecified osteoarthritis, unspecified site: Secondary | ICD-10-CM | POA: Diagnosis not present

## 2019-07-30 DIAGNOSIS — Z0001 Encounter for general adult medical examination with abnormal findings: Secondary | ICD-10-CM | POA: Diagnosis not present

## 2019-07-30 DIAGNOSIS — G4489 Other headache syndrome: Secondary | ICD-10-CM | POA: Diagnosis not present

## 2019-07-30 DIAGNOSIS — M25541 Pain in joints of right hand: Secondary | ICD-10-CM | POA: Diagnosis not present

## 2019-07-30 DIAGNOSIS — R5383 Other fatigue: Secondary | ICD-10-CM | POA: Diagnosis not present

## 2019-08-20 DIAGNOSIS — M25442 Effusion, left hand: Secondary | ICD-10-CM | POA: Diagnosis not present

## 2019-08-20 DIAGNOSIS — Z111 Encounter for screening for respiratory tuberculosis: Secondary | ICD-10-CM | POA: Diagnosis not present

## 2019-08-20 DIAGNOSIS — R768 Other specified abnormal immunological findings in serum: Secondary | ICD-10-CM | POA: Diagnosis not present

## 2019-08-20 DIAGNOSIS — M25441 Effusion, right hand: Secondary | ICD-10-CM | POA: Diagnosis not present

## 2019-09-05 DIAGNOSIS — M0579 Rheumatoid arthritis with rheumatoid factor of multiple sites without organ or systems involvement: Secondary | ICD-10-CM | POA: Diagnosis not present

## 2019-09-05 DIAGNOSIS — M25441 Effusion, right hand: Secondary | ICD-10-CM | POA: Diagnosis not present

## 2019-09-05 DIAGNOSIS — R768 Other specified abnormal immunological findings in serum: Secondary | ICD-10-CM | POA: Diagnosis not present

## 2019-11-06 DIAGNOSIS — H2511 Age-related nuclear cataract, right eye: Secondary | ICD-10-CM | POA: Diagnosis not present

## 2019-11-06 DIAGNOSIS — Z961 Presence of intraocular lens: Secondary | ICD-10-CM | POA: Diagnosis not present

## 2019-11-06 DIAGNOSIS — H353223 Exudative age-related macular degeneration, left eye, with inactive scar: Secondary | ICD-10-CM | POA: Diagnosis not present

## 2019-11-06 DIAGNOSIS — H26492 Other secondary cataract, left eye: Secondary | ICD-10-CM | POA: Diagnosis not present

## 2019-11-06 DIAGNOSIS — H353112 Nonexudative age-related macular degeneration, right eye, intermediate dry stage: Secondary | ICD-10-CM | POA: Diagnosis not present

## 2019-11-18 DIAGNOSIS — M25441 Effusion, right hand: Secondary | ICD-10-CM | POA: Diagnosis not present

## 2019-11-18 DIAGNOSIS — R768 Other specified abnormal immunological findings in serum: Secondary | ICD-10-CM | POA: Diagnosis not present

## 2019-11-18 DIAGNOSIS — M0579 Rheumatoid arthritis with rheumatoid factor of multiple sites without organ or systems involvement: Secondary | ICD-10-CM | POA: Diagnosis not present

## 2019-12-19 NOTE — H&P (Signed)
Surgical History & Physical  Patient Name: Micheal Serrano DOB: Oct 09, 1955  Surgery: Cataract extraction with intraocular lens implant phacoemulsification; Right Eye  Surgeon: Baruch Goldmann MD Surgery Date:  12/26/2019 Pre-Op Date:  12/18/2019  HPI: A 34 Yr. old male patient 1. 1. The patient complains of difficulty when viewing TV, reading closed caption, news scrolls on TV, which began many years ago. The right eye is affected. The condition's severity is worsening. Symptoms occur when the patient is driving. The condition is worse with daily activities. The complaint is associated with blurry vision. Decline in vision felt in particular the last two months. At night when driving has difficulty with glare and halos around street lights, avoids night driving if possible. This is negatively affecting the patient's quality of life. CE IOL OS, states that after surgery OS vision decreased greatly, was being treated also for wet mac degen OS but has since d/c Eylea injections prior to pandemic. HPI Completed by Dr. Baruch Goldmann  Medical History: Macula Degeneration Cataracts CE IOL OS High Blood Pressure  Review of Systems Cardiovascular High Blood Pressure All recorded systems are negative except as noted above.  Social   Never smoked   Medication Benicar,   Sx/Procedures CE/IOL Eylea injections,  Neck surgery, Back Surgery, Knee Surgery,   Drug Allergies   NKDA  History & Physical: Heent:  Cataract, Right eye NECK: supple without bruits LUNGS: lungs clear to auscultation CV: regular rate and rhythm Abdomen: soft and non-tender  Impression & Plan: Assessment: 1.  NUCLEAR SCLEROSIS AGE RELATED; Right Eye (H25.11) 2.  PCO; Left Eye (H26.492) 3.  INTRAOCULAR LENS IOL (Z96.1) 4.  AGE-RELATED MACULAR DEGENERATION DRY; Right Eye Intermediate (O29.4765) 5.  ARMD WET; Left Eye Inactive scar (Y65.0354)  Plan: 1.  Cataract accounts for the patient's decreased vision. This  visual impairment is not correctable with a tolerable change in glasses or contact lenses. Cataract surgery with an implantation of a new lens should significantly improve the visual and functional status of the patient. Discussed all risks, benefits, alternatives, and potential complications. Discussed the procedures and recovery. Patient desires to have surgery. A-scan ordered and performed today for intra-ocular lens calculations. The surgery will be performed in order to improve vision for driving, reading, and for eye examinations. Recommend phacoemulsification with intra-ocular lens. Right Eye. Surgery required to correct imbalance of vision. Dilates well - shugarcaine by protocol. DIB00 Lens. 2.  Symptomatic and visually significant. Accounts for patient's symptoms and is negatively affecting the patient's quality of life. Reviewed risks of the Yag laser including IOP spike, retinal detachment, and chronic inflammation. Will address after Phaco PCIOL to hopefully give some visual improvement. 3.  Sulcus IOL OS. as above. 4.  Under the care of Dr. Baird Cancer 5.  Under the care of Dr. Baird Cancer

## 2019-12-19 NOTE — Patient Instructions (Signed)
TALIB HEADLEY  12/19/2019     @PREFPERIOPPHARMACY @   Your procedure is scheduled on  12/26/2019   Report to [facility] at  [phone]  A.M.  Call this number if you have problems the morning of surgery:  [phone]   Remember:  Do not eat or drink after midnight.                      Take these medicines the morning of surgery with A SIP OF WATER Hydrocodone(if needed).    Do not wear jewelry, make-up or nail polish.  Do not wear lotions, powders, or perfumes. Please wear deodorant and brush your teeth.  Do not shave 48 hours prior to surgery.  Men may shave face and neck.  Do not bring valuables to the hospital.  [facility] is not responsible for any belongings or valuables.  Contacts, dentures or bridgework may not be worn into surgery.  Leave your suitcase in the car.  After surgery it may be brought to your room.  For patients admitted to the hospital, discharge time will be determined by your treatment team.  Patients discharged the day of surgery will not be allowed to drive home.   Name and phone number of your driver:   family Special instructions:  DO NOT smoke the morning of your procedure.  Please read over the following fact sheets that you were given. Anesthesia Post-op Instructions and Care and Recovery After Surgery       Cataract Surgery, Care After This sheet gives you information about how to care for yourself after your procedure. Your health care provider may also give you more specific instructions. If you have problems or questions, contact your health care provider. What can I expect after the procedure? After the procedure, it is common to have:  Itching.  Discomfort.  Fluid discharge.  Sensitivity to light and to touch.  Bruising in or around the eye.  Mild blurred vision. Follow these instructions at home: Eye care   Do not touch or rub your eyes.  Protect your eyes as told by your health care provider. You may be told to  wear a protective eye shield or sunglasses.  Do not put a contact lens into the affected eye or eyes until your health care provider approves.  Keep the area around your eye clean and dry: ? Avoid swimming. ? Do not allow water to hit you directly in the face while showering. ? Keep soap and shampoo out of your eyes.  Check your eye every day for signs of infection. Watch for: ? Redness, swelling, or pain. ? Fluid, blood, or pus. ? Warmth. ? A bad smell. ? Vision that is getting worse. ? Sensitivity that is getting worse. Activity  Do not drive for 24 hours if you were given a sedative during your procedure.  Avoid strenuous activities, such as playing contact sports, for as long as told by your health care provider.  Do not drive or use heavy machinery until your health care provider approves.  Do not bend or lift heavy objects. Bending increases pressure in the eye. You can walk, climb stairs, and do light household chores.  Ask your health care provider when you can return to work. If you work in a dusty environment, you may be advised to wear protective eyewear for a period of time. General instructions  Take or apply over-the-counter and prescription medicines only as told by  your health care provider. This includes eye drops.  Keep all follow-up visits as told by your health care provider. This is important. Contact a health care provider if:  You have increased bruising around your eye.  You have pain that is not helped with medicine.  You have a fever.  You have redness, swelling, or pain in your eye.  You have fluid, blood, or pus coming from your incision.  Your vision gets worse.  Your sensitivity to light gets worse. Get help right away if:  You have sudden loss of vision.  You see flashes of light or spots (floaters).  You have severe eye pain.  You develop nausea or vomiting. Summary  After your procedure, it is common to have itching,  discomfort, bruising, fluid discharge, or sensitivity to light.  Follow instructions from your health care provider about caring for your eye after the procedure.  Do not rub your eye after the procedure. You may need to wear eye protection or sunglasses. Do not wear contact lenses. Keep the area around your eye clean and dry.  Avoid activities that require a lot of effort. These include playing sports and lifting heavy objects.  Contact a health care provider if you have increased bruising, pain that does not go away, or a fever. Get help right away if you suddenly lose your vision, see flashes of light or spots, or have severe pain in the eye. This information is not intended to replace advice given to you by your health care provider. Make sure you discuss any questions you have with your health care provider. Document Revised: 03/25/2019 Document Reviewed: 11/26/2017 Elsevier Patient Education  2020 [place] After These instructions provide you with information about caring for yourself after your procedure. Your health care provider may also give you more specific instructions. Your treatment has been planned according to current medical practices, but problems sometimes occur. Call your health care provider if you have any problems or questions after your procedure. What can I expect after the procedure? After your procedure, you may:  Feel sleepy for several hours.  Feel clumsy and have poor balance for several hours.  Feel forgetful about what happened after the procedure.  Have poor judgment for several hours.  Feel nauseous or vomit.  Have a sore throat if you had a breathing tube during the procedure. Follow these instructions at home: For at least 24 hours after the procedure:      Have a responsible adult stay with you. It is important to have someone help care for you until you are awake and alert.  Rest as needed.  Do  not: ? Participate in activities in which you could fall or become injured. ? Drive. ? Use heavy machinery. ? Drink alcohol. ? Take sleeping pills or medicines that cause drowsiness. ? Make important decisions or sign legal documents. ? Take care of children on your own. Eating and drinking  Follow the diet that is recommended by your health care provider.  If you vomit, drink water, juice, or soup when you can drink without vomiting.  Make sure you have little or no nausea before eating solid foods. General instructions  Take over-the-counter and prescription medicines only as told by your health care provider.  If you have sleep apnea, surgery and certain medicines can increase your risk for breathing problems. Follow instructions from your health care provider about wearing your sleep device: ? Anytime you are sleeping, including during daytime  naps. ? While taking prescription pain medicines, sleeping medicines, or medicines that make you drowsy.  If you smoke, do not smoke without supervision.  Keep all follow-up visits as told by your health care provider. This is important. Contact a health care provider if:  You keep feeling nauseous or you keep vomiting.  You feel light-headed.  You develop a rash.  You have a fever. Get help right away if:  You have trouble breathing. Summary  For several hours after your procedure, you may feel sleepy and have poor judgment.  Have a responsible adult stay with you for at least 24 hours or until you are awake and alert. This information is not intended to replace advice given to you by your health care provider. Make sure you discuss any questions you have with your health care provider. Document Revised: 08/27/2017 Document Reviewed: 09/19/2015 Elsevier Patient Education  [place].

## 2019-12-22 DIAGNOSIS — H2511 Age-related nuclear cataract, right eye: Secondary | ICD-10-CM | POA: Diagnosis not present

## 2019-12-24 ENCOUNTER — Encounter (HOSPITAL_COMMUNITY)
Admission: RE | Admit: 2019-12-24 | Discharge: 2019-12-24 | Disposition: A | Discharge status: Home / Self Care | Payer: BC Managed Care – PPO | Source: Ambulatory Visit | Attending: Ophthalmology | Admitting: Ophthalmology

## 2019-12-24 ENCOUNTER — Other Ambulatory Visit: Payer: Self-pay

## 2019-12-24 ENCOUNTER — Other Ambulatory Visit (HOSPITAL_COMMUNITY): Admission: RE | Admit: 2019-12-24 | Payer: BC Managed Care – PPO | Source: Ambulatory Visit

## 2019-12-24 DIAGNOSIS — Z01812 Encounter for preprocedural laboratory examination: Secondary | ICD-10-CM | POA: Insufficient documentation

## 2019-12-24 LAB — COMPREHENSIVE METABOLIC PANEL WITH GFR
ALT: 89 U/L — ABNORMAL HIGH (ref 0–44)
AST: 58 U/L — ABNORMAL HIGH (ref 15–41)
Albumin: 4.1 g/dL (ref 3.5–5.0)
Alkaline Phosphatase: 55 U/L (ref 38–126)
Anion gap: 8 (ref 5–15)
BUN: 9 mg/dL (ref 8–23)
CO2: 24 mmol/L (ref 22–32)
Calcium: 9.2 mg/dL (ref 8.9–10.3)
Chloride: 107 mmol/L (ref 98–111)
Creatinine, Ser: 0.82 mg/dL (ref 0.61–1.24)
GFR calc Af Amer: >60 mL/min (ref >60)
GFR calc non Af Amer: >60 mL/min (ref >60)
Glucose, Bld: 87 mg/dL (ref 70–99)
Potassium: 3.9 mmol/L (ref 3.5–5.1)
Sodium: 139 mmol/L (ref 135–145)
Total Bilirubin: 0.8 mg/dL (ref 0.3–1.2)
Total Protein: 7.4 g/dL (ref 6.5–8.1)

## 2019-12-24 NOTE — Progress Notes (Signed)
Patient is having Cataract Surgery ,so Covid testing is no longer required. I cancelled pt.'s Covid screening appointment. Nothing further needed.

## 2019-12-26 ENCOUNTER — Ambulatory Visit (HOSPITAL_COMMUNITY): Payer: BC Managed Care – PPO | Admitting: Anesthesiology

## 2019-12-26 ENCOUNTER — Ambulatory Visit (HOSPITAL_COMMUNITY)
Admission: RE | Admit: 2019-12-26 | Discharge: 2019-12-26 | Disposition: A | Discharge status: Home / Self Care | Payer: BC Managed Care – PPO | Attending: Ophthalmology | Admitting: Ophthalmology

## 2019-12-26 ENCOUNTER — Encounter (HOSPITAL_COMMUNITY): Payer: Self-pay | Admitting: Ophthalmology

## 2019-12-26 ENCOUNTER — Encounter (HOSPITAL_COMMUNITY)
Admission: RE | Disposition: A | Discharge status: Home / Self Care | Payer: Self-pay | Source: Home / Self Care | Attending: Ophthalmology

## 2019-12-26 ENCOUNTER — Other Ambulatory Visit: Payer: Self-pay

## 2019-12-26 DIAGNOSIS — H353110 Nonexudative age-related macular degeneration, right eye, stage unspecified: Secondary | ICD-10-CM | POA: Insufficient documentation

## 2019-12-26 DIAGNOSIS — I10 Essential (primary) hypertension: Secondary | ICD-10-CM | POA: Diagnosis not present

## 2019-12-26 DIAGNOSIS — Z79899 Other long term (current) drug therapy: Secondary | ICD-10-CM | POA: Diagnosis not present

## 2019-12-26 DIAGNOSIS — H2511 Age-related nuclear cataract, right eye: Secondary | ICD-10-CM | POA: Insufficient documentation

## 2019-12-26 HISTORY — PX: CATARACT EXTRACTION W/PHACO: SHX586

## 2019-12-26 SURGERY — PHACOEMULSIFICATION, CATARACT, WITH IOL INSERTION
Anesthesia: Monitor Anesthesia Care | Site: Eye | Laterality: Right

## 2019-12-26 MED ORDER — EPINEPHRINE PF 1 MG/ML IJ SOLN
INTRAOCULAR | Status: DC | PRN
  Administered 2019-12-26: 500 mL

## 2019-12-26 MED ORDER — LIDOCAINE HCL (PF) 1 % IJ SOLN
INTRAOCULAR | Status: DC | PRN
  Administered 2019-12-26: 1 mL via OPHTHALMIC

## 2019-12-26 MED ORDER — PHENYLEPHRINE HCL 2.5 % OP SOLN
1.0000 [drp] | OPHTHALMIC | Status: AC | PRN
  Administered 2019-12-26 (×3): 1 [drp] via OPHTHALMIC

## 2019-12-26 MED ORDER — BSS IO SOLN
INTRAOCULAR | Status: DC | PRN
  Administered 2019-12-26: 15 mL via INTRAOCULAR

## 2019-12-26 MED ORDER — POVIDONE-IODINE 5 % OP SOLN
OPHTHALMIC | Status: DC | PRN
  Administered 2019-12-26: 1 via OPHTHALMIC

## 2019-12-26 MED ORDER — EPINEPHRINE PF 1 MG/ML IJ SOLN
INTRAMUSCULAR | Status: AC
  Filled 2019-12-26: qty 2

## 2019-12-26 MED ORDER — PROVISC 10 MG/ML IO SOLN
INTRAOCULAR | Status: DC | PRN
  Administered 2019-12-26: 0.85 mL via INTRAOCULAR

## 2019-12-26 MED ORDER — SODIUM HYALURONATE 23 MG/ML IO SOLN
INTRAOCULAR | Status: DC | PRN
  Administered 2019-12-26: 0.6 mL via INTRAOCULAR

## 2019-12-26 MED ORDER — LIDOCAINE HCL 3.5 % OP GEL
1.0000 "application " | Freq: Once | OPHTHALMIC | Status: AC
  Administered 2019-12-26: 1 via OPHTHALMIC

## 2019-12-26 MED ORDER — CYCLOPENTOLATE-PHENYLEPHRINE 0.2-1 % OP SOLN
1.0000 [drp] | OPHTHALMIC | Status: AC | PRN
  Administered 2019-12-26 (×3): 1 [drp] via OPHTHALMIC

## 2019-12-26 MED ORDER — NEOMYCIN-POLYMYXIN-DEXAMETH 3.5-10000-0.1 OP SUSP
OPHTHALMIC | Status: DC | PRN
  Administered 2019-12-26: 1 [drp] via OPHTHALMIC

## 2019-12-26 MED ORDER — TETRACAINE HCL 0.5 % OP SOLN
1.0000 [drp] | OPHTHALMIC | Status: AC | PRN
  Administered 2019-12-26 (×3): 1 [drp] via OPHTHALMIC

## 2019-12-26 SURGICAL SUPPLY — 13 items
CLOTH BEACON ORANGE TIMEOUT ST (SAFETY) ×1 IMPLANT
EYE SHIELD UNIVERSAL CLEAR (GAUZE/BANDAGES/DRESSINGS) ×1 IMPLANT
GLOVE BIOGEL PI IND STRL 7.0 (GLOVE) IMPLANT
GLOVE BIOGEL PI INDICATOR 7.0 (GLOVE) ×2
LENS ALC ACRYL/TECN (Ophthalmic Related) ×1 IMPLANT
NDL HYPO 18GX1.5 BLUNT FILL (NEEDLE) IMPLANT
NEEDLE HYPO 18GX1.5 BLUNT FILL (NEEDLE) ×2 IMPLANT
PAD ARMBOARD 7.5X6 YLW CONV (MISCELLANEOUS) ×1 IMPLANT
SYR TB 1ML LL NO SAFETY (SYRINGE) ×1 IMPLANT
TAPE SURG TRANSPORE 1 IN (GAUZE/BANDAGES/DRESSINGS) IMPLANT
TAPE SURGICAL TRANSPORE 1 IN (GAUZE/BANDAGES/DRESSINGS) ×2
VISCOELASTIC ADDITIONAL (OPHTHALMIC RELATED) ×1 IMPLANT
WATER STERILE IRR 250ML POUR (IV SOLUTION) ×1 IMPLANT

## 2019-12-26 NOTE — Anesthesia Postprocedure Evaluation (Signed)
Anesthesia Post Note  Patient: Micheal Serrano  Procedure(s) Performed: CATARACT EXTRACTION PHACO AND INTRAOCULAR LENS PLACEMENT RIGHT EYE (Right Eye)  Patient location during evaluation: Phase II Anesthesia Type: MAC Level of consciousness: awake, awake and alert, oriented and patient cooperative Pain management: pain level controlled Vital Signs Assessment: post-procedure vital signs reviewed and stable Respiratory status: spontaneous breathing, nonlabored ventilation and respiratory function stable Cardiovascular status: blood pressure returned to baseline and stable Anesthetic complications: no   No complications documented.   Last Vitals:  Vitals:   12/26/19 0722  BP: 116/73  Pulse: 70  Resp: 18  Temp: 36.7 C  SpO2: 96%    Last Pain:  Vitals:   12/26/19 0722  TempSrc: Oral  PainSc: 0-No pain                 Idrees Quam L

## 2019-12-26 NOTE — Interval H&P Note (Signed)
History and Physical Interval Note:  12/26/2019 8:27 AM  Micheal Serrano  has presented today for surgery, with the diagnosis of Nuclear sclerotic cataract - Right eye.  The various methods of treatment have been discussed with the patient and family. After consideration of risks, benefits and other options for treatment, the patient has consented to  Procedure(s) with comments: CATARACT EXTRACTION PHACO AND INTRAOCULAR LENS PLACEMENT RIGHT EYE (Right) - right as a surgical intervention.  The patient's history has been reviewed, patient examined, no change in status, stable for surgery.  I have reviewed the patient's chart and labs.  Questions were answered to the patient's satisfaction.     Baruch Goldmann

## 2019-12-26 NOTE — Discharge Instructions (Signed)
Please discharge patient when stable, will follow up today with Dr. Latayna Ritchie at the [facility] [place] office immediately following discharge.  Leave shield in place until visit.  All paperwork with discharge instructions will be given at the office.  [facility] [place] Address:  [address]  [place], [place] [address]  

## 2019-12-26 NOTE — Op Note (Signed)
Date of procedure: 12/26/19  Pre-operative diagnosis: Visually significant age-related nuclear cataract, Right Eye (H25.11)  Post-operative diagnosis: Visually significant age-related nuclear cataract, Right Eye  Procedure: Removal of cataract via phacoemulsification and insertion of intra-ocular lens Wynetta Emery and [employer] DIB00  +20.5D into the capsular bag of the Right Eye  Attending surgeon: Gerda Diss. Spike Desilets, MD, MA  Anesthesia: MAC, Topical Akten  Complications: None  Estimated Blood Loss: <73m (minimal)  Specimens: None  Implants: As above  Indications:  Visually significant age-related cataract, Right Eye  Procedure:  The patient was seen and identified in the pre-operative area. The operative eye was identified and dilated.  The operative eye was marked.  Topical anesthesia was administered to the operative eye.     The patient was then to the operative suite and placed in the supine position.  A timeout was performed confirming the patient, procedure to be performed, and all other relevant information.   The patient's face was prepped and draped in the usual fashion for intra-ocular surgery.  A lid speculum was placed into the operative eye and the surgical microscope moved into place and focused.  A superotemporal paracentesis was created using a 20 gauge paracentesis blade.  Shugarcaine was injected into the anterior chamber.  Viscoelastic was injected into the anterior chamber.  A temporal clear-corneal main wound incision was created using a 2.469mmicrokeratome.  A continuous curvilinear capsulorrhexis was initiated using an irrigating cystitome and completed using capsulorrhexis forceps.  Hydrodissection and hydrodeliniation were performed.  Viscoelastic was injected into the anterior chamber.  A phacoemulsification handpiece and a chopper as a second instrument were used to remove the nucleus and epinucleus. The irrigation/aspiration handpiece was used to remove any  remaining cortical material.   The capsular bag was reinflated with viscoelastic, checked, and found to be intact.  The intraocular lens was inserted into the capsular bag.  The irrigation/aspiration handpiece was used to remove any remaining viscoelastic.  The clear corneal wound and paracentesis wounds were then hydrated and checked with Weck-Cels to be watertight.  The lid-speculum and drape was removed, and the patient's face was cleaned with a wet and dry 4x4.  Maxitrol was instilled in the eye before a clear shield was taped over the eye. The patient was taken to the post-operative care unit in good condition, having tolerated the procedure well.  Post-Op Instructions: [facility] and will receive all other orders and instructions.

## 2019-12-26 NOTE — Anesthesia Preprocedure Evaluation (Signed)
Anesthesia Evaluation  Patient identified by MRN, date of birth, ID band Patient awake    Reviewed: Allergy & Precautions, NPO status , Patient's Chart, lab work & pertinent test results  Airway Mallampati: II       Dental no notable dental hx.    Pulmonary neg pulmonary ROS, Current Smoker,    Pulmonary exam normal        Cardiovascular hypertension, negative cardio ROS Normal cardiovascular exam     Neuro/Psych PSYCHIATRIC DISORDERS negative neurological ROS     GI/Hepatic negative GI ROS, Neg liver ROS,   Endo/Other  negative endocrine ROS  Renal/GU negative Renal ROS  negative genitourinary   Musculoskeletal negative musculoskeletal ROS (+)   Abdominal   Peds negative pediatric ROS (+)  Hematology negative hematology ROS (+)   Anesthesia Other Findings   Reproductive/Obstetrics negative OB ROS                             Anesthesia Physical Anesthesia Plan  ASA: II  Anesthesia Plan: MAC   Post-op Pain Management:    Induction:   PONV Risk Score and Plan:   Airway Management Planned:   Additional Equipment:   Intra-op Plan:   Post-operative Plan:   Informed Consent:   Plan Discussed with: CRNA  Anesthesia Plan Comments:         Anesthesia Quick Evaluation

## 2019-12-26 NOTE — Transfer of Care (Signed)
Immediate Anesthesia Transfer of Care Note  Patient: Micheal Serrano  Procedure(s) Performed: CATARACT EXTRACTION PHACO AND INTRAOCULAR LENS PLACEMENT RIGHT EYE (Right Eye)  Patient Location: PACU  Anesthesia Type:MAC  Level of Consciousness: awake, alert , oriented and patient cooperative  Airway & Oxygen Therapy: Patient Spontanous Breathing  Post-op Assessment: Report given to RN and Post -op Vital signs reviewed and stable  Post vital signs: Reviewed and stable  Last Vitals:  Vitals Value Taken Time  BP 144/76   Temp    Pulse 62   Resp    SpO2 98     Last Pain:  Vitals:   12/26/19 0722  TempSrc: Oral  PainSc: 0-No pain         Complications: No complications documented.

## 2019-12-29 ENCOUNTER — Encounter (HOSPITAL_COMMUNITY): Payer: Self-pay | Admitting: Ophthalmology

## 2020-02-05 ENCOUNTER — Encounter (INDEPENDENT_AMBULATORY_CARE_PROVIDER_SITE_OTHER): Payer: Self-pay | Admitting: *Deleted

## 2020-02-26 DIAGNOSIS — L820 Inflamed seborrheic keratosis: Secondary | ICD-10-CM | POA: Diagnosis not present

## 2020-02-26 DIAGNOSIS — L570 Actinic keratosis: Secondary | ICD-10-CM | POA: Diagnosis not present

## 2020-02-26 DIAGNOSIS — X32XXXD Exposure to sunlight, subsequent encounter: Secondary | ICD-10-CM | POA: Diagnosis not present

## 2020-02-26 DIAGNOSIS — D0462 Carcinoma in situ of skin of left upper limb, including shoulder: Secondary | ICD-10-CM | POA: Diagnosis not present

## 2020-03-19 DIAGNOSIS — M25441 Effusion, right hand: Secondary | ICD-10-CM | POA: Diagnosis not present

## 2020-03-19 DIAGNOSIS — M0579 Rheumatoid arthritis with rheumatoid factor of multiple sites without organ or systems involvement: Secondary | ICD-10-CM | POA: Diagnosis not present

## 2020-03-19 DIAGNOSIS — R768 Other specified abnormal immunological findings in serum: Secondary | ICD-10-CM | POA: Diagnosis not present

## 2020-03-19 DIAGNOSIS — M25552 Pain in left hip: Secondary | ICD-10-CM | POA: Diagnosis not present

## 2020-03-19 DIAGNOSIS — M25551 Pain in right hip: Secondary | ICD-10-CM | POA: Diagnosis not present

## 2020-04-18 ENCOUNTER — Encounter: Payer: Self-pay | Admitting: Emergency Medicine

## 2020-04-18 ENCOUNTER — Ambulatory Visit
Admission: EM | Admit: 2020-04-18 | Discharge: 2020-04-18 | Disposition: A | Discharge status: Home / Self Care | Payer: BC Managed Care – PPO | Attending: Emergency Medicine | Admitting: Emergency Medicine

## 2020-04-18 ENCOUNTER — Other Ambulatory Visit: Payer: Self-pay

## 2020-04-18 DIAGNOSIS — R059 Cough, unspecified: Secondary | ICD-10-CM

## 2020-04-18 DIAGNOSIS — J069 Acute upper respiratory infection, unspecified: Secondary | ICD-10-CM

## 2020-04-18 MED ORDER — DEXAMETHASONE 4 MG PO TABS: 4.0000 mg | ORAL_TABLET | Freq: Every day | ORAL | 0 refills | Status: AC

## 2020-04-18 MED ORDER — AZITHROMYCIN 250 MG PO TABS
250.0000 mg | ORAL_TABLET | Freq: Every day | ORAL | 0 refills | Status: DC
Start: 2020-04-18 — End: 2020-09-18

## 2020-04-18 MED ORDER — BENZONATATE 100 MG PO CAPS
100.0000 mg | ORAL_CAPSULE | Freq: Three times a day (TID) | ORAL | 0 refills | Status: DC
Start: 2020-04-18 — End: 2020-09-18

## 2020-04-18 NOTE — ED Triage Notes (Signed)
Itchy throat and coughing  x 3 days

## 2020-04-18 NOTE — Discharge Instructions (Addendum)
Get plenty of rest and push fluids Tessalon Perles prescribed for cough Azithromycin was prescribed  Decadron was prescribed Use medications daily for symptom relief Use OTC medications like ibuprofen or tylenol as needed fever or pain Call or go to the ED if you have any new or worsening symptoms such as fever, worsening cough, shortness of breath, chest tightness, chest pain, turning blue, changes in mental status, etc..Marland Kitchen

## 2020-04-18 NOTE — ED Provider Notes (Signed)
[place]   [id number] 04/18/20 Arrival Time: 2353   CC: COVID symptoms  SUBJECTIVE: History from: patient.  Micheal Serrano is a 64 y.o. male who presented to the urgent care with a complaint of cough, congestion nasal congestion with green discharge and itchy throat for the past 3 days.  Denies sick exposure to COVID, flu or strep.  Denies recent travel.  Has tried OTC meds without relief. Denies aggravating factors. Denies previous symptoms in the past.   Denies fever, chills, fatigue, sinus pain, rhinorrhea, sore throat, SOB, wheezing, chest pain, nausea, changes in bowel or bladder habits.    ROS: As per HPI.  All other pertinent ROS negative.     Past Medical History:  Diagnosis Date  . Alcohol abuse   . GERD (gastroesophageal reflux disease)   . Hepatitis C    states this has been treated and is clear  . Hypertension   . Neck pain    Past Surgical History:  Procedure Laterality Date  . CATARACT EXTRACTION W/PHACO Right 12/26/2019   Procedure: CATARACT EXTRACTION PHACO AND INTRAOCULAR LENS PLACEMENT RIGHT EYE;  Surgeon: Baruch Goldmann, MD;  Location: [facility];  Service: Ophthalmology;  Laterality: Right;  CDE: 11.65  . CERVICAL FUSION  06/2013  . CHOLECYSTECTOMY    . COLONOSCOPY N/A 01/30/2013   Procedure: COLONOSCOPY;  Surgeon: Rogene Houston, MD;  Location: [facility];  Service: Endoscopy;  Laterality: N/A;  100-moved to 12:00 Ann to notify pt  . HERNIA REPAIR    . KNEE ARTHROSCOPY Right   . LUMBAR LAMINECTOMY/DECOMPRESSION MICRODISCECTOMY Left 06/03/2014   Procedure: LUMBAR FIVE-SACRAL ONE LUMBAR LAMINECTOMY/DECOMPRESSION MICRODISCECTOMY 1 LEVEL;  Surgeon: Hosie Spangle, MD;  Location: [place] [facility];  Service: Neurosurgery;  Laterality: Left;  Left L5S1 laminotomy and microdiskectomy  . VASECTOMY     No Known Allergies No current facility-administered medications on file prior to encounter.   Current Outpatient Medications on File Prior to Encounter    Medication Sig Dispense Refill  . ENBREL SURECLICK 50 MG/ML injection Inject 50 mg into the skin once a week.     . olmesartan (BENICAR) 40 MG tablet Take 40 mg by mouth daily.     Social History   Socioeconomic History  . Marital status: Married    Spouse name: Not on file  . Number of children: Not on file  . Years of education: Not on file  . Highest education level: Not on file  Occupational History  . Not on file  Tobacco Use  . Smoking status: Current Every Day Smoker    Packs/day: 1.00    Years: 25.00    Pack years: 25.00    Types: Cigarettes  . Smokeless tobacco: Never Used  . Tobacco comment: 1 pack a day x  x 30 yrs  Vaping Use  . Vaping Use: Never used  Substance and Sexual Activity  . Alcohol use: Yes    Alcohol/week: 14.0 standard drinks    Types: 14 Cans of beer per week    Comment: 2-3 beers a day  . Drug use: No  . Sexual activity: Not on file  Other Topics Concern  . Not on file  Social History Narrative  . Not on file   Social Determinants of Health   Financial Resource Strain:   . Difficulty of Paying Living Expenses: Not on file  Food Insecurity:   . Worried About Charity fundraiser in the Last Year: Not on file  . Ran  Out of Food in the Last Year: Not on file  Transportation Needs:   . Lack of Transportation (Medical): Not on file  . Lack of Transportation (Non-Medical): Not on file  Physical Activity:   . Days of Exercise per Week: Not on file  . Minutes of Exercise per Session: Not on file  Stress:   . Feeling of Stress : Not on file  Social Connections:   . Frequency of Communication with Friends and Family: Not on file  . Frequency of Social Gatherings with Friends and Family: Not on file  . Attends Religious Services: Not on file  . Active Member of Clubs or Organizations: Not on file  . Attends Archivist Meetings: Not on file  . Marital Status: Not on file  Intimate Partner Violence:   . Fear of Current or Ex-Partner:  Not on file  . Emotionally Abused: Not on file  . Physically Abused: Not on file  . Sexually Abused: Not on file   Family History  Problem Relation Age of Onset  . Breast cancer Mother   . Thyroid disease Mother   . Stroke Mother   . Dementia Father   . Colon cancer Neg Hx     OBJECTIVE:  Vitals:   04/18/20 1522 04/18/20 1524  BP:  120/70  Pulse:  73  Resp:  18  Temp:  98.4 F (36.9 C)  TempSrc:  Oral  SpO2:  100%  Weight: 160 lb (72.6 kg)   Height: 5\' 6"  (1.676 m)      General appearance: alert; appears fatigued, but nontoxic; speaking in full sentences and tolerating own secretions HEENT: NCAT; Ears: EACs clear, TMs pearly gray; Eyes: PERRL.  EOM grossly intact. Sinuses: nontender; Nose: nares patent without rhinorrhea, Throat: oropharynx clear, tonsils non erythematous or enlarged, uvula midline  Neck: supple without LAD Lungs: unlabored respirations, symmetrical air entry; cough: mild; no respiratory distress; CTAB Heart: regular rate and rhythm.  Radial pulses 2+ symmetrical bilaterally Skin: warm and dry Psychological: alert and cooperative; normal mood and affect  LABS:  No results found for this or any previous visit (from the past 24 hour(s)).   ASSESSMENT & PLAN:  1. Cough   2. Acute URI     Meds ordered this encounter  Medications  . azithromycin (ZITHROMAX) 250 MG tablet    Sig: Take 1 tablet (250 mg total) by mouth daily. Take first 2 tablets together, then 1 every day until finished.    Dispense:  6 tablet    Refill:  0  . benzonatate (TESSALON) 100 MG capsule    Sig: Take 1 capsule (100 mg total) by mouth every 8 (eight) hours.    Dispense:  30 capsule    Refill:  0  . dexamethasone (DECADRON) 4 MG tablet    Sig: Take 1 tablet (4 mg total) by mouth daily for 7 days.    Dispense:  7 tablet    Refill:  0    Discharge instructions   Get plenty of rest and push fluids Tessalon Perles prescribed for cough Azithromycin was prescribed    Decadron was prescribed Use medications daily for symptom relief Use OTC medications like ibuprofen or tylenol as needed fever or pain Call or go to the ED if you have any new or worsening symptoms such as fever, worsening cough, shortness of breath, chest tightness, chest pain, turning blue, changes in mental status, etc...   Reviewed expectations re: course of current medical issues. Questions answered. Outlined  signs and symptoms indicating need for more acute intervention. Patient verbalized understanding. After Visit Summary given.         Emerson Monte, FNP 04/18/20 1540

## 2020-04-28 ENCOUNTER — Emergency Department (HOSPITAL_COMMUNITY)
Admission: EM | Admit: 2020-04-28 | Discharge: 2020-04-28 | Disposition: A | Discharge status: Home / Self Care | Payer: BC Managed Care – PPO | Attending: Emergency Medicine | Admitting: Emergency Medicine

## 2020-04-28 ENCOUNTER — Other Ambulatory Visit: Payer: Self-pay

## 2020-04-28 ENCOUNTER — Encounter (HOSPITAL_COMMUNITY): Payer: Self-pay

## 2020-04-28 DIAGNOSIS — M25521 Pain in right elbow: Secondary | ICD-10-CM | POA: Diagnosis not present

## 2020-04-28 DIAGNOSIS — I10 Essential (primary) hypertension: Secondary | ICD-10-CM | POA: Insufficient documentation

## 2020-04-28 DIAGNOSIS — Z20822 Contact with and (suspected) exposure to covid-19: Secondary | ICD-10-CM | POA: Diagnosis not present

## 2020-04-28 DIAGNOSIS — Z79899 Other long term (current) drug therapy: Secondary | ICD-10-CM | POA: Insufficient documentation

## 2020-04-28 DIAGNOSIS — M25512 Pain in left shoulder: Secondary | ICD-10-CM | POA: Diagnosis not present

## 2020-04-28 DIAGNOSIS — M26629 Arthralgia of temporomandibular joint, unspecified side: Secondary | ICD-10-CM | POA: Insufficient documentation

## 2020-04-28 DIAGNOSIS — M25511 Pain in right shoulder: Secondary | ICD-10-CM | POA: Diagnosis not present

## 2020-04-28 DIAGNOSIS — M2550 Pain in unspecified joint: Secondary | ICD-10-CM

## 2020-04-28 DIAGNOSIS — M25522 Pain in left elbow: Secondary | ICD-10-CM | POA: Diagnosis not present

## 2020-04-28 DIAGNOSIS — F17210 Nicotine dependence, cigarettes, uncomplicated: Secondary | ICD-10-CM | POA: Insufficient documentation

## 2020-04-28 LAB — RESP PANEL BY RT PCR (RSV, FLU A&B, COVID)
Influenza A by PCR: NEGATIVE
Influenza B by PCR: NEGATIVE
Respiratory Syncytial Virus by PCR: NEGATIVE
SARS Coronavirus 2 by RT PCR: NEGATIVE

## 2020-04-28 LAB — CBC WITH DIFFERENTIAL/PLATELET
Abs Immature Granulocytes: 0.25 10*3/uL — ABNORMAL HIGH (ref 0.00–0.07)
Basophils Absolute: 0.1 10*3/uL (ref 0.0–0.1)
Basophils Relative: 0 %
Eosinophils Absolute: 0.1 10*3/uL (ref 0.0–0.5)
Eosinophils Relative: 1 %
HCT: 49.4 % (ref 39.0–52.0)
Hemoglobin: 16.3 g/dL (ref 13.0–17.0)
Immature Granulocytes: 2 %
Lymphocytes Relative: 22 %
Lymphs Abs: 3.1 10*3/uL (ref 0.7–4.0)
MCH: 32.6 pg (ref 26.0–34.0)
MCHC: 33 g/dL (ref 30.0–36.0)
MCV: 98.8 fL (ref 80.0–100.0)
Monocytes Absolute: 1.7 10*3/uL — ABNORMAL HIGH (ref 0.1–1.0)
Monocytes Relative: 12 %
Neutro Abs: 9.2 10*3/uL — ABNORMAL HIGH (ref 1.7–7.7)
Neutrophils Relative %: 63 %
Platelets: 177 10*3/uL (ref 150–400)
RBC: 5 MIL/uL (ref 4.22–5.81)
RDW: 12.7 % (ref 11.5–15.5)
WBC: 14.4 10*3/uL — ABNORMAL HIGH (ref 4.0–10.5)
nRBC: 0 % (ref 0.0–0.2)

## 2020-04-28 LAB — BASIC METABOLIC PANEL
Anion gap: 8 (ref 5–15)
BUN: 12 mg/dL (ref 8–23)
CO2: 23 mmol/L (ref 22–32)
Calcium: 8.4 mg/dL — ABNORMAL LOW (ref 8.9–10.3)
Chloride: 102 mmol/L (ref 98–111)
Creatinine, Ser: 0.77 mg/dL (ref 0.61–1.24)
GFR, Estimated: >60 mL/min (ref >60)
Glucose, Bld: 94 mg/dL (ref 70–99)
Potassium: 3.6 mmol/L (ref 3.5–5.1)
Sodium: 133 mmol/L — ABNORMAL LOW (ref 135–145)

## 2020-04-28 MED ORDER — HYDROCODONE-ACETAMINOPHEN 5-325 MG PO TABS: 1.0000 | ORAL_TABLET | ORAL | 0 refills | Status: DC | PRN

## 2020-04-28 MED ORDER — HYDROCODONE-ACETAMINOPHEN 5-325 MG PO TABS
1.0000 | ORAL_TABLET | Freq: Once | ORAL | Status: AC
  Administered 2020-04-28: 1 via ORAL
  Filled 2020-04-28: qty 1

## 2020-04-28 NOTE — ED Provider Notes (Signed)
[facility] EMERGENCY DEPARTMENT Provider Note   CSN: [id number] Arrival date & time: 04/28/20  0846     History Chief Complaint  Patient presents with  . Generalized Body Aches    Micheal Serrano is a 64 y.o. male with a history as outlined below, also including rheumatoid arthritis presenting with all over joint pain without swelling or redness, also no fevers or chills.  He reports his symptoms have been gradual for several Koran but became severe yesterday.  He is not on any medicines for his rheumatoid arthritis, was on a trial of embrel this summer which was not effective.  He was also placed on a prednisone taper several Couper ago by his endocrinologist which was not effective.  However, his current sx are not similar to a rheumatoid flare, denies all over body pain with prior flares, tends to localize to hips.  He denies fevers or chills.  He did have a dry cough last week, seen at Urgent care and prescribed a cough medicine, now improved.  Denies sob, no cp, no abd pain, rash, no localizing joint pain.    HPI     Past Medical History:  Diagnosis Date  . Alcohol abuse   . GERD (gastroesophageal reflux disease)   . Hepatitis C    states this has been treated and is clear  . Hypertension   . Neck pain     Patient Active Problem List   Diagnosis Date Noted  . Alcohol abuse   . Pain in the chest   . Esophageal reflux   . Chest pain 12/12/2014  . HNP (herniated nucleus pulposus), lumbar 06/03/2014  . Hepatitis, chronic active ([place]) 01/06/2013  . Hypokalemia 02/28/2011  . Tobacco abuse 02/28/2011  . GERD (gastroesophageal reflux disease) 02/28/2011  . Transaminitis 02/28/2011  . IFG (impaired fasting glucose) 02/28/2011  . Chest pain 02/27/2011  . HTN (hypertension) 02/27/2011    Past Surgical History:  Procedure Laterality Date  . CATARACT EXTRACTION W/PHACO Right 12/26/2019   Procedure: CATARACT EXTRACTION PHACO AND INTRAOCULAR LENS PLACEMENT RIGHT EYE;  Surgeon:  Baruch Goldmann, MD;  Location: [facility];  Service: Ophthalmology;  Laterality: Right;  CDE: 11.65  . CERVICAL FUSION  06/2013  . CHOLECYSTECTOMY    . COLONOSCOPY N/A 01/30/2013   Procedure: COLONOSCOPY;  Surgeon: Rogene Houston, MD;  [facility]: [facility];  Service: Endoscopy;  Laterality: N/A;  100-moved to 12:00 Ann to notify pt  . HERNIA REPAIR    . KNEE ARTHROSCOPY Right   . LUMBAR LAMINECTOMY/DECOMPRESSION MICRODISCECTOMY Left 06/03/2014   Procedure: LUMBAR FIVE-SACRAL ONE LUMBAR LAMINECTOMY/DECOMPRESSION MICRODISCECTOMY 1 LEVEL;  Surgeon: Hosie Spangle, MD;  Location: [place] [facility];  Service: Neurosurgery;  Laterality: Left;  Left L5S1 laminotomy and microdiskectomy  . VASECTOMY         Family History  Problem Relation Age of Onset  . Breast cancer Mother   . Thyroid disease Mother   . Stroke Mother   . Dementia Father   . Colon cancer Neg Hx     Social History   Tobacco Use  . Smoking status: Current Every Day Smoker    Packs/day: 1.00    Years: 25.00    Pack years: 25.00    Types: Cigarettes  . Smokeless tobacco: Never Used  . Tobacco comment: 1 pack a day x  x 30 yrs  Vaping Use  . Vaping Use: Never used  Substance Use Topics  . Alcohol use: Yes    Alcohol/week: 14.0  standard drinks    Types: 14 Cans of beer per week    Comment: 2-3 beers a day  . Drug use: No    Home Medications Prior to Admission medications   Medication Sig Start Date End Date Taking? Authorizing Provider  azithromycin (ZITHROMAX) 250 MG tablet Take 1 tablet (250 mg total) by mouth daily. Take first 2 tablets together, then 1 every day until finished. 04/18/20   Avegno, Darrelyn Hillock, FNP  benzonatate (TESSALON) 100 MG capsule Take 1 capsule (100 mg total) by mouth every 8 (eight) hours. 04/18/20   Avegno, Darrelyn Hillock, FNP  ENBREL SURECLICK 50 MG/ML injection Inject 50 mg into the skin once a week.  11/19/19   [provider]  olmesartan (BENICAR) 40 MG tablet Take 40 mg by mouth daily.     [provider]    Allergies    Patient has no known allergies.  Review of Systems   Review of Systems  Constitutional: Negative for chills and fever.  HENT: Negative for congestion and sore throat.   Eyes: Negative.   Respiratory: Positive for cough. Negative for chest tightness and shortness of breath.   Cardiovascular: Negative for chest pain and palpitations.  Gastrointestinal: Negative for abdominal pain, nausea and vomiting.  Genitourinary: Negative.   Musculoskeletal: Positive for arthralgias. Negative for joint swelling, myalgias, neck pain and neck stiffness.  Skin: Negative.  Negative for rash and wound.  Neurological: Negative for dizziness, weakness, light-headedness, numbness and headaches.  Psychiatric/Behavioral: Negative.   All other systems reviewed and are negative.   Physical Exam Updated Vital Signs BP 129/63 (BP Location: Right Arm)   Pulse 77   Temp 98.2 F (36.8 C) (Oral)   Resp 18   Ht 5\' 6"  (1.676 m)   Wt 72.6 kg   SpO2 98%   BMI 25.82 kg/m   Physical Exam Vitals and nursing note reviewed.  Constitutional:      Appearance: He is well-developed.  HENT:     Head: Normocephalic and atraumatic.     Mouth/Throat:     Mouth: Mucous membranes are moist.     Pharynx: Oropharynx is clear.  Eyes:     Conjunctiva/sclera: Conjunctivae normal.  Cardiovascular:     Rate and Rhythm: Normal rate and regular rhythm.     Heart sounds: Normal heart sounds.  Pulmonary:     Effort: Pulmonary effort is normal.     Breath sounds: Normal breath sounds. No wheezing.  Musculoskeletal:        General: No swelling or deformity. Normal range of motion.     Cervical back: Normal range of motion.     Comments: Pain in joints, generally with ROM, no edema or erythema noted hands, elbows, shoulders.    Skin:    General: Skin is warm and dry.  Neurological:     Mental Status: He is alert.     ED Results / Procedures / Treatments   Labs (all labs  ordered are listed, but only abnormal results are displayed) Labs Reviewed  CBC WITH DIFFERENTIAL/PLATELET - Abnormal; Notable for the following components:      Result Value   WBC 14.4 (*)    Neutro Abs 9.2 (*)    Monocytes Absolute 1.7 (*)    Abs Immature Granulocytes 0.25 (*)    All other components within normal limits  BASIC METABOLIC PANEL - Abnormal; Notable for the following components:   Sodium 133 (*)    Calcium 8.4 (*)    All  other components within normal limits  RESP PANEL BY RT PCR (RSV, FLU A&B, COVID)    EKG None  Radiology No results found.  Procedures Procedures (including critical care time)  Medications Ordered in ED Medications  HYDROcodone-acetaminophen (NORCO/VICODIN) 5-325 MG per tablet 1 tablet (1 tablet Oral Given 04/28/20 1215)    ED Course  I have reviewed the triage vital signs and the nursing notes.  Pertinent labs & imaging results that were available during my care of the patient were reviewed by me and considered in my medical decision making (see chart for details).    MDM Rules/Calculators/A&P                          Pt with generalized arthralgia of unclear etiology, possible rheumatoid flare, but pt states not similar.  He has an elevated wbc count, but afebrile, no localizing pain, no redness or swollen joints, septic arthritis not suggested by presentation.  Possibly leukocytosis secondary to recent steroid use or pain reaction.  He was prescribed a small quantity of hydrocodone.  Advised f/u with rheumatology within 1 week for further assessment . Final Clinical Impression(s) / ED Diagnoses Final diagnoses:  Arthralgia, unspecified joint    Rx / DC Orders ED Discharge Orders    None       Landis Martins 04/28/20 1433    Milton Ferguson, MD 04/30/20 [phone]

## 2020-04-28 NOTE — Discharge Instructions (Addendum)
Your lab tests including your Covid and flu testing are negative today.  You do have an elevated white blood cell count which is nonspecific finding, can be elevated with infection, pain or recent exposure to steroids which you have had.  I recommend followup with your rheumatolgist for further evaluation of your symptoms.  Call for an appointment.  You may take the hydrocodone prescribed for pain relief.  This will make you drowsy - do not drive within 4 hours of taking this medication.

## 2020-04-28 NOTE — ED Triage Notes (Signed)
Pt presents to ED with body pain. Pt states he is hurting in "every joint he has." Pt sees rheumatologist but hasn't seen in about 1-2 months.

## 2020-04-29 DIAGNOSIS — I10 Essential (primary) hypertension: Secondary | ICD-10-CM | POA: Diagnosis not present

## 2020-04-29 DIAGNOSIS — Z6826 Body mass index (BMI) 26.0-26.9, adult: Secondary | ICD-10-CM | POA: Diagnosis not present

## 2020-04-29 DIAGNOSIS — M069 Rheumatoid arthritis, unspecified: Secondary | ICD-10-CM | POA: Diagnosis not present

## 2020-05-03 DIAGNOSIS — M064 Inflammatory polyarthropathy: Secondary | ICD-10-CM | POA: Diagnosis not present

## 2020-05-04 DIAGNOSIS — M25551 Pain in right hip: Secondary | ICD-10-CM | POA: Diagnosis not present

## 2020-05-04 DIAGNOSIS — R768 Other specified abnormal immunological findings in serum: Secondary | ICD-10-CM | POA: Diagnosis not present

## 2020-05-04 DIAGNOSIS — M0579 Rheumatoid arthritis with rheumatoid factor of multiple sites without organ or systems involvement: Secondary | ICD-10-CM | POA: Diagnosis not present

## 2020-05-04 DIAGNOSIS — M25441 Effusion, right hand: Secondary | ICD-10-CM | POA: Diagnosis not present

## 2020-05-14 DIAGNOSIS — M5416 Radiculopathy, lumbar region: Secondary | ICD-10-CM | POA: Diagnosis not present

## 2020-05-19 DIAGNOSIS — R051 Acute cough: Secondary | ICD-10-CM | POA: Diagnosis not present

## 2020-05-21 ENCOUNTER — Other Ambulatory Visit (HOSPITAL_COMMUNITY): Payer: Self-pay | Admitting: Neurological Surgery

## 2020-05-21 ENCOUNTER — Other Ambulatory Visit: Payer: Self-pay | Admitting: Neurological Surgery

## 2020-05-21 DIAGNOSIS — M5416 Radiculopathy, lumbar region: Secondary | ICD-10-CM

## 2020-06-03 ENCOUNTER — Ambulatory Visit (HOSPITAL_COMMUNITY)
Admission: RE | Admit: 2020-06-03 | Discharge: 2020-06-03 | Disposition: A | Discharge status: Home / Self Care | Payer: BC Managed Care – PPO | Source: Ambulatory Visit | Attending: Neurological Surgery | Admitting: Neurological Surgery

## 2020-06-03 ENCOUNTER — Other Ambulatory Visit: Payer: Self-pay

## 2020-06-03 DIAGNOSIS — M5416 Radiculopathy, lumbar region: Secondary | ICD-10-CM | POA: Insufficient documentation

## 2020-06-03 DIAGNOSIS — M545 Low back pain, unspecified: Secondary | ICD-10-CM | POA: Diagnosis not present

## 2020-06-18 DIAGNOSIS — Z961 Presence of intraocular lens: Secondary | ICD-10-CM | POA: Diagnosis not present

## 2020-06-18 DIAGNOSIS — H353211 Exudative age-related macular degeneration, right eye, with active choroidal neovascularization: Secondary | ICD-10-CM | POA: Diagnosis not present

## 2020-07-02 DIAGNOSIS — R768 Other specified abnormal immunological findings in serum: Secondary | ICD-10-CM | POA: Diagnosis not present

## 2020-07-02 DIAGNOSIS — Z6825 Body mass index (BMI) 25.0-25.9, adult: Secondary | ICD-10-CM | POA: Diagnosis not present

## 2020-07-02 DIAGNOSIS — M5416 Radiculopathy, lumbar region: Secondary | ICD-10-CM | POA: Diagnosis not present

## 2020-07-02 DIAGNOSIS — I10 Essential (primary) hypertension: Secondary | ICD-10-CM | POA: Diagnosis not present

## 2020-07-02 DIAGNOSIS — M0579 Rheumatoid arthritis with rheumatoid factor of multiple sites without organ or systems involvement: Secondary | ICD-10-CM | POA: Diagnosis not present

## 2020-07-02 DIAGNOSIS — M25551 Pain in right hip: Secondary | ICD-10-CM | POA: Diagnosis not present

## 2020-07-02 DIAGNOSIS — M25441 Effusion, right hand: Secondary | ICD-10-CM | POA: Diagnosis not present

## 2020-09-13 ENCOUNTER — Other Ambulatory Visit: Payer: Self-pay

## 2020-09-13 ENCOUNTER — Ambulatory Visit
Admission: EM | Admit: 2020-09-13 | Discharge: 2020-09-13 | Disposition: A | Discharge status: Home / Self Care | Payer: BC Managed Care – PPO | Attending: Family Medicine | Admitting: Family Medicine

## 2020-09-13 ENCOUNTER — Encounter: Payer: Self-pay | Admitting: Emergency Medicine

## 2020-09-13 ENCOUNTER — Ambulatory Visit (INDEPENDENT_AMBULATORY_CARE_PROVIDER_SITE_OTHER): Payer: BC Managed Care – PPO

## 2020-09-13 DIAGNOSIS — X500XXA Overexertion from strenuous movement or load, initial encounter: Secondary | ICD-10-CM | POA: Diagnosis not present

## 2020-09-13 DIAGNOSIS — M5459 Other low back pain: Secondary | ICD-10-CM | POA: Diagnosis not present

## 2020-09-13 DIAGNOSIS — M5137 Other intervertebral disc degeneration, lumbosacral region: Secondary | ICD-10-CM | POA: Diagnosis not present

## 2020-09-13 DIAGNOSIS — M545 Low back pain, unspecified: Secondary | ICD-10-CM | POA: Diagnosis not present

## 2020-09-13 DIAGNOSIS — S39012A Strain of muscle, fascia and tendon of lower back, initial encounter: Secondary | ICD-10-CM

## 2020-09-13 DIAGNOSIS — M51379 Other intervertebral disc degeneration, lumbosacral region without mention of lumbar back pain or lower extremity pain: Secondary | ICD-10-CM

## 2020-09-13 MED ORDER — TIZANIDINE HCL 4 MG PO TABS
4.0000 mg | ORAL_TABLET | Freq: Three times a day (TID) | ORAL | 0 refills | Status: DC | PRN
Start: 2020-09-13 — End: 2020-10-22

## 2020-09-13 MED ORDER — KETOROLAC TROMETHAMINE 30 MG/ML IJ SOLN
30.0000 mg | Freq: Once | INTRAMUSCULAR | Status: AC
  Administered 2020-09-13: 30 mg via INTRAMUSCULAR

## 2020-09-13 NOTE — ED Triage Notes (Signed)
Moved furniture over the weekend and started having back pain on right side.

## 2020-09-13 NOTE — Discharge Instructions (Addendum)
Your x-ray show slight increase of disc narrowing at level T12 on L1 and L1 on L2. Contact your back specialist for further evaluation to determine next course of treatment. Continue Tylenol muscle and back, apply heat, and take muscle relaxer at  bedtime.

## 2020-09-13 NOTE — ED Provider Notes (Signed)
[facility]    CSN: [id number] Arrival date & time: 09/13/20  1150      History   Chief Complaint No chief complaint on file.   HPI Micheal Serrano is a 65 y.o. male.   HPI Patient with a history of chronic back pain presents today lumbar back pain.  Reports moving furniture over the weekend sustained an injury.  He has taken an over the counter  tylenol for muscle pain medication. Reports some relief except for when lying down and certain positional movement. Most recent MRI (04/2020) revealed patient has bulging disc at L4-L5: Broad-based disc bulge with a right paracentral disc.   Past Medical History:  Diagnosis Date  . Alcohol abuse   . GERD (gastroesophageal reflux disease)   . Hepatitis C    states this has been treated and is clear  . Hypertension   . Neck pain     Patient Active Problem List   Diagnosis Date Noted  . Alcohol abuse   . Pain in the chest   . Esophageal reflux   . Chest pain 12/12/2014  . HNP (herniated nucleus pulposus), lumbar 06/03/2014  . Hepatitis, chronic active ([place]) 01/06/2013  . Hypokalemia 02/28/2011  . Tobacco abuse 02/28/2011  . GERD (gastroesophageal reflux disease) 02/28/2011  . Transaminitis 02/28/2011  . IFG (impaired fasting glucose) 02/28/2011  . Chest pain 02/27/2011  . HTN (hypertension) 02/27/2011    Past Surgical History:  Procedure Laterality Date  . CATARACT EXTRACTION W/PHACO Right 12/26/2019   Procedure: CATARACT EXTRACTION PHACO AND INTRAOCULAR LENS PLACEMENT RIGHT EYE;  Surgeon: Baruch Goldmann, MD;  Location: [facility];  Service: Ophthalmology;  Laterality: Right;  CDE: 11.65  . CERVICAL FUSION  06/2013  . CHOLECYSTECTOMY    . COLONOSCOPY N/A 01/30/2013   Procedure: COLONOSCOPY;  Surgeon: Rogene Houston, MD;  [facility];  Service: Endoscopy;  Laterality: N/A;  100-moved to 12:00 Ann to notify pt  . HERNIA REPAIR    . KNEE ARTHROSCOPY Right   . LUMBAR LAMINECTOMY/DECOMPRESSION MICRODISCECTOMY  Left 06/03/2014   Procedure: LUMBAR FIVE-SACRAL ONE LUMBAR LAMINECTOMY/DECOMPRESSION MICRODISCECTOMY 1 LEVEL;  Surgeon: Hosie Spangle, MD;  Location: [place] [facility];  Service: Neurosurgery;  Laterality: Left;  Left L5S1 laminotomy and microdiskectomy  . VASECTOMY         Home Medications    Prior to Admission medications   Medication Sig Start Date End Date Taking? Authorizing Provider  azithromycin (ZITHROMAX) 250 MG tablet Take 1 tablet (250 mg total) by mouth daily. Take first 2 tablets together, then 1 every day until finished. 04/18/20   Avegno, Darrelyn Hillock, FNP  benzonatate (TESSALON) 100 MG capsule Take 1 capsule (100 mg total) by mouth every 8 (eight) hours. 04/18/20   Avegno, Darrelyn Hillock, FNP  ENBREL SURECLICK 50 MG/ML injection Inject 50 mg into the skin once a week.  11/19/19   [provider]  HYDROcodone-acetaminophen (NORCO/VICODIN) 5-325 MG tablet Take 1 tablet by mouth every 4 (four) hours as needed for moderate pain. 04/28/20   Evalee Jefferson, PA-C  olmesartan (BENICAR) 40 MG tablet Take 40 mg by mouth daily.    [provider]    Family History Family History  Problem Relation Age of Onset  . Breast cancer Mother   . Thyroid disease Mother   . Stroke Mother   . Dementia Father   . Colon cancer Neg Hx     Social History Social History   Tobacco Use  . Smoking status:  Current Every Day Smoker    Packs/day: 1.00    Years: 25.00    Pack years: 25.00    Types: Cigarettes  . Smokeless tobacco: Never Used  . Tobacco comment: 1 pack a day x  x 30 yrs  Vaping Use  . Vaping Use: Never used  Substance Use Topics  . Alcohol use: Yes    Alcohol/week: 14.0 standard drinks    Types: 14 Cans of beer per week    Comment: 2-3 beers a day  . Drug use: No     Allergies   Patient has no known allergies.   Review of Systems Review of Systems Pertinent negatives listed in HPI  Physical Exam Triage Vital Signs ED Triage Vitals [09/13/20 1157]  Enc  Vitals Group     BP 120/84     Pulse Rate 86     Resp 18     Temp 97.7 F (36.5 C)     Temp Source Oral     SpO2 93 %     Weight      Height      Head Circumference      Peak Flow      Pain Score 5     Pain Loc      Pain Edu?      Excl. in [place]?    No data found.  Updated Vital Signs BP 120/84 (BP Location: Right Arm)   Pulse 86   Temp 97.7 F (36.5 C) (Oral)   Resp 18   SpO2 93%   Visual Acuity Right Eye Distance:   Left Eye Distance:   Bilateral Distance:    Right Eye Near:   Left Eye Near:    Bilateral Near:     Physical Exam General appearance: alert, well developed, well nourished, cooperative  Head: Normocephalic, without obvious abnormality, atraumatic Respiratory: Respirations even and unlabored, normal respiratory rate Heart: Rate and rhythm normal. No gallop or murmurs noted on exam  Lumbar spine without visible deformity,localized tenderness at L5-L3 with palpation.No mass or bulge present. No gross deformities Skin: Skin color, texture, turgor normal. No rashes seen  Psych: Appropriate mood and affect. Neurologic: GCS 15, normal coordination, normal gait  UC Treatments / Results  Labs (all labs ordered are listed, but only abnormal results are displayed) Labs Reviewed  POCT URINALYSIS DIP (MANUAL ENTRY)    EKG   Radiology DG Lumbar Spine Complete  Result Date: 09/13/2020 CLINICAL DATA:  Localized lumbar pain after moving furniture. EXAM: LUMBAR SPINE - COMPLETE 4+ VIEW COMPARISON:  April 22, 2019 FINDINGS: Lumbar spinal alignment is maintained. Vertebral body heights are normal. No sign of fracture. Approximately 3 mm retrolisthesis of T12 on L1. Similar mild retrolisthesis of L1 on L2. Not definitely seen on previous examinations. Disc space narrowing, moderate at T12-L1, L1-L2 with mild disc space narrowing at L2-3, L3-4 and L4-5. Marked disc space narrowing and associated sclerosis of the endplates of L5 and S1. Facet arthropathy greatest at  L4-5 and L5-S1. IMPRESSION: 1. Multilevel degenerative disc disease greatest at L5-S1. 2. Mild retrolisthesis of T12 on L1 and L1 on L2, not seen on the previous exam and associated with some slight increase in disc space narrowing at these levels since the prior study from November of 2020. Electronically Signed   By: Zetta Bills M.D.   On: 09/13/2020 12:54  Procedures (including critical care time)  Medications Ordered in UC Medications - No data to display  Initial Impression / Assessment and Plan /  UC Course  I have reviewed the triage vital signs and the nursing notes.  Pertinent labs & imaging results that were available during my care of the patient were reviewed by me and considered in my medical decision making (see chart for details).      Follow-up with back specialist. Continue Tylenol as needed for pain. Toradol 30 mg IM given in clinic for pain. To relieve nighttime back pain tizanidine 4 mg at bedtime or every 8 hours when not driving or working. Patient advised to follow-up with spine specialist given imaged reveal increase in disc space narrowing which is likely source of worsening pain.  Final Clinical Impressions(s) / UC Diagnoses   Final diagnoses:  Degeneration of lumbar or lumbosacral intervertebral disc  Acute myofascial strain of lumbar region, initial encounter     Discharge Instructions     Your x-ray show slight increase of disc narrowing at level T12 on L1 and L1 on L2. Contact your back specialist for further evaluation to determine next course of treatment. Continue Tylenol muscle and back, apply heat, and take muscle relaxer at  bedtime.    ED Prescriptions    Medication Sig Dispense Auth. Provider   tiZANidine (ZANAFLEX) 4 MG tablet Take 1 tablet (4 mg total) by mouth every 8 (eight) hours as needed for muscle spasms (Medication cause sedation. Avoid driving.). 30 tablet Scot Jun, FNP     PDMP not reviewed this encounter.   Scot Jun, FNP 09/16/20 1327

## 2020-09-14 ENCOUNTER — Encounter (HOSPITAL_COMMUNITY): Payer: Self-pay | Admitting: Emergency Medicine

## 2020-09-14 ENCOUNTER — Other Ambulatory Visit: Payer: Self-pay

## 2020-09-14 ENCOUNTER — Emergency Department (HOSPITAL_COMMUNITY): Payer: BC Managed Care – PPO

## 2020-09-14 ENCOUNTER — Inpatient Hospital Stay (HOSPITAL_COMMUNITY)
Admission: EM | Admit: 2020-09-14 | Discharge: 2020-09-18 | DRG: 175 | Disposition: A | Discharge status: Home / Self Care | Payer: BC Managed Care – PPO | Attending: Internal Medicine | Admitting: Internal Medicine

## 2020-09-14 DIAGNOSIS — F17210 Nicotine dependence, cigarettes, uncomplicated: Secondary | ICD-10-CM | POA: Diagnosis not present

## 2020-09-14 DIAGNOSIS — I2694 Multiple subsegmental pulmonary emboli without acute cor pulmonale: Secondary | ICD-10-CM

## 2020-09-14 DIAGNOSIS — R079 Chest pain, unspecified: Secondary | ICD-10-CM | POA: Diagnosis not present

## 2020-09-14 DIAGNOSIS — I10 Essential (primary) hypertension: Secondary | ICD-10-CM | POA: Diagnosis not present

## 2020-09-14 DIAGNOSIS — Z20822 Contact with and (suspected) exposure to covid-19: Secondary | ICD-10-CM | POA: Diagnosis not present

## 2020-09-14 DIAGNOSIS — E871 Hypo-osmolality and hyponatremia: Secondary | ICD-10-CM | POA: Diagnosis present

## 2020-09-14 DIAGNOSIS — I2699 Other pulmonary embolism without acute cor pulmonale: Secondary | ICD-10-CM | POA: Diagnosis not present

## 2020-09-14 DIAGNOSIS — K5900 Constipation, unspecified: Secondary | ICD-10-CM | POA: Diagnosis present

## 2020-09-14 DIAGNOSIS — I82409 Acute embolism and thrombosis of unspecified deep veins of unspecified lower extremity: Secondary | ICD-10-CM

## 2020-09-14 DIAGNOSIS — J9811 Atelectasis: Secondary | ICD-10-CM | POA: Diagnosis not present

## 2020-09-14 DIAGNOSIS — K219 Gastro-esophageal reflux disease without esophagitis: Secondary | ICD-10-CM | POA: Diagnosis not present

## 2020-09-14 DIAGNOSIS — G8929 Other chronic pain: Secondary | ICD-10-CM | POA: Diagnosis present

## 2020-09-14 DIAGNOSIS — F1010 Alcohol abuse, uncomplicated: Secondary | ICD-10-CM | POA: Diagnosis not present

## 2020-09-14 DIAGNOSIS — J9601 Acute respiratory failure with hypoxia: Secondary | ICD-10-CM | POA: Diagnosis present

## 2020-09-14 DIAGNOSIS — R0602 Shortness of breath: Secondary | ICD-10-CM | POA: Diagnosis not present

## 2020-09-14 DIAGNOSIS — I2690 Septic pulmonary embolism without acute cor pulmonale: Secondary | ICD-10-CM

## 2020-09-14 DIAGNOSIS — K449 Diaphragmatic hernia without obstruction or gangrene: Secondary | ICD-10-CM | POA: Diagnosis not present

## 2020-09-14 DIAGNOSIS — J90 Pleural effusion, not elsewhere classified: Secondary | ICD-10-CM | POA: Diagnosis not present

## 2020-09-14 DIAGNOSIS — Z79899 Other long term (current) drug therapy: Secondary | ICD-10-CM | POA: Diagnosis not present

## 2020-09-14 HISTORY — DX: Multiple subsegmental thrombotic pulmonary emboli without acute cor pulmonale: I26.94

## 2020-09-14 LAB — D-DIMER, QUANTITATIVE: D-Dimer, Quant: 2.71 ug/mL-FEU — ABNORMAL HIGH (ref 0.00–0.50)

## 2020-09-14 LAB — TROPONIN I (HIGH SENSITIVITY): Troponin I (High Sensitivity): 5 ng/L (ref <18)

## 2020-09-14 LAB — COMPREHENSIVE METABOLIC PANEL
ALT: 16 U/L (ref 0–44)
AST: 13 U/L — ABNORMAL LOW (ref 15–41)
Albumin: 3.5 g/dL (ref 3.5–5.0)
Alkaline Phosphatase: 55 U/L (ref 38–126)
Anion gap: 10 (ref 5–15)
BUN: 9 mg/dL (ref 8–23)
CO2: 18 mmol/L — ABNORMAL LOW (ref 22–32)
Calcium: 8.6 mg/dL — ABNORMAL LOW (ref 8.9–10.3)
Chloride: 106 mmol/L (ref 98–111)
Creatinine, Ser: 0.67 mg/dL (ref 0.61–1.24)
GFR, Estimated: >60 mL/min (ref >60)
Glucose, Bld: 83 mg/dL (ref 70–99)
Potassium: 4 mmol/L (ref 3.5–5.1)
Sodium: 134 mmol/L — ABNORMAL LOW (ref 135–145)
Total Bilirubin: 1.2 mg/dL (ref 0.3–1.2)
Total Protein: 7.1 g/dL (ref 6.5–8.1)

## 2020-09-14 LAB — CBC WITH DIFFERENTIAL/PLATELET
Abs Immature Granulocytes: 0.04 10*3/uL (ref 0.00–0.07)
Basophils Absolute: 0.1 10*3/uL (ref 0.0–0.1)
Basophils Relative: 1 %
Eosinophils Absolute: 0.1 10*3/uL (ref 0.0–0.5)
Eosinophils Relative: 1 %
HCT: 57.6 % — ABNORMAL HIGH (ref 39.0–52.0)
Hemoglobin: 19.7 g/dL — ABNORMAL HIGH (ref 13.0–17.0)
Immature Granulocytes: 0 %
Lymphocytes Relative: 18 %
Lymphs Abs: 2 10*3/uL (ref 0.7–4.0)
MCH: 33.7 pg (ref 26.0–34.0)
MCHC: 34.2 g/dL (ref 30.0–36.0)
MCV: 98.6 fL (ref 80.0–100.0)
Monocytes Absolute: 1.6 10*3/uL — ABNORMAL HIGH (ref 0.1–1.0)
Monocytes Relative: 15 %
Neutro Abs: 7.2 10*3/uL (ref 1.7–7.7)
Neutrophils Relative %: 65 %
Platelets: 151 10*3/uL (ref 150–400)
RBC: 5.84 MIL/uL — ABNORMAL HIGH (ref 4.22–5.81)
RDW: 11.9 % (ref 11.5–15.5)
WBC: 11 10*3/uL — ABNORMAL HIGH (ref 4.0–10.5)
nRBC: 0 % (ref 0.0–0.2)

## 2020-09-14 LAB — URINALYSIS, ROUTINE W REFLEX MICROSCOPIC
Bacteria, UA: NONE SEEN
Bilirubin Urine: NEGATIVE
Glucose, UA: NEGATIVE mg/dL
Ketones, ur: 20 mg/dL — AB
Leukocytes,Ua: NEGATIVE
Nitrite: NEGATIVE
Protein, ur: NEGATIVE mg/dL
Specific Gravity, Urine: 1.024 (ref 1.005–1.030)
pH: 5 (ref 5.0–8.0)

## 2020-09-14 LAB — LIPASE, BLOOD: Lipase: 21 U/L (ref 11–51)

## 2020-09-14 LAB — APTT: aPTT: 66 seconds — ABNORMAL HIGH (ref 24–36)

## 2020-09-14 LAB — RESP PANEL BY RT-PCR (FLU A&B, COVID) ARPGX2
Influenza A by PCR: NEGATIVE
Influenza B by PCR: NEGATIVE
SARS Coronavirus 2 by RT PCR: NEGATIVE

## 2020-09-14 LAB — HIV ANTIBODY (ROUTINE TESTING W REFLEX): HIV Screen 4th Generation wRfx: NONREACTIVE

## 2020-09-14 MED ORDER — HYDROMORPHONE HCL 1 MG/ML IJ SOLN
0.5000 mg | Freq: Once | INTRAMUSCULAR | Status: AC
  Administered 2020-09-14: 0.5 mg via INTRAVENOUS
  Filled 2020-09-14: qty 1

## 2020-09-14 MED ORDER — ENOXAPARIN SODIUM 80 MG/0.8ML ~~LOC~~ SOLN: 75.0000 mg | Freq: Two times a day (BID) | SUBCUTANEOUS | Status: DC

## 2020-09-14 MED ORDER — NICOTINE 21 MG/24HR TD PT24
21.0000 mg | MEDICATED_PATCH | Freq: Every day | TRANSDERMAL | Status: DC
  Administered 2020-09-16 – 2020-09-18 (×3): 21 mg via TRANSDERMAL
  Filled 2020-09-14 (×3): qty 1

## 2020-09-14 MED ORDER — ACETAMINOPHEN 325 MG PO TABS
650.0000 mg | ORAL_TABLET | Freq: Four times a day (QID) | ORAL | Status: DC | PRN
  Administered 2020-09-16 – 2020-09-17 (×4): 650 mg via ORAL
  Filled 2020-09-14 (×4): qty 2

## 2020-09-14 MED ORDER — TIZANIDINE HCL 4 MG PO TABS: 4.0000 mg | ORAL_TABLET | Freq: Three times a day (TID) | ORAL | Status: DC | PRN

## 2020-09-14 MED ORDER — ONDANSETRON HCL 4 MG/2ML IJ SOLN: 4.0000 mg | Freq: Four times a day (QID) | INTRAMUSCULAR | Status: DC | PRN

## 2020-09-14 MED ORDER — HEPARIN (PORCINE) 25000 UT/250ML-% IV SOLN
1200.0000 [IU]/h | INTRAVENOUS | Status: DC
  Administered 2020-09-14: 1200 [IU]/h via INTRAVENOUS
  Filled 2020-09-14: qty 250

## 2020-09-14 MED ORDER — ACETAMINOPHEN 650 MG RE SUPP: 650.0000 mg | Freq: Four times a day (QID) | RECTAL | Status: DC | PRN

## 2020-09-14 MED ORDER — PANTOPRAZOLE SODIUM 40 MG PO TBEC
40.0000 mg | DELAYED_RELEASE_TABLET | Freq: Every day | ORAL | Status: DC
  Administered 2020-09-15 – 2020-09-18 (×4): 40 mg via ORAL
  Filled 2020-09-14 (×4): qty 1

## 2020-09-14 MED ORDER — HEPARIN BOLUS VIA INFUSION
4000.0000 [IU] | Freq: Once | INTRAVENOUS | Status: AC
  Administered 2020-09-14: 4000 [IU] via INTRAVENOUS

## 2020-09-14 MED ORDER — IRBESARTAN 150 MG PO TABS
300.0000 mg | ORAL_TABLET | Freq: Every day | ORAL | Status: DC
  Administered 2020-09-14 – 2020-09-18 (×5): 300 mg via ORAL
  Filled 2020-09-14 (×5): qty 2

## 2020-09-14 MED ORDER — ENOXAPARIN SODIUM 80 MG/0.8ML ~~LOC~~ SOLN
75.0000 mg | SUBCUTANEOUS | Status: AC
  Administered 2020-09-14: 75 mg via SUBCUTANEOUS
  Filled 2020-09-14: qty 0.8

## 2020-09-14 MED ORDER — TIZANIDINE HCL 4 MG PO TABS: 4.0000 mg | ORAL_TABLET | Freq: Two times a day (BID) | ORAL | Status: DC | PRN

## 2020-09-14 MED ORDER — THIAMINE HCL 100 MG PO TABS
100.0000 mg | ORAL_TABLET | Freq: Every day | ORAL | Status: DC
  Administered 2020-09-15 – 2020-09-18 (×4): 100 mg via ORAL
  Filled 2020-09-14 (×4): qty 1

## 2020-09-14 MED ORDER — ONDANSETRON HCL 4 MG PO TABS: 4.0000 mg | ORAL_TABLET | Freq: Four times a day (QID) | ORAL | Status: DC | PRN

## 2020-09-14 MED ORDER — HYDROMORPHONE HCL 1 MG/ML IJ SOLN
1.0000 mg | INTRAMUSCULAR | Status: DC | PRN
  Administered 2020-09-14 – 2020-09-17 (×12): 1 mg via INTRAVENOUS
  Filled 2020-09-14 (×12): qty 1

## 2020-09-14 MED ORDER — OXYCODONE HCL 5 MG PO TABS
5.0000 mg | ORAL_TABLET | Freq: Four times a day (QID) | ORAL | Status: DC | PRN
  Administered 2020-09-15 – 2020-09-18 (×8): 5 mg via ORAL
  Filled 2020-09-14 (×8): qty 1

## 2020-09-14 MED ORDER — IOHEXOL 350 MG/ML SOLN
100.0000 mL | Freq: Once | INTRAVENOUS | Status: AC | PRN
  Administered 2020-09-14: 100 mL via INTRAVENOUS

## 2020-09-14 MED ORDER — FOLIC ACID 1 MG PO TABS
1.0000 mg | ORAL_TABLET | Freq: Every day | ORAL | Status: DC
  Administered 2020-09-15 – 2020-09-18 (×4): 1 mg via ORAL
  Filled 2020-09-14 (×4): qty 1

## 2020-09-14 MED ORDER — ONDANSETRON HCL 4 MG/2ML IJ SOLN
4.0000 mg | Freq: Once | INTRAMUSCULAR | Status: AC
  Administered 2020-09-14: 4 mg via INTRAVENOUS
  Filled 2020-09-14: qty 2

## 2020-09-14 NOTE — ED Notes (Signed)
Pt resting companion at bedside.

## 2020-09-14 NOTE — Progress Notes (Addendum)
ANTICOAGULATION CONSULT NOTE - Initial Consult  Pharmacy Consult for Heparin>>Lovenox Indication: pulmonary embolus  No Known Allergies  Patient Measurements: Height: 5\' 6"  (167.6 cm) Weight: 72.6 kg (160 lb 0.9 oz) IBW/kg (Calculated) : 63.8 HEPARIN DW (KG): 72.6  Vital Signs: Temp: 98.5 F (36.9 C) (04/05 0803) Temp Source: Oral (04/05 0803) BP: 125/77 (04/05 1030) Pulse Rate: 73 (04/05 1030)  Labs: Recent Labs    09/14/20 0825  HGB 19.7*  HCT 57.6*  PLT 151  CREATININE 0.67    Estimated Creatinine Clearance: 85.3 mL/min (by C-G formula based on SCr of 0.67 mg/dL).   Medical History: Past Medical History:  Diagnosis Date  . Alcohol abuse   . GERD (gastroesophageal reflux disease)   . Hepatitis C    states this has been treated and is clear  . Hypertension   . Neck pain     Medications:  See med rec  Assessment: Patient presented with right sided chest pain and shortness of breath. CT angiogram is positive for acute bilateral lower lobe segmental pulmonary emboli. Pharmacy asked to start heparin. Patient is not on any oral anticoagulants  Addendum  D/w Dr Dyann Kief and we will change heparin to Lovenox.  Goal of Therapy:  Anti-Xa = 0.6-1 Monitor platelets by anticoagulation protocol: Yes   Plan:  Dc heparin Lovenox 75mg  SQ q12 CBC q3d  Onnie Boer, PharmD, BCIDP, AAHIVP, CPP Infectious Disease Pharmacist 09/14/2020 2:08 PM

## 2020-09-14 NOTE — Progress Notes (Signed)
Explained usage of IS and Flutter.  Patient was able to reach 1021ml with good patient effort.  Patient did both X10.  Devices left at bedside for patient usage.  RN has placed patient on continuous pulse ox.  Will continue to monitor.

## 2020-09-14 NOTE — ED Triage Notes (Signed)
Pt here for right sided flank pain x3days. States he went to urgent care yesterday where they told him he had a bulging disc. Pt states He wants to be rechecked.

## 2020-09-14 NOTE — ED Provider Notes (Signed)
[facility] EMERGENCY DEPARTMENT Provider Note   CSN: [id number] Arrival date & time: 09/14/20  0753     History Chief Complaint  Patient presents with  . Flank Pain    Micheal Serrano is a 65 y.o. male.  Patient complains of pain in the right flank with inspiration.  The history is provided by the patient and medical records.  Flank Pain This is a new problem. The current episode started more than 2 days ago. The problem occurs constantly. The problem has not changed since onset.Associated symptoms include chest pain. Pertinent negatives include no abdominal pain and no headaches. Nothing aggravates the symptoms. Nothing relieves the symptoms.       Past Medical History:  Diagnosis Date  . Alcohol abuse   . GERD (gastroesophageal reflux disease)   . Hepatitis C    states this has been treated and is clear  . Hypertension   . Neck pain     Patient Active Problem List   Diagnosis Date Noted  . Alcohol abuse   . Pain in the chest   . Esophageal reflux   . Chest pain 12/12/2014  . HNP (herniated nucleus pulposus), lumbar 06/03/2014  . Hepatitis, chronic active ([place]) 01/06/2013  . Hypokalemia 02/28/2011  . Tobacco abuse 02/28/2011  . GERD (gastroesophageal reflux disease) 02/28/2011  . Transaminitis 02/28/2011  . IFG (impaired fasting glucose) 02/28/2011  . Chest pain 02/27/2011  . HTN (hypertension) 02/27/2011    Past Surgical History:  Procedure Laterality Date  . CATARACT EXTRACTION W/PHACO Right 12/26/2019   Procedure: CATARACT EXTRACTION PHACO AND INTRAOCULAR LENS PLACEMENT RIGHT EYE;  Surgeon: Baruch Goldmann, MD;  Location: [facility] ORS;  Service: Ophthalmology;  Laterality: Right;  CDE: 11.65  . CERVICAL FUSION  06/2013  . CHOLECYSTECTOMY    . COLONOSCOPY N/A 01/30/2013   Procedure: COLONOSCOPY;  Surgeon: Rogene Houston, MD;  Location: [facility] END[facility] SUITE;  Service: Endoscopy;  Laterality: N/A;  100-moved to 12:00 Ann to notify pt  . HERNIA REPAIR    . KNEE  ARTHROSCOPY Right   . LUMBAR LAMINECTOMY/DECOMPRESSION MICRODISCECTOMY Left 06/03/2014   Procedure: LUMBAR FIVE-SACRAL ONE LUMBAR LAMINECTOMY/DECOMPRESSION MICRODISCECTOMY 1 LEVEL;  Surgeon: Hosie Spangle, MD;  Location: [facility];  Service: Neurosurgery;  Laterality: Left;  Left L5S1 laminotomy and microdiskectomy  . VASECTOMY         Family History  Problem Relation Age of Onset  . Breast cancer Mother   . Thyroid disease Mother   . Stroke Mother   . Dementia Father   . Colon cancer Neg Hx     Social History   Tobacco Use  . Smoking status: Current Every Day Smoker    Packs/day: 1.00    Years: 25.00    Pack years: 25.00    Types: Cigarettes  . Smokeless tobacco: Never Used  . Tobacco comment: 1 pack a day x  x 30 yrs  Vaping Use  . Vaping Use: Never used  Substance Use Topics  . Alcohol use: Yes    Alcohol/week: 14.0 standard drinks    Types: 14 Cans of beer per week    Comment: 2-3 beers a day  . Drug use: No    Home Medications Prior to Admission medications   Medication Sig Start Date End Date Taking? Authorizing Provider  acetaminophen (TYLENOL) 325 MG tablet Take 650 mg by mouth every 6 (six) hours as needed for mild pain.   Yes [provider]  HUMIRA PEN 40 MG/0.4ML PNKT Inject  40 mg into the skin every 14 (fourteen) days. 08/16/20  Yes [provider]  olmesartan (BENICAR) 40 MG tablet Take 40 mg by mouth daily.   Yes [provider]  tiZANidine (ZANAFLEX) 4 MG tablet Take 1 tablet (4 mg total) by mouth every 8 (eight) hours as needed for muscle spasms (Medication cause sedation. Avoid driving.). 09/13/20  Yes Scot Jun, FNP  azithromycin (ZITHROMAX) 250 MG tablet Take 1 tablet (250 mg total) by mouth daily. Take first 2 tablets together, then 1 every day until finished. Patient not taking: Reported on 09/14/2020 04/18/20   Emerson Monte, FNP  benzonatate (TESSALON) 100 MG capsule Take 1 capsule (100 mg total) by mouth  every 8 (eight) hours. Patient not taking: Reported on 09/14/2020 04/18/20   Emerson Monte, FNP  HYDROcodone-acetaminophen (NORCO/VICODIN) 5-325 MG tablet Take 1 tablet by mouth every 4 (four) hours as needed for moderate pain. Patient not taking: Reported on 09/14/2020 04/28/20   Evalee Jefferson, PA-C    Allergies    Patient has no known allergies.  Review of Systems   Review of Systems  Constitutional: Negative for appetite change and fatigue.  HENT: Negative for congestion, ear discharge and sinus pressure.   Eyes: Negative for discharge.  Respiratory: Negative for cough.   Cardiovascular: Positive for chest pain.  Gastrointestinal: Negative for abdominal pain and diarrhea.  Genitourinary: Positive for flank pain. Negative for frequency and hematuria.  Musculoskeletal: Negative for back pain.  Skin: Negative for rash.  Neurological: Negative for seizures and headaches.  Psychiatric/Behavioral: Negative for hallucinations.    Physical Exam Updated Vital Signs BP 125/77   Pulse 73   Temp 98.5 F (36.9 C) (Oral)   Resp 20   Ht 5\' 6"  (1.676 m)   Wt 72.6 kg   SpO2 92%   BMI 25.83 kg/m   Physical Exam Vitals and nursing note reviewed.  Constitutional:      Appearance: He is well-developed. He is ill-appearing.  HENT:     Head: Normocephalic.     Nose: Nose normal.  Eyes:     General: No scleral icterus.    Conjunctiva/sclera: Conjunctivae normal.  Neck:     Thyroid: No thyromegaly.  Cardiovascular:     Rate and Rhythm: Normal rate and regular rhythm.     Heart sounds: No murmur heard. No friction rub. No gallop.   Pulmonary:     Breath sounds: No stridor. No wheezing or rales.  Chest:     Chest wall: No tenderness.  Abdominal:     General: There is no distension.     Tenderness: There is no abdominal tenderness. There is no rebound.  Musculoskeletal:        General: Normal range of motion.     Cervical back: Neck supple.  Lymphadenopathy:     Cervical: No  cervical adenopathy.  Skin:    Findings: No erythema or rash.  Neurological:     Mental Status: He is alert and oriented to person, place, and time.     Motor: No abnormal muscle tone.     Coordination: Coordination normal.  Psychiatric:        Behavior: Behavior normal.     ED Results / Procedures / Treatments   Labs (all labs ordered are listed, but only abnormal results are displayed) Labs Reviewed  CBC WITH DIFFERENTIAL/PLATELET - Abnormal; Notable for the following components:      Result Value   WBC 11.0 (*)    RBC  5.84 (*)    Hemoglobin 19.7 (*)    HCT 57.6 (*)    Monocytes Absolute 1.6 (*)    All other components within normal limits  COMPREHENSIVE METABOLIC PANEL - Abnormal; Notable for the following components:   Sodium 134 (*)    CO2 18 (*)    Calcium 8.6 (*)    AST 13 (*)    All other components within normal limits  D-DIMER, QUANTITATIVE - Abnormal; Notable for the following components:   D-Dimer, Quant 2.71 (*)    All other components within normal limits  URINALYSIS, ROUTINE W REFLEX MICROSCOPIC - Abnormal; Notable for the following components:   Hgb urine dipstick MODERATE (*)    Ketones, ur 20 (*)    All other components within normal limits  LIPASE, BLOOD  HEPARIN LEVEL (UNFRACTIONATED)  APTT    EKG None  Radiology DG Lumbar Spine Complete  Result Date: 09/13/2020 CLINICAL DATA:  Localized lumbar pain after moving furniture. EXAM: LUMBAR SPINE - COMPLETE 4+ VIEW COMPARISON:  April 22, 2019 FINDINGS: Lumbar spinal alignment is maintained. Vertebral body heights are normal. No sign of fracture. Approximately 3 mm retrolisthesis of T12 on L1. Similar mild retrolisthesis of L1 on L2. Not definitely seen on previous examinations. Disc space narrowing, moderate at T12-L1, L1-L2 with mild disc space narrowing at L2-3, L3-4 and L4-5. Marked disc space narrowing and associated sclerosis of the endplates of L5 and S1. Facet arthropathy greatest at L4-5 and  L5-S1. IMPRESSION: 1. Multilevel degenerative disc disease greatest at L5-S1. 2. Mild retrolisthesis of T12 on L1 and L1 on L2, not seen on the previous exam and associated with some slight increase in disc space narrowing at these levels since the prior study from November of 2020. Electronically Signed   By: Zetta Bills M.D.   On: 09/13/2020 12:54   CT Angio Chest PE W and/or Wo Contrast  Result Date: 09/14/2020 CLINICAL DATA:  Right-sided chest pain and shortness of breath, high probability for PE EXAM: CT ANGIOGRAPHY CHEST WITH CONTRAST TECHNIQUE: Multidetector CT imaging of the chest was performed using the standard protocol during bolus administration of intravenous contrast. Multiplanar CT image reconstructions and MIPs were obtained to evaluate the vascular anatomy. CONTRAST:  133mL OMNIPAQUE IOHEXOL 350 MG/ML SOLN COMPARISON:  None. FINDINGS: Cardiovascular: Positive for central filling defect within segmental arteries of the right and left lower lobes. No evidence of right heart strain. Main pulmonary artery is normal in caliber. The aorta is normal in caliber. Scattered atherosclerotic vascular calcifications. Multifocal coronary artery calcifications. No pericardial effusion. Mediastinum/Nodes: Unremarkable CT appearance of the thyroid gland. No suspicious mediastinal or hilar adenopathy. No soft tissue mediastinal mass. Small hiatal hernia. Lungs/Pleura: Mild centrilobular pulmonary emphysema. Diffuse bilateral lower lobe bronchial wall thickening. Dependent atelectasis bilaterally. Mild ground-glass attenuation airspace opacity in the inferior right lower lobe in the region of the pulmonary embolus likely representing small pulmonary infarct/reperfusion injury. Trace right pleural effusion. Upper Abdomen: No acute abnormality. Musculoskeletal: No acute osseous abnormality. Review of the MIP images confirms the above findings. IMPRESSION: 1. Positive for acute bilateral lower lobe segmental  pulmonary emboli. 2. Mild ground-glass attenuation airspace opacity in the posteroinferior right lower lobe in the region of acute PE likely representing early pulmonary infarction/reperfusion injury. 3. Trace right pleural effusion. 4. Small hiatal hernia. 5. Aortic atherosclerosis and mild centrilobular pulmonary emphysema. 6. Diffuse bilateral lower lobe bronchial wall thickening most consistent with chronic bronchitis. Aortic Atherosclerosis (ICD10-I70.0) and Emphysema (ICD10-J43.9). Electronically Signed   By:  Jacqulynn Cadet M.D.   On: 09/14/2020 10:22   DG Chest Port 1 View  Result Date: 09/14/2020 CLINICAL DATA:  Pain EXAM: PORTABLE CHEST 1 VIEW COMPARISON:  December 12, 2014 FINDINGS: There is atelectatic change in the left base. There is underlying interstitial thickening which is stable. No consolidation apparent. Heart is upper normal in size with pulmonary vascularity normal. There is aortic atherosclerosis. There is postoperative change in the lower cervical spine. There is midthoracic dextroscoliosis. IMPRESSION: Left base atelectasis. No appreciable edema or consolidation. Underlying interstitial thickening may indicate changes of chronic bronchitis. Heart upper normal in size.  Aortic Atherosclerosis (ICD10-I70.0). Electronically Signed   By: Lowella Grip III M.D.   On: 09/14/2020 08:25    Procedures Procedures   Medications Ordered in ED Medications  heparin bolus via infusion 4,000 Units (has no administration in time range)    Followed by  heparin ADULT infusion 100 units/mL (25000 units/242mL) (has no administration in time range)  HYDROmorphone (DILAUDID) injection 0.5 mg (has no administration in time range)  HYDROmorphone (DILAUDID) injection 0.5 mg (0.5 mg Intravenous Given 09/14/20 0825)  ondansetron (ZOFRAN) injection 4 mg (4 mg Intravenous Given 09/14/20 0825)  iohexol (OMNIPAQUE) 350 MG/ML injection 100 mL (100 mLs Intravenous Contrast Given 09/14/20 1004)    ED Course   I have reviewed the triage vital signs and the nursing notes.  Pertinent labs & imaging results that were available during my care of the patient were reviewed by me and considered in my medical decision making (see chart for details). CRITICAL CARE Performed by: Milton Ferguson Total critical care time: 35 minutes Critical care time was exclusive of separately billable procedures and treating other patients. Critical care was necessary to treat or prevent imminent or life-threatening deterioration. Critical care was time spent personally by me on the following activities: development of treatment plan with patient and/or surrogate as well as nursing, discussions with consultants, evaluation of patient's response to treatment, examination of patient, obtaining history from patient or surrogate, ordering and performing treatments and interventions, ordering and review of laboratory studies, ordering and review of radiographic studies, pulse oximetry and re-evaluation of patient's condition.    MDM Rules/Calculators/A&P                          Patient with bilateral pulmonary emboli.  No heart strain.  He was placed on heparin and admitted to medicine Final Clinical Impression(s) / ED Diagnoses Final diagnoses:  Acute septic pulmonary embolism without acute cor pulmonale ([place])    Rx / DC Orders ED Discharge Orders    None       Milton Ferguson, MD 09/14/20 1122

## 2020-09-14 NOTE — ED Notes (Signed)
ED provider at bedside for MSE 

## 2020-09-14 NOTE — H&P (Signed)
History and Physical    CAEDIN MOGAN Serrano:[id number] DOB: [birth year] DOA: 09/14/2020  PCP: Celene Squibb, MD   Patient coming from: Home  I have personally briefly reviewed patient's old medical records in [place]  Chief Complaint: Pleuritic chest pain and shortness of breath  HPI: Micheal Serrano is a 65 y.o. male with medical history significant of alcohol abuse, gastroesophageal disease, prior history of hepatitis C (has completed treatment in the past for this condition), chronic lower back pain and hypertension; who presented to the hospital secondary to worsening pleuritic right-sided chest pain.  Patient reports symptom has been present for the last 2-3 days and worsening.  He reports pain to be worse with deep inspiration, movement and certain positions.  Some intermittent coughing spells especially when engaged in full activity and associated shortness of breath.  Denies fever, chills, productive cough, sick contacts, abdominal pain, dysuria, hematuria, melena, hematochezia, focal weakness or any other complaints. Of note, patient's reported chest pain to be 7-8 out of 10 in intensity without any relieving factors.  Radiation across his back reported.  ED Course: No acute ischemic changes on EKG: Negative troponin, elevated D-dimers; chest x-ray without acute infiltrate.  CT angiogram of the chest demonstrating pulmonary embolism.  Patient pain failed to improve with IV narcotics in the ED and given acuity [facility] was contacted to place in the hospital for further evaluation and management.  Mild tachypnea appreciated; hemodynamically stable and not requiring oxygen supplementation.  Review of Systems: As per HPI otherwise all other systems reviewed and are negative.   Past Medical History:  Diagnosis Date  . Alcohol abuse   . GERD (gastroesophageal reflux disease)   . Hepatitis C    states this has been treated and is clear  . Hypertension   . Neck pain     Past Surgical  History:  Procedure Laterality Date  . CATARACT EXTRACTION W/PHACO Right 12/26/2019   Procedure: CATARACT EXTRACTION PHACO AND INTRAOCULAR LENS PLACEMENT RIGHT EYE;  Surgeon: Baruch Goldmann, MD;  Location: [facility];  Service: Ophthalmology;  Laterality: Right;  CDE: 11.65  . CERVICAL FUSION  06/2013  . CHOLECYSTECTOMY    . COLONOSCOPY N/A 01/30/2013   Procedure: COLONOSCOPY;  Surgeon: Rogene Houston, MD;  Location: [facility];  Service: Endoscopy;  Laterality: N/A;  100-moved to 12:00 Ann to notify pt  . HERNIA REPAIR    . KNEE ARTHROSCOPY Right   . LUMBAR LAMINECTOMY/DECOMPRESSION MICRODISCECTOMY Left 06/03/2014   Procedure: LUMBAR FIVE-SACRAL ONE LUMBAR LAMINECTOMY/DECOMPRESSION MICRODISCECTOMY 1 LEVEL;  Surgeon: Hosie Spangle, MD;  Location: [facility];  Service: Neurosurgery;  Laterality: Left;  Left L5S1 laminotomy and microdiskectomy  . VASECTOMY      Social History  reports that he has been smoking cigarettes. He has a 25.00 pack-year smoking history. He has never used smokeless tobacco. He reports current alcohol use of about 14.0 standard drinks of alcohol per week. He reports that he does not use drugs.  No Known Allergies  Family History  Problem Relation Age of Onset  . Breast cancer Mother   . Thyroid disease Mother   . Stroke Mother   . Dementia Father   . Colon cancer Neg Hx     Prior to Admission medications   Medication Sig Start Date End Date Taking? Authorizing Provider  acetaminophen (TYLENOL) 325 MG tablet Take 650 mg by mouth every 6 (six) hours as needed for mild pain.   Yes [provider]  HUMIRA PEN 40 MG/0.4ML PNKT Inject 40 mg into the skin every 14 (fourteen) days. 08/16/20  Yes [provider]  olmesartan (BENICAR) 40 MG tablet Take 40 mg by mouth daily.   Yes [provider]  tiZANidine (ZANAFLEX) 4 MG tablet Take 1 tablet (4 mg total) by mouth every 8 (eight) hours as needed for muscle spasms (Medication cause sedation.  Avoid driving.). 09/13/20  Yes Scot Jun, FNP  azithromycin (ZITHROMAX) 250 MG tablet Take 1 tablet (250 mg total) by mouth daily. Take first 2 tablets together, then 1 every day until finished. Patient not taking: Reported on 09/14/2020 04/18/20   Emerson Monte, FNP  benzonatate (TESSALON) 100 MG capsule Take 1 capsule (100 mg total) by mouth every 8 (eight) hours. Patient not taking: Reported on 09/14/2020 04/18/20   Emerson Monte, FNP  HYDROcodone-acetaminophen (NORCO/VICODIN) 5-325 MG tablet Take 1 tablet by mouth every 4 (four) hours as needed for moderate pain. Patient not taking: Reported on 09/14/2020 04/28/20   Evalee Jefferson, PA-C    Physical Exam: Vitals:   09/14/20 1000 09/14/20 1030 09/14/20 1100 09/14/20 1507  BP: 123/76 125/77 (!) 125/92 137/76  Pulse: 78 73 72 79  Resp:  18 (!) 22 20  Temp:    (!) 97.5 F (36.4 C)  TempSrc:    Oral  SpO2: 95% 92% 93% 94%  Weight:      Height:        Constitutional: Reporting intermittent chest discomfort; no nausea, no vomiting.  Mild shortness of breath with activity. Vitals:   09/14/20 1000 09/14/20 1030 09/14/20 1100 09/14/20 1507  BP: 123/76 125/77 (!) 125/92 137/76  Pulse: 78 73 72 79  Resp:  18 (!) 22 20  Temp:    (!) 97.5 F (36.4 C)  TempSrc:    Oral  SpO2: 95% 92% 93% 94%  Weight:      Height:       Eyes: PERRL, lids and conjunctivae normal; no icterus, nystagmus. ENMT: Mucous membranes are moist. Posterior pharynx clear of any exudate or lesions. Neck: normal, supple, no masses, no thyromegaly; no JVD. Respiratory: Positive scattered rhonchi; fair air movement bilaterally.  No active wheezing. Cardiovascular: Regular rate and rhythm, no murmurs / rubs / gallops. No extremity edema. 2+ pedal pulses. No carotid bruits.  Abdomen: no tenderness, no masses palpated. No hepatosplenomegaly. Bowel sounds positive.  Musculoskeletal: no clubbing / cyanosis. No joint deformity upper and lower extremities. Good  ROM. Skin: no rashes, no petechiae. Neurologic: CN 2-12 grossly intact. Sensation intact, DTR normal. Strength 5/5 in all 4.  Psychiatric: Normal judgment and insight. Alert and oriented x 3. Normal mood.    Labs on Admission: I have personally reviewed following labs and imaging studies  CBC: Recent Labs  Lab 09/14/20 0825  WBC 11.0*  NEUTROABS 7.2  HGB 19.7*  HCT 57.6*  MCV 98.6  PLT 681    Basic Metabolic Panel: Recent Labs  Lab 09/14/20 0825  NA 134*  K 4.0  CL 106  CO2 18*  GLUCOSE 83  BUN 9  CREATININE 0.67  CALCIUM 8.6*    GFR: Estimated Creatinine Clearance: 85.3 mL/min (by C-G formula based on SCr of 0.67 mg/dL).  Liver Function Tests: Recent Labs  Lab 09/14/20 0825  AST 13*  ALT 16  ALKPHOS 55  BILITOT 1.2  PROT 7.1  ALBUMIN 3.5    Urine analysis:    Component Value Date/Time   COLORURINE YELLOW 09/14/2020 [place]  09/14/2020 0811   LABSPEC 1.024 09/14/2020 0811   PHURINE 5.0 09/14/2020 0811   GLUCOSEU NEGATIVE 09/14/2020 0811   HGBUR MODERATE (A) 09/14/2020 0811   BILIRUBINUR NEGATIVE 09/14/2020 0811   KETONESUR 20 (A) 09/14/2020 0811   PROTEINUR NEGATIVE 09/14/2020 0811   NITRITE NEGATIVE 09/14/2020 0811   LEUKOCYTESUR NEGATIVE 09/14/2020 0811    Radiological Exams on Admission: DG Lumbar Spine Complete  Result Date: 09/13/2020 CLINICAL DATA:  Localized lumbar pain after moving furniture. EXAM: LUMBAR SPINE - COMPLETE 4+ VIEW COMPARISON:  April 22, 2019 FINDINGS: Lumbar spinal alignment is maintained. Vertebral body heights are normal. No sign of fracture. Approximately 3 mm retrolisthesis of T12 on L1. Similar mild retrolisthesis of L1 on L2. Not definitely seen on previous examinations. Disc space narrowing, moderate at T12-L1, L1-L2 with mild disc space narrowing at L2-3, L3-4 and L4-5. Marked disc space narrowing and associated sclerosis of the endplates of L5 and S1. Facet arthropathy greatest at L4-5 and L5-S1.  IMPRESSION: 1. Multilevel degenerative disc disease greatest at L5-S1. 2. Mild retrolisthesis of T12 on L1 and L1 on L2, not seen on the previous exam and associated with some slight increase in disc space narrowing at these levels since the prior study from November of 2020. Electronically Signed   By: Zetta Bills M.D.   On: 09/13/2020 12:54   CT Angio Chest PE W and/or Wo Contrast  Result Date: 09/14/2020 CLINICAL DATA:  Right-sided chest pain and shortness of breath, high probability for PE EXAM: CT ANGIOGRAPHY CHEST WITH CONTRAST TECHNIQUE: Multidetector CT imaging of the chest was performed using the standard protocol during bolus administration of intravenous contrast. Multiplanar CT image reconstructions and MIPs were obtained to evaluate the vascular anatomy. CONTRAST:  175mL OMNIPAQUE IOHEXOL 350 MG/ML SOLN COMPARISON:  None. FINDINGS: Cardiovascular: Positive for central filling defect within segmental arteries of the right and left lower lobes. No evidence of right heart strain. Main pulmonary artery is normal in caliber. The aorta is normal in caliber. Scattered atherosclerotic vascular calcifications. Multifocal coronary artery calcifications. No pericardial effusion. Mediastinum/Nodes: Unremarkable CT appearance of the thyroid gland. No suspicious mediastinal or hilar adenopathy. No soft tissue mediastinal mass. Small hiatal hernia. Lungs/Pleura: Mild centrilobular pulmonary emphysema. Diffuse bilateral lower lobe bronchial wall thickening. Dependent atelectasis bilaterally. Mild ground-glass attenuation airspace opacity in the inferior right lower lobe in the region of the pulmonary embolus likely representing small pulmonary infarct/reperfusion injury. Trace right pleural effusion. Upper Abdomen: No acute abnormality. Musculoskeletal: No acute osseous abnormality. Review of the MIP images confirms the above findings. IMPRESSION: 1. Positive for acute bilateral lower lobe segmental pulmonary  emboli. 2. Mild ground-glass attenuation airspace opacity in the posteroinferior right lower lobe in the region of acute PE likely representing early pulmonary infarction/reperfusion injury. 3. Trace right pleural effusion. 4. Small hiatal hernia. 5. Aortic atherosclerosis and mild centrilobular pulmonary emphysema. 6. Diffuse bilateral lower lobe bronchial wall thickening most consistent with chronic bronchitis. Aortic Atherosclerosis (ICD10-I70.0) and Emphysema (ICD10-J43.9). Electronically Signed   By: Jacqulynn Cadet M.D.   On: 09/14/2020 10:22   DG Chest Port 1 View  Result Date: 09/14/2020 CLINICAL DATA:  Pain EXAM: PORTABLE CHEST 1 VIEW COMPARISON:  December 12, 2014 FINDINGS: There is atelectatic change in the left base. There is underlying interstitial thickening which is stable. No consolidation apparent. Heart is upper normal in size with pulmonary vascularity normal. There is aortic atherosclerosis. There is postoperative change in the lower cervical spine. There is midthoracic dextroscoliosis. IMPRESSION: Left base atelectasis.  No appreciable edema or consolidation. Underlying interstitial thickening may indicate changes of chronic bronchitis. Heart upper normal in size.  Aortic Atherosclerosis (ICD10-I70.0). Electronically Signed   By: Lowella Grip III M.D.   On: 09/14/2020 08:25    EKG: Independently reviewed.  No acute ischemic changes.  Sinus rhythm.  Assessment/Plan 1-chest pain/pulmonary embolism -Elevated D-dimer noncontributory PE on CT angiogram -Statin therapeutic dose of Lovenox -Will check 2D echo and lower extremity Dopplers -Provide analgesics as needed can follow clinical response. -Probably a good candidate to discharge home in the next 24-48 hours on home Eliquis. -Currently not requiring oxygen supplementation. -Patient is hemodynamically stable  2-HTN -continue avapro  3-tobacco abuse -Cessation counseling provided -Nicotine patch ordered.  4-alcohol  abuse -Patient denies withdrawal -Recently completed withdrawal behavior drinking to be wasted -Thiamine and folic acid will be provided -CIWA monitoring noted.  5-GI prophylaxis -PPI ordered.  6-Chronic back pain -continue PRN analgesics and zanaflex.   DVT prophylaxis: Lovenox Code Status:   Full Family Communication:  No family at bedside. Disposition Plan:   Patient is from:  Home   Anticipated DC to:  Home   Anticipated DC date:  09/15/20  Anticipated DC barriers: Complete work up and control pain.  Consults called:  None  Admission status:  Observation, length of stay less than 2 midnights, Telemetry bed.  Severity of Illness: Moderate illness, patient with acute PE, with ongoing Chest pain requiring IV analgesics and and at risk of decompensating.  Barton Dubois MD [facility]  How to contact the [facility] Attending or Consulting provider [place] or covering provider during after hours [place], for this patient?   1. Check the care team in [facility] and look for a) attending/consulting [facility] provider listed and b) the [facility] team listed 2. Log into [email or web address] and use [place]'s universal password to access. If you do not have the password, please contact the hospital operator. 3. Locate the [facility] provider you are looking for under [facility] and page to a number that you can be directly reached. 4. If you still have difficulty reaching the provider, please page the [facility] (Director on Call) for the Hospitalists listed on amion for assistance.  09/14/2020, 3:46 PM

## 2020-09-15 ENCOUNTER — Observation Stay (HOSPITAL_COMMUNITY): Payer: BC Managed Care – PPO

## 2020-09-15 DIAGNOSIS — E871 Hypo-osmolality and hyponatremia: Secondary | ICD-10-CM | POA: Diagnosis present

## 2020-09-15 DIAGNOSIS — J9601 Acute respiratory failure with hypoxia: Secondary | ICD-10-CM | POA: Diagnosis present

## 2020-09-15 DIAGNOSIS — R079 Chest pain, unspecified: Secondary | ICD-10-CM | POA: Diagnosis not present

## 2020-09-15 DIAGNOSIS — F1010 Alcohol abuse, uncomplicated: Secondary | ICD-10-CM | POA: Diagnosis present

## 2020-09-15 DIAGNOSIS — K219 Gastro-esophageal reflux disease without esophagitis: Secondary | ICD-10-CM | POA: Diagnosis present

## 2020-09-15 DIAGNOSIS — I2699 Other pulmonary embolism without acute cor pulmonale: Secondary | ICD-10-CM | POA: Diagnosis present

## 2020-09-15 DIAGNOSIS — K5900 Constipation, unspecified: Secondary | ICD-10-CM | POA: Diagnosis present

## 2020-09-15 DIAGNOSIS — G8929 Other chronic pain: Secondary | ICD-10-CM | POA: Diagnosis present

## 2020-09-15 DIAGNOSIS — I10 Essential (primary) hypertension: Secondary | ICD-10-CM | POA: Diagnosis present

## 2020-09-15 DIAGNOSIS — F17210 Nicotine dependence, cigarettes, uncomplicated: Secondary | ICD-10-CM | POA: Diagnosis present

## 2020-09-15 DIAGNOSIS — Z79899 Other long term (current) drug therapy: Secondary | ICD-10-CM | POA: Diagnosis not present

## 2020-09-15 DIAGNOSIS — Z20822 Contact with and (suspected) exposure to covid-19: Secondary | ICD-10-CM | POA: Diagnosis present

## 2020-09-15 LAB — CBC
HCT: 54.9 % — ABNORMAL HIGH (ref 39.0–52.0)
Hemoglobin: 18.6 g/dL — ABNORMAL HIGH (ref 13.0–17.0)
MCH: 33.1 pg (ref 26.0–34.0)
MCHC: 33.9 g/dL (ref 30.0–36.0)
MCV: 97.7 fL (ref 80.0–100.0)
Platelets: 159 10*3/uL (ref 150–400)
RBC: 5.62 MIL/uL (ref 4.22–5.81)
RDW: 12 % (ref 11.5–15.5)
WBC: 9.8 10*3/uL (ref 4.0–10.5)
nRBC: 0 % (ref 0.0–0.2)

## 2020-09-15 LAB — BASIC METABOLIC PANEL
Anion gap: 13 (ref 5–15)
BUN: 11 mg/dL (ref 8–23)
CO2: 18 mmol/L — ABNORMAL LOW (ref 22–32)
Calcium: 8.4 mg/dL — ABNORMAL LOW (ref 8.9–10.3)
Chloride: 96 mmol/L — ABNORMAL LOW (ref 98–111)
Creatinine, Ser: 0.74 mg/dL (ref 0.61–1.24)
GFR, Estimated: >60 mL/min (ref >60)
Glucose, Bld: 103 mg/dL — ABNORMAL HIGH (ref 70–99)
Potassium: 3.7 mmol/L (ref 3.5–5.1)
Sodium: 127 mmol/L — ABNORMAL LOW (ref 135–145)

## 2020-09-15 LAB — ECHOCARDIOGRAM COMPLETE
AR max vel: 2.27 cm2
AV Area VTI: 2.66 cm2
AV Area mean vel: 2.09 cm2
AV Mean grad: 2.6 mmHg
AV Peak grad: 4.8 mmHg
Ao pk vel: 1.09 m/s
Area-P 1/2: 4.1 cm2
Height: 66 in
S' Lateral: 2.72 cm
Weight: 2543.23 oz

## 2020-09-15 LAB — ANTITHROMBIN III: AntiThromb III Func: 69 % — ABNORMAL LOW (ref 75–120)

## 2020-09-15 MED ORDER — LORAZEPAM 1 MG PO TABS: 1.0000 mg | ORAL_TABLET | ORAL | Status: AC | PRN

## 2020-09-15 MED ORDER — ENOXAPARIN SODIUM 80 MG/0.8ML ~~LOC~~ SOLN
75.0000 mg | Freq: Two times a day (BID) | SUBCUTANEOUS | Status: DC
  Administered 2020-09-15 – 2020-09-17 (×7): 75 mg via SUBCUTANEOUS
  Filled 2020-09-15 (×7): qty 0.8

## 2020-09-15 MED ORDER — LORAZEPAM 2 MG/ML IJ SOLN
1.0000 mg | INTRAMUSCULAR | Status: AC | PRN
  Administered 2020-09-16: 2 mg via INTRAVENOUS
  Filled 2020-09-15: qty 1

## 2020-09-15 NOTE — Plan of Care (Signed)
Problem: Education: Goal: Knowledge of General Education information will improve Description: Including pain rating scale, medication(s)/side effects and non-pharmacologic comfort measures Outcome: Progressing   Problem: Health Behavior/Discharge Planning: Goal: Ability to manage health-related needs will improve Outcome: Progressing   Problem: Clinical Measurements: Goal: Ability to maintain clinical measurements within normal limits will improve Outcome: Progressing Goal: Will remain free from infection Outcome: Progressing Goal: Diagnostic test results will improve Outcome: Progressing Goal: Respiratory complications will improve Outcome: Progressing Goal: Cardiovascular complication will be avoided Outcome: Progressing   Problem: Activity: Goal: Risk for activity intolerance will decrease Outcome: Progressing   Problem: Nutrition: Goal: Adequate nutrition will be maintained Outcome: Progressing   Problem: Coping: Goal: Level of anxiety will decrease Outcome: Progressing   Problem: Elimination: Goal: Will not experience complications related to bowel motility Outcome: Progressing Goal: Will not experience complications related to urinary retention Outcome: Progressing   Problem: Pain Managment: Goal: General experience of comfort will improve Outcome: Progressing   Problem: Safety: Goal: Ability to remain free from injury will improve Outcome: Progressing   Problem: Skin Integrity: Goal: Risk for impaired skin integrity will decrease Outcome: Progressing   Problem: Consults Goal: Venous Thromboembolism Patient Education Description: See Patient Education Module for education specifics. Outcome: Progressing Goal: Diagnosis - Venous Thromboembolism (VTE) Description: Choose a selection Outcome: Progressing Goal: Pharmacy Consult for anticoagulation Outcome: Progressing Goal: Skin Care Protocol Initiated - if Braden Score 18 or less Description: If  consults are not indicated, leave blank or document N/A Outcome: Progressing Goal: Nutrition Consult-if indicated Outcome: Progressing Goal: Diabetes Guidelines if Diabetic/Glucose > 140 Description: If diabetic or lab glucose is > 140 mg/dl - Initiate Diabetes/Hyperglycemia Guidelines & Document Interventions  Outcome: Progressing   Problem: Phase I Progression Outcomes Goal: Pain controlled with appropriate interventions Outcome: Progressing Goal: Dyspnea controlled at rest (PE) Outcome: Progressing Goal: Tolerating diet Outcome: Progressing Goal: Initial discharge plan identified Outcome: Progressing Goal: Voiding-avoid urinary catheter unless indicated Outcome: Progressing Goal: Hemodynamically stable Outcome: Progressing Goal: Other Phase I Outcomes/Goals Outcome: Progressing   Problem: Phase II Progression Outcomes Goal: Therapeutic drug levels for anticoagulation Outcome: Progressing Goal: 02 sats trending upward/stable (PE) Outcome: Progressing Goal: Discharge plan established Outcome: Progressing Goal: Tolerating diet Outcome: Progressing Goal: Other Phase II Outcomes/Goals Outcome: Progressing   Problem: Phase III Progression Outcomes Goal: 02 sats stabilized Outcome: Progressing Goal: Activity at appropriate level-compared to baseline Description: (UP IN CHAIR FOR HEMODIALYSIS) Outcome: Progressing Goal: Discharge plan remains appropriate-arrangements made Outcome: Progressing Goal: Other Phase III Outcomes/Goals Outcome: Progressing   Problem: Discharge Progression Outcomes Goal: Barriers To Progression Addressed/Resolved Outcome: Progressing Goal: Discharge plan in place and appropriate Outcome: Progressing Goal: Pain controlled with appropriate interventions Outcome: Progressing Goal: Hemodynamically stable Outcome: Progressing Goal: Complications resolved/controlled Outcome: Progressing Goal: Tolerating diet Outcome: Progressing Goal:  Activity appropriate for discharge plan Outcome: Progressing Goal: Other Discharge Outcomes/Goals Outcome: Progressing   Problem: Education: Goal: Knowledge of General Education information will improve Description: Including pain rating scale, medication(s)/side effects and non-pharmacologic comfort measures Outcome: Progressing   Problem: Health Behavior/Discharge Planning: Goal: Ability to manage health-related needs will improve Outcome: Progressing   Problem: Clinical Measurements: Goal: Ability to maintain clinical measurements within normal limits will improve Outcome: Progressing Goal: Will remain free from infection Outcome: Progressing Goal: Diagnostic test results will improve Outcome: Progressing Goal: Respiratory complications will improve Outcome: Progressing Goal: Cardiovascular complication will be avoided Outcome: Progressing   Problem: Activity: Goal: Risk for activity intolerance will decrease Outcome: Progressing   Problem: Nutrition: Goal: Adequate  nutrition will be maintained Outcome: Progressing   Problem: Coping: Goal: Level of anxiety will decrease Outcome: Progressing   Problem: Elimination: Goal: Will not experience complications related to bowel motility Outcome: Progressing Goal: Will not experience complications related to urinary retention Outcome: Progressing   Problem: Pain Managment: Goal: General experience of comfort will improve Outcome: Progressing   Problem: Safety: Goal: Ability to remain free from injury will improve Outcome: Progressing   Problem: Skin Integrity: Goal: Risk for impaired skin integrity will decrease Outcome: Progressing

## 2020-09-15 NOTE — Progress Notes (Signed)
*  PRELIMINARY RESULTS* Echocardiogram 2D Echocardiogram has been performed.  Leavy Cella 09/15/2020, 10:19 AM

## 2020-09-15 NOTE — Progress Notes (Signed)
PROGRESS NOTE    Micheal Serrano  LHT:[id number] DOB: 05-24-1956 DOA: 09/14/2020 PCP: Celene Squibb, MD   Brief Narrative:   Micheal Serrano is a 65 y.o. male with medical history significant of alcohol abuse, gastroesophageal disease, prior history of hepatitis C (has completed treatment in the past for this condition), chronic lower back pain and hypertension; who presented to the hospital secondary to worsening pleuritic right-sided chest pain.  Patient was noted to have bilateral pulmonary emboli and has been started on therapeutic Lovenox.  2D echocardiogram with LVEF 60-65% and no DVTs noted.  He continues to require high amounts of oxygen.  Assessment & Plan:   Active Problems:   Pulmonary embolism (HCC)   PE (pulmonary thromboembolism) ([place])   Acute bilateral PE-unprovoked -May be related to tobacco abuse -Continue on Lovenox as ordered -2D echocardiogram with LVEF 60-65% and no signs of RV strain -Lower extremity Dopplers with no findings of DVT -Hypercoagulable panel with anticipated follow-up with hematology outpatient  Hypertension-stable -Continue Avapro  Tobacco abuse -Counseled on cessation  History of alcohol abuse -CIWA protocol  GI prophylaxis -PPI  Chronic back pain -Continue meds as ordered  DVT prophylaxis: Full dose Lovenox Code Status: Full Family Communication: Wife at bedside 4/6 Disposition Plan:  Status is: Inpatient  Remains inpatient appropriate because:Hemodynamically unstable, IV treatments appropriate due to intensity of illness or inability to take PO and Inpatient level of care appropriate due to severity of illness   Dispo: The patient is from: Home              Anticipated d/c is to: Home              Patient currently is not medically stable to d/c.   Difficult to place patient No  Consultants:   None  Procedures:   See below  Antimicrobials:   None   Subjective: Patient seen and evaluated today and states that  his chest pain is much improved today.  He denies any significant shortness of breath.  However, he is still requiring very high amounts of oxygen and is currently on 12 L high flow nasal cannula.  Objective: Vitals:   09/15/20 0550 09/15/20 0744 09/15/20 1106 09/15/20 1312  BP:  120/77  129/76  Pulse:  73  75  Resp:  16  18  Temp:  97.7 F (36.5 C)  97.6 F (36.4 C)  TempSrc:  Oral  Oral  SpO2: (!) 89% 91% 93% 90%  Weight:      Height:        Intake/Output Summary (Last 24 hours) at 09/15/2020 1315 Last data filed at 09/15/2020 1100 Gross per 24 hour  Intake 120 ml  Output 650 ml  Net -530 ml   Filed Weights   09/14/20 0804 09/14/20 1507  Weight: 72.6 kg 72.1 kg    Examination:  General exam: Appears calm and comfortable  Respiratory system: Clear to auscultation. Respiratory effort normal.  Currently on 12 L HFNC Cardiovascular system: S1 & S2 heard, RRR.  Gastrointestinal system: Abdomen is soft Central nervous system: Alert and awake Extremities: No edema Skin: No significant lesions noted Psychiatry: Flat affect.    Data Reviewed: I have personally reviewed following labs and imaging studies  CBC: Recent Labs  Lab 09/14/20 0825 09/15/20 1214  WBC 11.0* 9.8  NEUTROABS 7.2  --   HGB 19.7* 18.6*  HCT 57.6* 54.9*  MCV 98.6 97.7  PLT 151 572   Basic Metabolic Panel: Recent Labs  Lab 09/14/20 0825  NA 134*  K 4.0  CL 106  CO2 18*  GLUCOSE 83  BUN 9  CREATININE 0.67  CALCIUM 8.6*   GFR: Estimated Creatinine Clearance: 85.3 mL/min (by C-G formula based on SCr of 0.67 mg/dL). Liver Function Tests: Recent Labs  Lab 09/14/20 0825  AST 13*  ALT 16  ALKPHOS 55  BILITOT 1.2  PROT 7.1  ALBUMIN 3.5   Recent Labs  Lab 09/14/20 0825  LIPASE 21   No results for input(s): AMMONIA in the last 168 hours. Coagulation Profile: No results for input(s): INR, PROTIME in the last 168 hours. Cardiac Enzymes: No results for input(s): CKTOTAL, CKMB,  CKMBINDEX, TROPONINI in the last 168 hours. BNP (last 3 results) No results for input(s): PROBNP in the last 8760 hours. HbA1C: No results for input(s): HGBA1C in the last 72 hours. CBG: No results for input(s): GLUCAP in the last 168 hours. Lipid Profile: No results for input(s): CHOL, HDL, LDLCALC, TRIG, CHOLHDL, LDLDIRECT in the last 72 hours. Thyroid Function Tests: No results for input(s): TSH, T4TOTAL, FREET4, T3FREE, THYROIDAB in the last 72 hours. Anemia Panel: No results for input(s): VITAMINB12, FOLATE, FERRITIN, TIBC, IRON, RETICCTPCT in the last 72 hours. Sepsis Labs: No results for input(s): PROCALCITON, LATICACIDVEN in the last 168 hours.  Recent Results (from the past 240 hour(s))  Resp Panel by RT-PCR (Flu A&B, Covid) Nasopharyngeal Swab     Status: None   Collection Time: 09/14/20 11:31 AM   Specimen: Nasopharyngeal Swab; Nasopharyngeal(NP) swabs in vial transport medium  Result Value Ref Range Status   SARS Coronavirus 2 by RT PCR NEGATIVE NEGATIVE Final    Comment: (NOTE) SARS-CoV-2 target nucleic acids are NOT DETECTED.  The SARS-CoV-2 RNA is generally detectable in upper respiratory specimens during the acute phase of infection. The lowest concentration of SARS-CoV-2 viral copies this assay can detect is 138 copies/mL. A negative result does not preclude SARS-Cov-2 infection and should not be used as the sole basis for treatment or other patient management decisions. A negative result may occur with  improper specimen collection/handling, submission of specimen other than nasopharyngeal swab, presence of viral mutation(s) within the areas targeted by this assay, and inadequate number of viral copies(<138 copies/mL). A negative result must be combined with clinical observations, patient history, and epidemiological information. The expected result is Negative.  Fact Sheet for Patients:  EntrepreneurPulse.com.au  Fact Sheet for Healthcare  Providers:  IncredibleEmployment.be  This test is no t yet approved or cleared by the [place] FDA and  has been authorized for detection and/or diagnosis of SARS-CoV-2 by FDA under an Emergency Use Authorization (EUA). This EUA will remain  in effect (meaning this test can be used) for the duration of the COVID-19 declaration under Section 564(b)(1) of the Act, 21 U.S.C.section 360bbb-3(b)(1), unless the authorization is terminated  or revoked sooner.       Influenza A by PCR NEGATIVE NEGATIVE Final   Influenza B by PCR NEGATIVE NEGATIVE Final    Comment: (NOTE) The Xpert Xpress SARS-CoV-2/FLU/RSV plus assay is intended as an aid in the diagnosis of influenza from Nasopharyngeal swab specimens and should not be used as a sole basis for treatment. Nasal washings and aspirates are unacceptable for Xpert Xpress SARS-CoV-2/FLU/RSV testing.  Fact Sheet for Patients: EntrepreneurPulse.com.au  Fact Sheet for Healthcare Providers: IncredibleEmployment.be  This test is not yet approved or cleared by the [place] FDA and has been authorized for detection and/or diagnosis of SARS-CoV-2 by FDA under  an Emergency Use Authorization (EUA). This EUA will remain in effect (meaning this test can be used) for the duration of the COVID-19 declaration under Section 564(b)(1) of the Act, 21 U.S.C. section 360bbb-3(b)(1), unless the authorization is terminated or revoked.  Performed at [facility], [address], [place], [place] [address]          Radiology Studies: CT Angio Chest PE W and/or Wo Contrast  Result Date: 09/14/2020 CLINICAL DATA:  Right-sided chest pain and shortness of breath, high probability for PE EXAM: CT ANGIOGRAPHY CHEST WITH CONTRAST TECHNIQUE: Multidetector CT imaging of the chest was performed using the standard protocol during bolus administration of intravenous contrast. Multiplanar CT image  reconstructions and MIPs were obtained to evaluate the vascular anatomy. CONTRAST:  158mL OMNIPAQUE IOHEXOL 350 MG/ML SOLN COMPARISON:  None. FINDINGS: Cardiovascular: Positive for central filling defect within segmental arteries of the right and left lower lobes. No evidence of right heart strain. Main pulmonary artery is normal in caliber. The aorta is normal in caliber. Scattered atherosclerotic vascular calcifications. Multifocal coronary artery calcifications. No pericardial effusion. Mediastinum/Nodes: Unremarkable CT appearance of the thyroid gland. No suspicious mediastinal or hilar adenopathy. No soft tissue mediastinal mass. Small hiatal hernia. Lungs/Pleura: Mild centrilobular pulmonary emphysema. Diffuse bilateral lower lobe bronchial wall thickening. Dependent atelectasis bilaterally. Mild ground-glass attenuation airspace opacity in the inferior right lower lobe in the region of the pulmonary embolus likely representing small pulmonary infarct/reperfusion injury. Trace right pleural effusion. Upper Abdomen: No acute abnormality. Musculoskeletal: No acute osseous abnormality. Review of the MIP images confirms the above findings. IMPRESSION: 1. Positive for acute bilateral lower lobe segmental pulmonary emboli. 2. Mild ground-glass attenuation airspace opacity in the posteroinferior right lower lobe in the region of acute PE likely representing early pulmonary infarction/reperfusion injury. 3. Trace right pleural effusion. 4. Small hiatal hernia. 5. Aortic atherosclerosis and mild centrilobular pulmonary emphysema. 6. Diffuse bilateral lower lobe bronchial wall thickening most consistent with chronic bronchitis. Aortic Atherosclerosis (ICD10-I70.0) and Emphysema (ICD10-J43.9). Electronically Signed   By: Jacqulynn Cadet M.D.   On: 09/14/2020 10:22   US Venous Img Lower Bilateral (DVT)  Result Date: 09/15/2020 CLINICAL DATA:  Shortness of breath EXAM: BILATERAL LOWER EXTREMITY VENOUS DOPPLER  ULTRASOUND TECHNIQUE: Gray-scale sonography with graded compression, as well as color Doppler and duplex ultrasound were performed to evaluate the lower extremity deep venous systems from the level of the common femoral vein and including the common femoral, femoral, profunda femoral, popliteal and calf veins including the posterior tibial, peroneal and gastrocnemius veins when visible. The superficial great saphenous vein was also interrogated. Spectral Doppler was utilized to evaluate flow at rest and with distal augmentation maneuvers in the common femoral, femoral and popliteal veins. COMPARISON:  None. FINDINGS: RIGHT LOWER EXTREMITY Common Femoral Vein: No evidence of thrombus. Normal compressibility, respiratory phasicity and response to augmentation. Saphenofemoral Junction: No evidence of thrombus. Normal compressibility and flow on color Doppler imaging. Profunda Femoral Vein: No evidence of thrombus. Normal compressibility and flow on color Doppler imaging. Femoral Vein: No evidence of thrombus. Normal compressibility, respiratory phasicity and response to augmentation. Popliteal Vein: No evidence of thrombus. Normal compressibility, respiratory phasicity and response to augmentation. Calf Veins: No evidence of thrombus. Normal compressibility and flow on color Doppler imaging. Superficial Great Saphenous Vein: No evidence of thrombus. Normal compressibility. Venous Reflux:  None. Other Findings:  None. LEFT LOWER EXTREMITY Common Femoral Vein: No evidence of thrombus. Normal compressibility, respiratory phasicity and response to augmentation. Saphenofemoral Junction: No evidence of  thrombus. Normal compressibility and flow on color Doppler imaging. Profunda Femoral Vein: No evidence of thrombus. Normal compressibility and flow on color Doppler imaging. Femoral Vein: No evidence of thrombus. Normal compressibility, respiratory phasicity and response to augmentation. Popliteal Vein: No evidence of  thrombus. Normal compressibility, respiratory phasicity and response to augmentation. Calf Veins: No evidence of thrombus. Normal compressibility and flow on color Doppler imaging. Superficial Great Saphenous Vein: No evidence of thrombus. Normal compressibility. Venous Reflux:  None. Other Findings:  None. IMPRESSION: No evidence of deep venous thrombosis. Electronically Signed   By: Jacqulynn Cadet M.D.   On: 09/15/2020 11:07   DG Chest Port 1 View  Result Date: 09/14/2020 CLINICAL DATA:  Pain EXAM: PORTABLE CHEST 1 VIEW COMPARISON:  December 12, 2014 FINDINGS: There is atelectatic change in the left base. There is underlying interstitial thickening which is stable. No consolidation apparent. Heart is upper normal in size with pulmonary vascularity normal. There is aortic atherosclerosis. There is postoperative change in the lower cervical spine. There is midthoracic dextroscoliosis. IMPRESSION: Left base atelectasis. No appreciable edema or consolidation. Underlying interstitial thickening may indicate changes of chronic bronchitis. Heart upper normal in size.  Aortic Atherosclerosis (ICD10-I70.0). Electronically Signed   By: Lowella Grip III M.D.   On: 09/14/2020 08:25   ECHOCARDIOGRAM COMPLETE  Result Date: 09/15/2020    ECHOCARDIOGRAM REPORT   Patient Name:   JONATHAN CORPUS Date of Exam: 09/15/2020 Medical Rec #:  [id number]       Height:       66.0 in Accession #:    [id number]      Weight:       159.0 lb Date of Birth:  Oct 13, 1955      BSA:          1.814 m Patient Age:    72 years        BP:           120/77 mmHg Patient Gender: M               HR:           73 bpm. Exam Location:  [facility] Procedure: 2D Echo Indications:    Chest Pain R07.9  History:        Patient has prior history of Echocardiogram examinations, most                 recent 12/13/2014. Signs/Symptoms:Chest Pain; Risk Factors:Current                 Smoker and Hypertension. Pulmonary Embolism, ETOH, GERD.  Sonographer:    Leavy Cella RDCS (AE) Referring Phys: [place]  1. Left ventricular ejection fraction, by estimation, is 60 to 65%. The left ventricle has normal function. The left ventricle has no regional wall motion abnormalities. Left ventricular diastolic parameters are indeterminate.  2. Right ventricular systolic function is normal. The right ventricular size is normal.  3. The mitral valve is normal in structure. No evidence of mitral valve regurgitation. No evidence of mitral stenosis.  4. The aortic valve is tricuspid. Aortic valve regurgitation is not visualized. No aortic stenosis is present.  5. The inferior vena cava is normal in size with greater than 50% respiratory variability, suggesting right atrial pressure of 3 mmHg. FINDINGS  Left Ventricle: Left ventricular ejection fraction, by estimation, is 60 to 65%. The left ventricle has normal function. The left ventricle has no regional wall motion abnormalities. The left ventricular internal cavity  size was normal in size. There is  no left ventricular hypertrophy. Left ventricular diastolic parameters are indeterminate. Right Ventricle: The right ventricular size is normal. No increase in right ventricular wall thickness. Right ventricular systolic function is normal. Left Atrium: Left atrial size was normal in size. Right Atrium: Right atrial size was normal in size. Pericardium: There is no evidence of pericardial effusion. Mitral Valve: The mitral valve is normal in structure. No evidence of mitral valve regurgitation. No evidence of mitral valve stenosis. Tricuspid Valve: The tricuspid valve is normal in structure. Tricuspid valve regurgitation is not demonstrated. No evidence of tricuspid stenosis. Aortic Valve: The aortic valve is tricuspid. Aortic valve regurgitation is not visualized. No aortic stenosis is present. Aortic valve mean gradient measures 2.6 mmHg. Aortic valve peak gradient measures 4.8 mmHg. Aortic valve area, by VTI measures  2.66 cm. Pulmonic Valve: The pulmonic valve was not well visualized. Pulmonic valve regurgitation is not visualized. No evidence of pulmonic stenosis. Aorta: The aortic root is normal in size and structure. Pulmonary Artery: Indeterminant PASP, inadequate TR jet. Venous: The inferior vena cava is normal in size with greater than 50% respiratory variability, suggesting right atrial pressure of 3 mmHg. IAS/Shunts: No atrial level shunt detected by color flow Doppler.  LEFT VENTRICLE PLAX 2D LVIDd:         4.25 cm  Diastology LVIDs:         2.72 cm  LV e' medial:    5.44 cm/s LV PW:         0.90 cm  LV E/e' medial:  10.3 LV IVS:        0.90 cm  LV e' lateral:   8.70 cm/s LVOT diam:     2.10 cm  LV E/e' lateral: 6.5 LV SV:         56 LV SV Index:   31 LVOT Area:     3.46 cm  RIGHT VENTRICLE RV S prime:     19.40 cm/s TAPSE (M-mode): 2.4 cm LEFT ATRIUM             Index       RIGHT ATRIUM           Index LA diam:        3.90 cm 2.15 cm/m  RA Area:     17.30 cm LA Vol (A2C):   58.7 ml 32.36 ml/m RA Volume:   44.60 ml  24.59 ml/m LA Vol (A4C):   28.2 ml 15.55 ml/m LA Biplane Vol: 42.5 ml 23.43 ml/m  AORTIC VALVE AV Area (Vmax):    2.27 cm AV Area (Vmean):   2.09 cm AV Area (VTI):     2.66 cm AV Vmax:           109.37 cm/s AV Vmean:          76.931 cm/s AV VTI:            0.209 m AV Peak Grad:      4.8 mmHg AV Mean Grad:      2.6 mmHg LVOT Vmax:         71.65 cm/s LVOT Vmean:        46.508 cm/s LVOT VTI:          0.160 m LVOT/AV VTI ratio: 0.77  AORTA Ao Root diam: 3.60 cm MITRAL VALVE MV Area (PHT): 4.10 cm    SHUNTS MV Decel Time: 185 msec    Systemic VTI:  0.16 m MV E velocity: 56.30 cm/s  Systemic Diam: 2.10 cm MV A velocity: 37.20 cm/s MV E/A ratio:  1.51 Carlyle Dolly MD Electronically signed by Carlyle Dolly MD Signature Date/Time: 09/15/2020/11:28:59 AM    Final         Scheduled Meds: . enoxaparin (LOVENOX) injection  75 mg Subcutaneous BID  . folic acid  1 mg Oral Daily  . irbesartan  300  mg Oral Daily  . nicotine  21 mg Transdermal Daily  . pantoprazole  40 mg Oral Daily  . thiamine  100 mg Oral Daily    LOS: 0 days    Time spent: 35 minutes    Diamantina Edinger Darleen [place], DO [facility]  If 7PM-7AM, please contact night-coverage [email or web address] 09/15/2020, 1:15 PM

## 2020-09-15 NOTE — TOC Initial Note (Signed)
Transition of Care [facility]) - Initial/Assessment Note    Patient Details  Name: Micheal Serrano MRN: [id number] Date of Birth: [birth year]  Transition of Care [facility] CM/SW Contact:    Salome Arnt, Raymond Phone Number: 09/15/2020, 8:12 AM  Clinical Narrative: Pt admitted due to chest pain/pulmonary embolism. TOC consulted for substance abuse. Pt reports he lives with his wife and works full time as an Hotel manager. He admits to drinking several shots of [employer] daily. Pt does not feel his substance use is a problem for him, but indicates MD said he needed to stop. He has plans to stop drinking and said he was sober from 2005-2009 and quit on his own then. Pt declines substance abuse resources. TOC will continue to follow.                    Expected Discharge Plan: Home/Self Care Barriers to Discharge: Continued Medical Work up   Patient Goals and CMS Choice Patient states their goals for this hospitalization and ongoing recovery are:: return home and to work   Choice offered to / list presented to : Patient  Expected Discharge Plan and Services Expected Discharge Plan: Home/Self Care In-house Referral: Clinical Social Work     Living arrangements for the past 2 months: Single Family Home                 DME Arranged: N/A DME Agency: NA                  Prior Living Arrangements/Services Living arrangements for the past 2 months: Single Family Home Lives with:: Spouse Patient language and need for interpreter reviewed:: Yes Do you feel safe going back to the place where you live?: Yes      Need for Family Participation in Patient Care: No (Comment)     Criminal Activity/Legal Involvement Pertinent to Current Situation/Hospitalization: No - Comment as needed  Activities of Daily Living Home Assistive Devices/Equipment: None ADL Screening (condition at time of admission) Patient's cognitive ability adequate to safely complete daily activities?: Yes Is  the patient deaf or have difficulty hearing?: No Does the patient have difficulty seeing, even when wearing glasses/contacts?: No Does the patient have difficulty concentrating, remembering, or making decisions?: No Patient able to express need for assistance with ADLs?: Yes Does the patient have difficulty dressing or bathing?: No Independently performs ADLs?: Yes (appropriate for developmental age) Does the patient have difficulty walking or climbing stairs?: No Weakness of Legs: None Weakness of Arms/Hands: None  Permission Sought/Granted                  Emotional Assessment   Attitude/Demeanor/Rapport: Engaged Affect (typically observed): Accepting Orientation: : Oriented to Place,Oriented to  Time,Oriented to Situation,Oriented to Self Alcohol / Substance Use: Alcohol Use Psych Involvement: No (comment)  Admission diagnosis:  Pulmonary embolism (HCC) [I26.99] Acute septic pulmonary embolism without acute cor pulmonale ([place]) [I26.90] Patient Active Problem List   Diagnosis Date Noted  . Pulmonary embolism ([place]) 09/14/2020  . Alcohol abuse   . Pain in the chest   . Esophageal reflux   . Chest pain 12/12/2014  . HNP (herniated nucleus pulposus), lumbar 06/03/2014  . Hepatitis, chronic active ([place]) 01/06/2013  . Hypokalemia 02/28/2011  . Tobacco abuse 02/28/2011  . GERD (gastroesophageal reflux disease) 02/28/2011  . Transaminitis 02/28/2011  . IFG (impaired fasting glucose) 02/28/2011  . Chest pain 02/27/2011  . HTN (hypertension) 02/27/2011   PCP:  Allyn Kenner  Z, MD Pharmacy:   [facility], [place] [address] Phone: [phone] Fax: [phone]  [facility], [place] [address] Phone: [phone] Fax: [phone]  [facility] [place], [place] [address] Phone:  [phone] Fax: [place], [place] [place] [address] Phone: [phone] Fax: [phone]     Social Determinants of Health (SDOH) Interventions    Readmission Risk Interventions No flowsheet data found.

## 2020-09-15 NOTE — Progress Notes (Signed)
MD notified to call RN due to presentation of patient. Upon entering the room the patient look flushed and diaphoretic the patient was complaining about severe right flank pain. He also seemed restless and SOB. Vitals signs were obtained and Dilaudid was admin. Patient was on 2L of oxygen once entering the room. Patient had to placed on 5L then  high flow nasal cannula at 8L. Resp was called to the room during this time. After talking to MD and respiratory therapist it was decided that patient would stay on med-tele floor. Patient was also ordered to be on continuous pulse ox. Vital signs are as followed 150/85 HR 83 87 on 5L .Patients oxygen level went up to 91% on 8L HFNC. Will continue to monitor.

## 2020-09-16 LAB — CBC
HCT: 53 % — ABNORMAL HIGH (ref 39.0–52.0)
Hemoglobin: 17.8 g/dL — ABNORMAL HIGH (ref 13.0–17.0)
MCH: 32.7 pg (ref 26.0–34.0)
MCHC: 33.6 g/dL (ref 30.0–36.0)
MCV: 97.4 fL (ref 80.0–100.0)
Platelets: 159 10*3/uL (ref 150–400)
RBC: 5.44 MIL/uL (ref 4.22–5.81)
RDW: 11.7 % (ref 11.5–15.5)
WBC: 9.2 10*3/uL (ref 4.0–10.5)
nRBC: 0 % (ref 0.0–0.2)

## 2020-09-16 LAB — BASIC METABOLIC PANEL
Anion gap: 12 (ref 5–15)
BUN: 8 mg/dL (ref 8–23)
CO2: 20 mmol/L — ABNORMAL LOW (ref 22–32)
Calcium: 8.7 mg/dL — ABNORMAL LOW (ref 8.9–10.3)
Chloride: 98 mmol/L (ref 98–111)
Creatinine, Ser: 0.65 mg/dL (ref 0.61–1.24)
GFR, Estimated: >60 mL/min (ref >60)
Glucose, Bld: 90 mg/dL (ref 70–99)
Potassium: 3.7 mmol/L (ref 3.5–5.1)
Sodium: 130 mmol/L — ABNORMAL LOW (ref 135–145)

## 2020-09-16 LAB — OSMOLALITY: Osmolality: 270 mOsm/kg — ABNORMAL LOW (ref 275–295)

## 2020-09-16 LAB — TSH: TSH: 1.668 u[IU]/mL (ref 0.350–4.500)

## 2020-09-16 LAB — OSMOLALITY, URINE: Osmolality, Ur: 125 mOsm/kg — ABNORMAL LOW (ref 300–900)

## 2020-09-16 LAB — MAGNESIUM: Magnesium: 1.6 mg/dL — ABNORMAL LOW (ref 1.7–2.4)

## 2020-09-16 LAB — HOMOCYSTEINE: Homocysteine: 12.9 umol/L (ref 0.0–17.2)

## 2020-09-16 MED ORDER — SODIUM CHLORIDE 0.9 % IV SOLN: INTRAVENOUS | Status: AC

## 2020-09-16 MED ORDER — MAGNESIUM SULFATE 2 GM/50ML IV SOLN
2.0000 g | Freq: Once | INTRAVENOUS | Status: AC
  Administered 2020-09-16: 2 g via INTRAVENOUS
  Filled 2020-09-16: qty 50

## 2020-09-16 NOTE — Progress Notes (Signed)
PROGRESS NOTE    Micheal Serrano  NGE:[id number] DOB: Jan 21, 1956 DOA: 09/14/2020 PCP: Celene Squibb, MD   Brief Narrative:   Micheal Serrano a 65 y.o.malewith medical history significant ofalcohol abuse, gastroesophageal disease, prior history of hepatitis C (has completed treatment in the past for this condition), chronic lower back pain and hypertension; who presented to the hospital secondary to worsening pleuritic right-sided chest pain.  Patient was noted to have bilateral pulmonary emboli and has been started on therapeutic Lovenox.  2D echocardiogram with LVEF 60-65% and no DVTs noted.  He continues to require high amounts of oxygen.  Assessment & Plan:   Active Problems:   Pulmonary embolism (HCC)   PE (pulmonary thromboembolism) ([place])   Acute bilateral PE-unprovoked -May be related to tobacco abuse -Continue on Lovenox as ordered -2D echocardiogram with LVEF 60-65% and no signs of RV strain -Lower extremity Dopplers with no findings of DVT -Hypercoagulable panel with anticipated follow-up with hematology outpatient -Encouraged ambulation and incentive spirometry  Hypertension-stable -Continue Avapro  Hyponatremia -Full evaluation pending -May be chronic and related to noncompliance -Time-limited normal saline -Continue to follow labs  Hypomagnesemia -Replete IV and reevaluate  Tobacco abuse -Counseled on cessation -Refused nicotine patch  History of alcohol abuse -CIWA protocol  GI prophylaxis -PPI  Chronic back pain -Continue meds as ordered  DVT prophylaxis: Full dose Lovenox Code Status: Full Family Communication: Wife at bedside 4/7 Disposition Plan:  Status is: Inpatient  Remains inpatient appropriate because:Hemodynamically unstable, IV treatments appropriate due to intensity of illness or inability to take PO and Inpatient level of care appropriate due to severity of illness   Dispo: The patient is from: Home   Anticipated d/c is to: Home  Patient currently is not medically stable to d/c.              Difficult to place patient No  Consultants:   None  Procedures:   See below  Antimicrobials:   None  Subjective: Patient seen and evaluated today with no new acute complaints or concerns. No acute concerns or events noted overnight.  He seems more sleepy and irritable today.  Objective: Vitals:   09/15/20 2028 09/15/20 2045 09/16/20 0231 09/16/20 0352  BP:  135/83  118/63  Pulse:  78  72  Resp:  18  20  Temp:  97.9 F (36.6 C)  (!) 97.3 F (36.3 C)  TempSrc:      SpO2: 93% (!) 88% (!) 83% 91%  Weight:      Height:        Intake/Output Summary (Last 24 hours) at 09/16/2020 1108 Last data filed at 09/15/2020 1700 Gross per 24 hour  Intake 480 ml  Output 300 ml  Net 180 ml   Filed Weights   09/14/20 0804 09/14/20 1507  Weight: 72.6 kg 72.1 kg    Examination:  General exam: Appears calm and comfortable  Respiratory system: Clear to auscultation. Respiratory effort normal.  Currently on 9 L high flow nasal cannula Cardiovascular system: S1 & S2 heard, RRR.  Gastrointestinal system: Abdomen is soft Central nervous system: Alert and awake Extremities: No edema Skin: No significant lesions noted Psychiatry: Flat affect.    Data Reviewed: I have personally reviewed following labs and imaging studies  CBC: Recent Labs  Lab 09/14/20 0825 09/15/20 1214 09/16/20 0555  WBC 11.0* 9.8 9.2  NEUTROABS 7.2  --   --   HGB 19.7* 18.6* 17.8*  HCT 57.6* 54.9* 53.0*  MCV 98.6 97.7 97.4  PLT 151 159 160   Basic Metabolic Panel: Recent Labs  Lab 09/14/20 0825 09/15/20 1214 09/16/20 0555  NA 134* 127* 130*  K 4.0 3.7 3.7  CL 106 96* 98  CO2 18* 18* 20*  GLUCOSE 83 103* 90  BUN 9 11 8   CREATININE 0.67 0.74 0.65  CALCIUM 8.6* 8.4* 8.7*  MG  --   --  1.6*   GFR: Estimated Creatinine Clearance: 85.3 mL/min (by C-G formula based on SCr of 0.65  mg/dL). Liver Function Tests: Recent Labs  Lab 09/14/20 0825  AST 13*  ALT 16  ALKPHOS 55  BILITOT 1.2  PROT 7.1  ALBUMIN 3.5   Recent Labs  Lab 09/14/20 0825  LIPASE 21   No results for input(s): AMMONIA in the last 168 hours. Coagulation Profile: No results for input(s): INR, PROTIME in the last 168 hours. Cardiac Enzymes: No results for input(s): CKTOTAL, CKMB, CKMBINDEX, TROPONINI in the last 168 hours. BNP (last 3 results) No results for input(s): PROBNP in the last 8760 hours. HbA1C: No results for input(s): HGBA1C in the last 72 hours. CBG: No results for input(s): GLUCAP in the last 168 hours. Lipid Profile: No results for input(s): CHOL, HDL, LDLCALC, TRIG, CHOLHDL, LDLDIRECT in the last 72 hours. Thyroid Function Tests: No results for input(s): TSH, T4TOTAL, FREET4, T3FREE, THYROIDAB in the last 72 hours. Anemia Panel: No results for input(s): VITAMINB12, FOLATE, FERRITIN, TIBC, IRON, RETICCTPCT in the last 72 hours. Sepsis Labs: No results for input(s): PROCALCITON, LATICACIDVEN in the last 168 hours.  Recent Results (from the past 240 hour(s))  Resp Panel by RT-PCR (Flu A&B, Covid) Nasopharyngeal Swab     Status: None   Collection Time: 09/14/20 11:31 AM   Specimen: Nasopharyngeal Swab; Nasopharyngeal(NP) swabs in vial transport medium  Result Value Ref Range Status   SARS Coronavirus 2 by RT PCR NEGATIVE NEGATIVE Final    Comment: (NOTE) SARS-CoV-2 target nucleic acids are NOT DETECTED.  The SARS-CoV-2 RNA is generally detectable in upper respiratory specimens during the acute phase of infection. The lowest concentration of SARS-CoV-2 viral copies this assay can detect is 138 copies/mL. A negative result does not preclude SARS-Cov-2 infection and should not be used as the sole basis for treatment or other patient management decisions. A negative result may occur with  improper specimen collection/handling, submission of specimen other than  nasopharyngeal swab, presence of viral mutation(s) within the areas targeted by this assay, and inadequate number of viral copies(<138 copies/mL). A negative result must be combined with clinical observations, patient history, and epidemiological information. The expected result is Negative.  Fact Sheet for Patients:  EntrepreneurPulse.com.au  Fact Sheet for Healthcare Providers:  IncredibleEmployment.be  This test is no t yet approved or cleared by the [place] FDA and  has been authorized for detection and/or diagnosis of SARS-CoV-2 by FDA under an Emergency Use Authorization (EUA). This EUA will remain  in effect (meaning this test can be used) for the duration of the COVID-19 declaration under Section 564(b)(1) of the Act, 21 U.S.C.section 360bbb-3(b)(1), unless the authorization is terminated  or revoked sooner.       Influenza A by PCR NEGATIVE NEGATIVE Final   Influenza B by PCR NEGATIVE NEGATIVE Final    Comment: (NOTE) The Xpert Xpress SARS-CoV-2/FLU/RSV plus assay is intended as an aid in the diagnosis of influenza from Nasopharyngeal swab specimens and should not be used as a sole basis for treatment. Nasal washings and aspirates are unacceptable for Xpert Xpress SARS-CoV-2/FLU/RSV  testing.  Fact Sheet for Patients: EntrepreneurPulse.com.au  Fact Sheet for Healthcare Providers: IncredibleEmployment.be  This test is not yet approved or cleared by the [place] FDA and has been authorized for detection and/or diagnosis of SARS-CoV-2 by FDA under an Emergency Use Authorization (EUA). This EUA will remain in effect (meaning this test can be used) for the duration of the COVID-19 declaration under Section 564(b)(1) of the Act, 21 U.S.C. section 360bbb-3(b)(1), unless the authorization is terminated or revoked.  Performed at [facility], [address], [place], [place] [address]           Radiology Studies: US Venous Img Lower Bilateral (DVT)  Result Date: 09/15/2020 CLINICAL DATA:  Shortness of breath EXAM: BILATERAL LOWER EXTREMITY VENOUS DOPPLER ULTRASOUND TECHNIQUE: Gray-scale sonography with graded compression, as well as color Doppler and duplex ultrasound were performed to evaluate the lower extremity deep venous systems from the level of the common femoral vein and including the common femoral, femoral, profunda femoral, popliteal and calf veins including the posterior tibial, peroneal and gastrocnemius veins when visible. The superficial great saphenous vein was also interrogated. Spectral Doppler was utilized to evaluate flow at rest and with distal augmentation maneuvers in the common femoral, femoral and popliteal veins. COMPARISON:  None. FINDINGS: RIGHT LOWER EXTREMITY Common Femoral Vein: No evidence of thrombus. Normal compressibility, respiratory phasicity and response to augmentation. Saphenofemoral Junction: No evidence of thrombus. Normal compressibility and flow on color Doppler imaging. Profunda Femoral Vein: No evidence of thrombus. Normal compressibility and flow on color Doppler imaging. Femoral Vein: No evidence of thrombus. Normal compressibility, respiratory phasicity and response to augmentation. Popliteal Vein: No evidence of thrombus. Normal compressibility, respiratory phasicity and response to augmentation. Calf Veins: No evidence of thrombus. Normal compressibility and flow on color Doppler imaging. Superficial Great Saphenous Vein: No evidence of thrombus. Normal compressibility. Venous Reflux:  None. Other Findings:  None. LEFT LOWER EXTREMITY Common Femoral Vein: No evidence of thrombus. Normal compressibility, respiratory phasicity and response to augmentation. Saphenofemoral Junction: No evidence of thrombus. Normal compressibility and flow on color Doppler imaging. Profunda Femoral Vein: No evidence of thrombus. Normal compressibility and flow on  color Doppler imaging. Femoral Vein: No evidence of thrombus. Normal compressibility, respiratory phasicity and response to augmentation. Popliteal Vein: No evidence of thrombus. Normal compressibility, respiratory phasicity and response to augmentation. Calf Veins: No evidence of thrombus. Normal compressibility and flow on color Doppler imaging. Superficial Great Saphenous Vein: No evidence of thrombus. Normal compressibility. Venous Reflux:  None. Other Findings:  None. IMPRESSION: No evidence of deep venous thrombosis. Electronically Signed   By: Jacqulynn Cadet M.D.   On: 09/15/2020 11:07   ECHOCARDIOGRAM COMPLETE  Result Date: 09/15/2020    ECHOCARDIOGRAM REPORT   Patient Name:   Micheal Serrano Date of Exam: 09/15/2020 Medical Rec #:  [id number]       Height:       66.0 in Accession #:    [id number]      Weight:       159.0 lb Date of Birth:  1956/05/06      BSA:          1.814 m Patient Age:    89 years        BP:           120/77 mmHg Patient Gender: M               HR:           73 bpm. Exam Location:  [facility] Procedure: 2D Echo Indications:    Chest Pain R07.9  History:        Patient has prior history of Echocardiogram examinations, most                 recent 12/13/2014. Signs/Symptoms:Chest Pain; Risk Factors:Current                 Smoker and Hypertension. Pulmonary Embolism, ETOH, GERD.  Sonographer:    Leavy Cella RDCS (AE) Referring Phys: [place]  1. Left ventricular ejection fraction, by estimation, is 60 to 65%. The left ventricle has normal function. The left ventricle has no regional wall motion abnormalities. Left ventricular diastolic parameters are indeterminate.  2. Right ventricular systolic function is normal. The right ventricular size is normal.  3. The mitral valve is normal in structure. No evidence of mitral valve regurgitation. No evidence of mitral stenosis.  4. The aortic valve is tricuspid. Aortic valve regurgitation is not visualized. No aortic  stenosis is present.  5. The inferior vena cava is normal in size with greater than 50% respiratory variability, suggesting right atrial pressure of 3 mmHg. FINDINGS  Left Ventricle: Left ventricular ejection fraction, by estimation, is 60 to 65%. The left ventricle has normal function. The left ventricle has no regional wall motion abnormalities. The left ventricular internal cavity size was normal in size. There is  no left ventricular hypertrophy. Left ventricular diastolic parameters are indeterminate. Right Ventricle: The right ventricular size is normal. No increase in right ventricular wall thickness. Right ventricular systolic function is normal. Left Atrium: Left atrial size was normal in size. Right Atrium: Right atrial size was normal in size. Pericardium: There is no evidence of pericardial effusion. Mitral Valve: The mitral valve is normal in structure. No evidence of mitral valve regurgitation. No evidence of mitral valve stenosis. Tricuspid Valve: The tricuspid valve is normal in structure. Tricuspid valve regurgitation is not demonstrated. No evidence of tricuspid stenosis. Aortic Valve: The aortic valve is tricuspid. Aortic valve regurgitation is not visualized. No aortic stenosis is present. Aortic valve mean gradient measures 2.6 mmHg. Aortic valve peak gradient measures 4.8 mmHg. Aortic valve area, by VTI measures 2.66 cm. Pulmonic Valve: The pulmonic valve was not well visualized. Pulmonic valve regurgitation is not visualized. No evidence of pulmonic stenosis. Aorta: The aortic root is normal in size and structure. Pulmonary Artery: Indeterminant PASP, inadequate TR jet. Venous: The inferior vena cava is normal in size with greater than 50% respiratory variability, suggesting right atrial pressure of 3 mmHg. IAS/Shunts: No atrial level shunt detected by color flow Doppler.  LEFT VENTRICLE PLAX 2D LVIDd:         4.25 cm  Diastology LVIDs:         2.72 cm  LV e' medial:    5.44 cm/s LV PW:          0.90 cm  LV E/e' medial:  10.3 LV IVS:        0.90 cm  LV e' lateral:   8.70 cm/s LVOT diam:     2.10 cm  LV E/e' lateral: 6.5 LV SV:         56 LV SV Index:   31 LVOT Area:     3.46 cm  RIGHT VENTRICLE RV S prime:     19.40 cm/s TAPSE (M-mode): 2.4 cm LEFT ATRIUM             Index       RIGHT ATRIUM  Index LA diam:        3.90 cm 2.15 cm/m  RA Area:     17.30 cm LA Vol (A2C):   58.7 ml 32.36 ml/m RA Volume:   44.60 ml  24.59 ml/m LA Vol (A4C):   28.2 ml 15.55 ml/m LA Biplane Vol: 42.5 ml 23.43 ml/m  AORTIC VALVE AV Area (Vmax):    2.27 cm AV Area (Vmean):   2.09 cm AV Area (VTI):     2.66 cm AV Vmax:           109.37 cm/s AV Vmean:          76.931 cm/s AV VTI:            0.209 m AV Peak Grad:      4.8 mmHg AV Mean Grad:      2.6 mmHg LVOT Vmax:         71.65 cm/s LVOT Vmean:        46.508 cm/s LVOT VTI:          0.160 m LVOT/AV VTI ratio: 0.77  AORTA Ao Root diam: 3.60 cm MITRAL VALVE MV Area (PHT): 4.10 cm    SHUNTS MV Decel Time: 185 msec    Systemic VTI:  0.16 m MV E velocity: 56.30 cm/s  Systemic Diam: 2.10 cm MV A velocity: 37.20 cm/s MV E/A ratio:  1.51 Carlyle Dolly MD Electronically signed by Carlyle Dolly MD Signature Date/Time: 09/15/2020/11:28:59 AM    Final         Scheduled Meds: . enoxaparin (LOVENOX) injection  75 mg Subcutaneous BID  . folic acid  1 mg Oral Daily  . irbesartan  300 mg Oral Daily  . nicotine  21 mg Transdermal Daily  . pantoprazole  40 mg Oral Daily  . thiamine  100 mg Oral Daily    LOS: 1 day    Time spent: 35 minutes    Micheal Cardell Darleen Crocker, DO [facility]  If 7PM-7AM, please contact night-coverage [email or web address] 09/16/2020, 11:08 AM

## 2020-09-17 LAB — BASIC METABOLIC PANEL
Anion gap: 12 (ref 5–15)
BUN: 6 mg/dL — ABNORMAL LOW (ref 8–23)
CO2: 22 mmol/L (ref 22–32)
Calcium: 8.5 mg/dL — ABNORMAL LOW (ref 8.9–10.3)
Chloride: 100 mmol/L (ref 98–111)
Creatinine, Ser: 0.65 mg/dL (ref 0.61–1.24)
GFR, Estimated: >60 mL/min (ref >60)
Glucose, Bld: 105 mg/dL — ABNORMAL HIGH (ref 70–99)
Potassium: 3.5 mmol/L (ref 3.5–5.1)
Sodium: 134 mmol/L — ABNORMAL LOW (ref 135–145)

## 2020-09-17 LAB — CBC
HCT: 53.3 % — ABNORMAL HIGH (ref 39.0–52.0)
Hemoglobin: 18.4 g/dL — ABNORMAL HIGH (ref 13.0–17.0)
MCH: 33.3 pg (ref 26.0–34.0)
MCHC: 34.5 g/dL (ref 30.0–36.0)
MCV: 96.6 fL (ref 80.0–100.0)
Platelets: 181 10*3/uL (ref 150–400)
RBC: 5.52 MIL/uL (ref 4.22–5.81)
RDW: 11.7 % (ref 11.5–15.5)
WBC: 8.5 10*3/uL (ref 4.0–10.5)
nRBC: 0 % (ref 0.0–0.2)

## 2020-09-17 LAB — PROTEIN C ACTIVITY: Protein C Activity: 64 % — ABNORMAL LOW (ref 73–180)

## 2020-09-17 LAB — PROTEIN S, TOTAL: Protein S Ag, Total: 85 % (ref 60–150)

## 2020-09-17 LAB — HEXAGONAL PHASE PHOSPHOLIPID: Hexagonal Phase Phospholipid: 6 s (ref 0–11)

## 2020-09-17 LAB — PROTEIN C, TOTAL: Protein C, Total: 50 % — ABNORMAL LOW (ref 60–150)

## 2020-09-17 LAB — PROTEIN S ACTIVITY: Protein S Activity: 79 % (ref 63–140)

## 2020-09-17 LAB — PTT-LA MIX: PTT-LA Mix: 59.1 s — ABNORMAL HIGH (ref 0.0–48.9)

## 2020-09-17 LAB — LUPUS ANTICOAGULANT PANEL
DRVVT: 76.3 s — ABNORMAL HIGH (ref 0.0–47.0)
PTT Lupus Anticoagulant: 59.4 s — ABNORMAL HIGH (ref 0.0–51.9)

## 2020-09-17 LAB — MAGNESIUM: Magnesium: 1.9 mg/dL (ref 1.7–2.4)

## 2020-09-17 LAB — DRVVT CONFIRM: dRVVT Confirm: 1.9 ratio — ABNORMAL HIGH (ref 0.8–1.2)

## 2020-09-17 LAB — DRVVT MIX: dRVVT Mix: 44.6 s — ABNORMAL HIGH (ref 0.0–40.4)

## 2020-09-17 MED ORDER — POLYETHYLENE GLYCOL 3350 17 G PO PACK
17.0000 g | PACK | Freq: Every day | ORAL | Status: DC
  Administered 2020-09-17 – 2020-09-18 (×2): 17 g via ORAL
  Filled 2020-09-17 (×2): qty 1

## 2020-09-17 MED ORDER — FLEET ENEMA 7-19 GM/118ML RE ENEM: 1.0000 | ENEMA | Freq: Once | RECTAL | Status: DC

## 2020-09-17 MED ORDER — SENNOSIDES-DOCUSATE SODIUM 8.6-50 MG PO TABS
1.0000 | ORAL_TABLET | Freq: Two times a day (BID) | ORAL | Status: DC
  Administered 2020-09-17 – 2020-09-18 (×3): 1 via ORAL
  Filled 2020-09-17 (×3): qty 1

## 2020-09-17 MED ORDER — ADALIMUMAB 40 MG/0.4ML ~~LOC~~ AJKT: 40.0000 mg | AUTO-INJECTOR | SUBCUTANEOUS | Status: DC

## 2020-09-17 NOTE — Progress Notes (Signed)
PROGRESS NOTE    Micheal Serrano  IOX:[id number] DOB: 1955-10-03 DOA: 09/14/2020 PCP: Celene Squibb, MD   Brief Narrative:   Micheal Serrano a 65 y.o.malewith medical history significant ofalcohol abuse, gastroesophageal disease, prior history of hepatitis C (has completed treatment in the past for this condition), chronic lower back pain and hypertension; who presented to the hospital secondary to worsening pleuritic right-sided chest pain.Patient was noted to have bilateral pulmonary emboli and has been started on therapeutic Lovenox. 2D echocardiogram with LVEF 60-65% and no DVTs noted. He continues to require high amounts of oxygen.  Assessment & Plan:   Active Problems:   Pulmonary embolism (HCC)   PE (pulmonary thromboembolism) ([place])   Acute bilateral PE-unprovoked -May be related to tobacco abuse -Continue on Lovenox as ordered -2D echocardiogram with LVEF 60-65% and no signs of RV strain -Lower extremity Dopplers with no findings of DVT -Hypercoagulable panel with anticipated follow-up with hematology outpatient -Encouraged ambulation and incentive spirometry -Continues to wean on O2 use -Will need Eliquis on DC  Hypertension-stable -Continue Avapro  Hyponatremia-improved -TSH WNL -No sign of SIADH -May be chronic and related to alcohol use  Tobacco abuse -Counseled on cessation -Refused nicotine patch  History of alcohol abuse -CIWA protocol  GI prophylaxis -PPI  Chronicbackpain -Continue meds as ordered  Constipation -Senna/docusate and MiraLAX ordered  DVT prophylaxis:Full dose Lovenox Code Status:Full Family Communication:Wife at bedside 4/8 Disposition Plan: Status is: Inpatient  Remains inpatient appropriate because:Hemodynamically unstable, IV treatments appropriate due to intensity of illness or inability to take PO and Inpatient level of care appropriate due to severity of illness   Dispo: The patient is  from:Home Anticipated d/c is QA:[facility] Patient currently is not medically stable to d/c. Difficult to place patient No  Consultants:  None  Procedures:  See below  Antimicrobials:  None  Subjective: Patient seen and evaluated today with no new acute complaints or concerns. No acute concerns or events noted overnight.  He states that he is feeling much better, but does have some intermittent chest pain.  He states that he has not had a bowel movement in several days.  Objective: Vitals:   09/16/20 1351 09/16/20 2029 09/17/20 0010 09/17/20 0516  BP: 120/77 (!) 145/81 114/69 (!) 141/84  Pulse: 86 87 75 72  Resp:  18 16 14   Temp: 97.6 F (36.4 C) (!) 97.4 F (36.3 C) 97.8 F (36.6 C) (!) 97.5 F (36.4 C)  TempSrc: Oral  Oral Oral  SpO2: 91% 96% 93% 92%  Weight:      Height:        Intake/Output Summary (Last 24 hours) at 09/17/2020 1055 Last data filed at 09/17/2020 0700 Gross per 24 hour  Intake 1659.05 ml  Output 1200 ml  Net 459.05 ml   Filed Weights   09/14/20 0804 09/14/20 1507  Weight: 72.6 kg 72.1 kg    Examination:  General exam: Appears calm and comfortable  Respiratory system: Clear to auscultation. Respiratory effort normal.  Currently on 5.5 L nasal cannula oxygen. Cardiovascular system: S1 & S2 heard, RRR.  Gastrointestinal system: Abdomen is soft Central nervous system: Alert and awake Extremities: No edema Skin: No significant lesions noted Psychiatry: Flat affect.    Data Reviewed: I have personally reviewed following labs and imaging studies  CBC: Recent Labs  Lab 09/14/20 0825 09/15/20 1214 09/16/20 0555 09/17/20 0528  WBC 11.0* 9.8 9.2 8.5  NEUTROABS 7.2  --   --   --   HGB 19.7*  18.6* 17.8* 18.4*  HCT 57.6* 54.9* 53.0* 53.3*  MCV 98.6 97.7 97.4 96.6  PLT 151 159 159 824   Basic Metabolic Panel: Recent Labs  Lab 09/14/20 0825 09/15/20 1214 09/16/20 0555 09/17/20 0528  NA 134*  127* 130* 134*  K 4.0 3.7 3.7 3.5  CL 106 96* 98 100  CO2 18* 18* 20* 22  GLUCOSE 83 103* 90 105*  BUN 9 11 8  6*  CREATININE 0.67 0.74 0.65 0.65  CALCIUM 8.6* 8.4* 8.7* 8.5*  MG  --   --  1.6* 1.9   GFR: Estimated Creatinine Clearance: 85.3 mL/min (by C-G formula based on SCr of 0.65 mg/dL). Liver Function Tests: Recent Labs  Lab 09/14/20 0825  AST 13*  ALT 16  ALKPHOS 55  BILITOT 1.2  PROT 7.1  ALBUMIN 3.5   Recent Labs  Lab 09/14/20 0825  LIPASE 21   No results for input(s): AMMONIA in the last 168 hours. Coagulation Profile: No results for input(s): INR, PROTIME in the last 168 hours. Cardiac Enzymes: No results for input(s): CKTOTAL, CKMB, CKMBINDEX, TROPONINI in the last 168 hours. BNP (last 3 results) No results for input(s): PROBNP in the last 8760 hours. HbA1C: No results for input(s): HGBA1C in the last 72 hours. CBG: No results for input(s): GLUCAP in the last 168 hours. Lipid Profile: No results for input(s): CHOL, HDL, LDLCALC, TRIG, CHOLHDL, LDLDIRECT in the last 72 hours. Thyroid Function Tests: Recent Labs    09/16/20 1031  TSH 1.668   Anemia Panel: No results for input(s): VITAMINB12, FOLATE, FERRITIN, TIBC, IRON, RETICCTPCT in the last 72 hours. Sepsis Labs: No results for input(s): PROCALCITON, LATICACIDVEN in the last 168 hours.  Recent Results (from the past 240 hour(s))  Resp Panel by RT-PCR (Flu A&B, Covid) Nasopharyngeal Swab     Status: None   Collection Time: 09/14/20 11:31 AM   Specimen: Nasopharyngeal Swab; Nasopharyngeal(NP) swabs in vial transport medium  Result Value Ref Range Status   SARS Coronavirus 2 by RT PCR NEGATIVE NEGATIVE Final    Comment: (NOTE) SARS-CoV-2 target nucleic acids are NOT DETECTED.  The SARS-CoV-2 RNA is generally detectable in upper respiratory specimens during the acute phase of infection. The lowest concentration of SARS-CoV-2 viral copies this assay can detect is 138 copies/mL. A negative result  does not preclude SARS-Cov-2 infection and should not be used as the sole basis for treatment or other patient management decisions. A negative result may occur with  improper specimen collection/handling, submission of specimen other than nasopharyngeal swab, presence of viral mutation(s) within the areas targeted by this assay, and inadequate number of viral copies(<138 copies/mL). A negative result must be combined with clinical observations, patient history, and epidemiological information. The expected result is Negative.  Fact Sheet for Patients:  EntrepreneurPulse.com.au  Fact Sheet for Healthcare Providers:  IncredibleEmployment.be  This test is no t yet approved or cleared by the [place] FDA and  has been authorized for detection and/or diagnosis of SARS-CoV-2 by FDA under an Emergency Use Authorization (EUA). This EUA will remain  in effect (meaning this test can be used) for the duration of the COVID-19 declaration under Section 564(b)(1) of the Act, 21 U.S.C.section 360bbb-3(b)(1), unless the authorization is terminated  or revoked sooner.       Influenza A by PCR NEGATIVE NEGATIVE Final   Influenza B by PCR NEGATIVE NEGATIVE Final    Comment: (NOTE) The Xpert Xpress SARS-CoV-2/FLU/RSV plus assay is intended as an aid in the diagnosis of  influenza from Nasopharyngeal swab specimens and should not be used as a sole basis for treatment. Nasal washings and aspirates are unacceptable for Xpert Xpress SARS-CoV-2/FLU/RSV testing.  Fact Sheet for Patients: EntrepreneurPulse.com.au  Fact Sheet for Healthcare Providers: IncredibleEmployment.be  This test is not yet approved or cleared by the [place] FDA and has been authorized for detection and/or diagnosis of SARS-CoV-2 by FDA under an Emergency Use Authorization (EUA). This EUA will remain in effect (meaning this test can be used) for  the duration of the COVID-19 declaration under Section 564(b)(1) of the Act, 21 U.S.C. section 360bbb-3(b)(1), unless the authorization is terminated or revoked.  Performed at [facility], [address], [place],  [address]          Radiology Studies: US Venous Img Lower Bilateral (DVT)  Result Date: 09/15/2020 CLINICAL DATA:  Shortness of breath EXAM: BILATERAL LOWER EXTREMITY VENOUS DOPPLER ULTRASOUND TECHNIQUE: Gray-scale sonography with graded compression, as well as color Doppler and duplex ultrasound were performed to evaluate the lower extremity deep venous systems from the level of the common femoral vein and including the common femoral, femoral, profunda femoral, popliteal and calf veins including the posterior tibial, peroneal and gastrocnemius veins when visible. The superficial great saphenous vein was also interrogated. Spectral Doppler was utilized to evaluate flow at rest and with distal augmentation maneuvers in the common femoral, femoral and popliteal veins. COMPARISON:  None. FINDINGS: RIGHT LOWER EXTREMITY Common Femoral Vein: No evidence of thrombus. Normal compressibility, respiratory phasicity and response to augmentation. Saphenofemoral Junction: No evidence of thrombus. Normal compressibility and flow on color Doppler imaging. Profunda Femoral Vein: No evidence of thrombus. Normal compressibility and flow on color Doppler imaging. Femoral Vein: No evidence of thrombus. Normal compressibility, respiratory phasicity and response to augmentation. Popliteal Vein: No evidence of thrombus. Normal compressibility, respiratory phasicity and response to augmentation. Calf Veins: No evidence of thrombus. Normal compressibility and flow on color Doppler imaging. Superficial Great Saphenous Vein: No evidence of thrombus. Normal compressibility. Venous Reflux:  None. Other Findings:  None. LEFT LOWER EXTREMITY Common Femoral Vein: No evidence of thrombus. Normal compressibility,  respiratory phasicity and response to augmentation. Saphenofemoral Junction: No evidence of thrombus. Normal compressibility and flow on color Doppler imaging. Profunda Femoral Vein: No evidence of thrombus. Normal compressibility and flow on color Doppler imaging. Femoral Vein: No evidence of thrombus. Normal compressibility, respiratory phasicity and response to augmentation. Popliteal Vein: No evidence of thrombus. Normal compressibility, respiratory phasicity and response to augmentation. Calf Veins: No evidence of thrombus. Normal compressibility and flow on color Doppler imaging. Superficial Great Saphenous Vein: No evidence of thrombus. Normal compressibility. Venous Reflux:  None. Other Findings:  None. IMPRESSION: No evidence of deep venous thrombosis. Electronically Signed   By: Jacqulynn Cadet M.D.   On: 09/15/2020 11:07        Scheduled Meds: . [START ON 09/18/2020] Adalimumab  40 mg Subcutaneous Q14 Days  . enoxaparin (LOVENOX) injection  75 mg Subcutaneous BID  . folic acid  1 mg Oral Daily  . irbesartan  300 mg Oral Daily  . nicotine  21 mg Transdermal Daily  . pantoprazole  40 mg Oral Daily  . polyethylene glycol  17 g Oral Daily  . senna-docusate  1 tablet Oral BID  . thiamine  100 mg Oral Daily    LOS: 2 days    Time spent: 35 minutes    Aayansh Codispoti Darleen Crocker, DO [facility]  If 7PM-7AM, please contact night-coverage [email or web address] 09/17/2020, 10:55 AM

## 2020-09-17 NOTE — Progress Notes (Signed)
Pt resting comfortably in bed in no acute distress. A&O. Rates back pain 2/10, see MAR. Wife at bedside. Tolerating diet. Nicotine patch in place. CIWA negative at this time. Passing gas. O2 @ 5L/[place]. Call bell within reach. Will continue to monitor.

## 2020-09-17 NOTE — Progress Notes (Signed)
Pt currently on O2 @ 4L/[place], maintaining oxygen saturation at 92%.

## 2020-09-18 LAB — CBC
HCT: 53.2 % — ABNORMAL HIGH (ref 39.0–52.0)
Hemoglobin: 17.8 g/dL — ABNORMAL HIGH (ref 13.0–17.0)
MCH: 32.9 pg (ref 26.0–34.0)
MCHC: 33.5 g/dL (ref 30.0–36.0)
MCV: 98.3 fL (ref 80.0–100.0)
Platelets: 219 10*3/uL (ref 150–400)
RBC: 5.41 MIL/uL (ref 4.22–5.81)
RDW: 11.9 % (ref 11.5–15.5)
WBC: 10.6 10*3/uL — ABNORMAL HIGH (ref 4.0–10.5)
nRBC: 0 % (ref 0.0–0.2)

## 2020-09-18 MED ORDER — APIXABAN 5 MG PO TABS: 5.0000 mg | ORAL_TABLET | Freq: Two times a day (BID) | ORAL | Status: DC

## 2020-09-18 MED ORDER — APIXABAN 5 MG PO TABS: 10.0000 mg | ORAL_TABLET | Freq: Two times a day (BID) | ORAL | 0 refills | Status: DC

## 2020-09-18 MED ORDER — APIXABAN 5 MG PO TABS
10.0000 mg | ORAL_TABLET | Freq: Two times a day (BID) | ORAL | Status: DC
  Administered 2020-09-18: 10 mg via ORAL
  Filled 2020-09-18: qty 2

## 2020-09-18 MED ORDER — THIAMINE HCL 100 MG PO TABS: 100.0000 mg | ORAL_TABLET | Freq: Every day | ORAL | 0 refills | Status: AC

## 2020-09-18 MED ORDER — FOLIC ACID 1 MG PO TABS: 1.0000 mg | ORAL_TABLET | Freq: Every day | ORAL | 0 refills | Status: AC

## 2020-09-18 MED ORDER — OXYCODONE HCL 5 MG PO TABS: 5.0000 mg | ORAL_TABLET | Freq: Four times a day (QID) | ORAL | 0 refills | Status: DC | PRN

## 2020-09-18 MED ORDER — APIXABAN 5 MG PO TABS: 5.0000 mg | ORAL_TABLET | Freq: Two times a day (BID) | ORAL | 1 refills | Status: AC

## 2020-09-18 MED ORDER — SENNOSIDES-DOCUSATE SODIUM 8.6-50 MG PO TABS: 1.0000 | ORAL_TABLET | Freq: Two times a day (BID) | ORAL | 0 refills | Status: AC

## 2020-09-18 NOTE — Progress Notes (Signed)
SATURATION QUALIFICATIONS: (This note is used to comply with regulatory documentation for home oxygen)  Patient Saturations on Room Air at Rest = 95%  Patient Saturations on Room Air while Ambulating = 90%  Patient Saturations on 3 Liters of oxygen while Ambulating = 96%

## 2020-09-18 NOTE — Progress Notes (Signed)
Assumed pt care at 0700. Report received from Maudie Mercury, [place]. Pt resting comfortably in bed in no acute distress. A&O. Wife at bedside. O2 @ 3.5L/[place]. Oxygen sats 92%. VSS. Denies pain. Tolerating diet. No N/V. LBM 09/17/20. Call bell within reach. Will continue to monitor.

## 2020-09-18 NOTE — Discharge Summary (Addendum)
Physician Discharge Summary  Micheal Serrano TKW:[id number] DOB: 1955/12/12 DOA: 09/14/2020  PCP: Celene Squibb, MD  Admit date: 09/14/2020  Discharge date: 09/18/2020  Admitted From:Home  Disposition:  Home  Recommendations for Outpatient Follow-up:  1. Follow up with PCP in 1-2 [place] 2. Follow-up to be arranged with hematology Dr. Delton Coombes to evaluate for other hypercoagulable disorders and manage Eliquis 3. Continue on Eliquis as prescribed for treatment of PE 4. Patient given oxycodone 5 mg for breakthrough pain for 10 tablets and 0 refills 5. Continue with home medications as prior 6. Counseled on smoking and alcohol cessation  Home Health: None  Equipment/Devices: Home nasal cannula  Discharge Condition:Stable  CODE STATUS: Full  Diet recommendation: Heart Healthy  Brief/Interim Summary: Micheal Serrano a 64 y.o.malewith medical history significant ofalcohol abuse, gastroesophageal disease, prior history of hepatitis C (has completed treatment in the past for this condition), chronic lower back pain and hypertension; who presented to the hospital secondary to worsening pleuritic right-sided chest pain.Patient was noted to have bilateral pulmonary emboli and had been started on therapeutic Lovenox with quick improvement of his symptoms. 2D echocardiogram with LVEF 60-65% and no DVTs noted.  He had initially required over 12 L of high flow nasal cannula oxygen and this has now been weaned down to 3-4 L and he is overall feeling much better and appears stable for discharge on home oxygen.  He did not appear to have any provoking cause for his pulmonary emboli and therefore, hypercoagulable panel was performed.  Similarly values for glucose testing appear to be elevated and he has been encouraged to follow-up with hematology as noted above.  He is to continue Eliquis as prescribed for at least the next 6 months if not indefinitely.   Discharge Diagnoses:  Active Problems:    Pulmonary embolism (HCC)   PE (pulmonary thromboembolism) ([place])  Principal discharge diagnosis: Acute hypoxemic respiratory failure secondary to bilateral pulmonary emboli-unprovoked.  Discharge Instructions  Discharge Instructions    Diet - low sodium heart healthy   Complete by: As directed    Increase activity slowly   Complete by: As directed      Allergies as of 09/18/2020   No Known Allergies     Medication List    STOP taking these medications   azithromycin 250 MG tablet Commonly known as: ZITHROMAX   benzonatate 100 MG capsule Commonly known as: TESSALON   HYDROcodone-acetaminophen 5-325 MG tablet Commonly known as: NORCO/VICODIN     TAKE these medications   acetaminophen 325 MG tablet Commonly known as: TYLENOL Take 650 mg by mouth every 6 (six) hours as needed for mild pain.   apixaban 5 MG Tabs tablet Commonly known as: ELIQUIS Take 2 tablets (10 mg total) by mouth 2 (two) times daily for 7 days.   apixaban 5 MG Tabs tablet Commonly known as: ELIQUIS Take 1 tablet (5 mg total) by mouth 2 (two) times daily. Start taking on: September 25, 9240   folic acid 1 MG tablet Commonly known as: FOLVITE Take 1 tablet (1 mg total) by mouth daily. Start taking on: September 19, 2020   Humira Pen 40 MG/0.4ML Pnkt Generic drug: Adalimumab Inject 40 mg into the skin every 14 (fourteen) days.   olmesartan 40 MG tablet Commonly known as: BENICAR Take 40 mg by mouth daily.   oxyCODONE 5 MG immediate release tablet Commonly known as: Oxy IR/ROXICODONE Take 1 tablet (5 mg total) by mouth every 6 (six) hours as needed for moderate  pain or breakthrough pain.   senna-docusate 8.6-50 MG tablet Commonly known as: Senokot-S Take 1 tablet by mouth 2 (two) times daily for 20 days.   thiamine 100 MG tablet Take 1 tablet (100 mg total) by mouth daily. Start taking on: September 19, 2020   tiZANidine 4 MG tablet Commonly known as: Zanaflex Take 1 tablet (4 mg total) by mouth  every 8 (eight) hours as needed for muscle spasms (Medication cause sedation. Avoid driving.).            Durable Medical Equipment  (From admission, onward)         Start     Ordered   09/18/20 1049  DME Oxygen  Once       Question Answer Comment  Length of Need 6 Months   Mode or (Route) Nasal cannula   Liters per Minute 4   Frequency Continuous (stationary and portable oxygen unit needed)   Oxygen conserving device Yes   Oxygen delivery system Gas      09/18/20 1049          No Known Allergies  Consultations:  None   Procedures/Studies: DG Lumbar Spine Complete  Result Date: 09/13/2020 CLINICAL DATA:  Localized lumbar pain after moving furniture. EXAM: LUMBAR SPINE - COMPLETE 4+ VIEW COMPARISON:  April 22, 2019 FINDINGS: Lumbar spinal alignment is maintained. Vertebral body heights are normal. No sign of fracture. Approximately 3 mm retrolisthesis of T12 on L1. Similar mild retrolisthesis of L1 on L2. Not definitely seen on previous examinations. Disc space narrowing, moderate at T12-L1, L1-L2 with mild disc space narrowing at L2-3, L3-4 and L4-5. Marked disc space narrowing and associated sclerosis of the endplates of L5 and S1. Facet arthropathy greatest at L4-5 and L5-S1. IMPRESSION: 1. Multilevel degenerative disc disease greatest at L5-S1. 2. Mild retrolisthesis of T12 on L1 and L1 on L2, not seen on the previous exam and associated with some slight increase in disc space narrowing at these levels since the prior study from November of 2020. Electronically Signed   By: Zetta Bills M.D.   On: 09/13/2020 12:54   CT Angio Chest PE W and/or Wo Contrast  Result Date: 09/14/2020 CLINICAL DATA:  Right-sided chest pain and shortness of breath, high probability for PE EXAM: CT ANGIOGRAPHY CHEST WITH CONTRAST TECHNIQUE: Multidetector CT imaging of the chest was performed using the standard protocol during bolus administration of intravenous contrast. Multiplanar CT image  reconstructions and MIPs were obtained to evaluate the vascular anatomy. CONTRAST:  166mL OMNIPAQUE IOHEXOL 350 MG/ML SOLN COMPARISON:  None. FINDINGS: Cardiovascular: Positive for central filling defect within segmental arteries of the right and left lower lobes. No evidence of right heart strain. Main pulmonary artery is normal in caliber. The aorta is normal in caliber. Scattered atherosclerotic vascular calcifications. Multifocal coronary artery calcifications. No pericardial effusion. Mediastinum/Nodes: Unremarkable CT appearance of the thyroid gland. No suspicious mediastinal or hilar adenopathy. No soft tissue mediastinal mass. Small hiatal hernia. Lungs/Pleura: Mild centrilobular pulmonary emphysema. Diffuse bilateral lower lobe bronchial wall thickening. Dependent atelectasis bilaterally. Mild ground-glass attenuation airspace opacity in the inferior right lower lobe in the region of the pulmonary embolus likely representing small pulmonary infarct/reperfusion injury. Trace right pleural effusion. Upper Abdomen: No acute abnormality. Musculoskeletal: No acute osseous abnormality. Review of the MIP images confirms the above findings. IMPRESSION: 1. Positive for acute bilateral lower lobe segmental pulmonary emboli. 2. Mild ground-glass attenuation airspace opacity in the posteroinferior right lower lobe in the region of acute PE likely  representing early pulmonary infarction/reperfusion injury. 3. Trace right pleural effusion. 4. Small hiatal hernia. 5. Aortic atherosclerosis and mild centrilobular pulmonary emphysema. 6. Diffuse bilateral lower lobe bronchial wall thickening most consistent with chronic bronchitis. Aortic Atherosclerosis (ICD10-I70.0) and Emphysema (ICD10-J43.9). Electronically Signed   By: Jacqulynn Cadet M.D.   On: 09/14/2020 10:22   US Venous Img Lower Bilateral (DVT)  Result Date: 09/15/2020 CLINICAL DATA:  Shortness of breath EXAM: BILATERAL LOWER EXTREMITY VENOUS DOPPLER  ULTRASOUND TECHNIQUE: Gray-scale sonography with graded compression, as well as color Doppler and duplex ultrasound were performed to evaluate the lower extremity deep venous systems from the level of the common femoral vein and including the common femoral, femoral, profunda femoral, popliteal and calf veins including the posterior tibial, peroneal and gastrocnemius veins when visible. The superficial great saphenous vein was also interrogated. Spectral Doppler was utilized to evaluate flow at rest and with distal augmentation maneuvers in the common femoral, femoral and popliteal veins. COMPARISON:  None. FINDINGS: RIGHT LOWER EXTREMITY Common Femoral Vein: No evidence of thrombus. Normal compressibility, respiratory phasicity and response to augmentation. Saphenofemoral Junction: No evidence of thrombus. Normal compressibility and flow on color Doppler imaging. Profunda Femoral Vein: No evidence of thrombus. Normal compressibility and flow on color Doppler imaging. Femoral Vein: No evidence of thrombus. Normal compressibility, respiratory phasicity and response to augmentation. Popliteal Vein: No evidence of thrombus. Normal compressibility, respiratory phasicity and response to augmentation. Calf Veins: No evidence of thrombus. Normal compressibility and flow on color Doppler imaging. Superficial Great Saphenous Vein: No evidence of thrombus. Normal compressibility. Venous Reflux:  None. Other Findings:  None. LEFT LOWER EXTREMITY Common Femoral Vein: No evidence of thrombus. Normal compressibility, respiratory phasicity and response to augmentation. Saphenofemoral Junction: No evidence of thrombus. Normal compressibility and flow on color Doppler imaging. Profunda Femoral Vein: No evidence of thrombus. Normal compressibility and flow on color Doppler imaging. Femoral Vein: No evidence of thrombus. Normal compressibility, respiratory phasicity and response to augmentation. Popliteal Vein: No evidence of  thrombus. Normal compressibility, respiratory phasicity and response to augmentation. Calf Veins: No evidence of thrombus. Normal compressibility and flow on color Doppler imaging. Superficial Great Saphenous Vein: No evidence of thrombus. Normal compressibility. Venous Reflux:  None. Other Findings:  None. IMPRESSION: No evidence of deep venous thrombosis. Electronically Signed   By: Jacqulynn Cadet M.D.   On: 09/15/2020 11:07   DG Chest Port 1 View  Result Date: 09/14/2020 CLINICAL DATA:  Pain EXAM: PORTABLE CHEST 1 VIEW COMPARISON:  December 12, 2014 FINDINGS: There is atelectatic change in the left base. There is underlying interstitial thickening which is stable. No consolidation apparent. Heart is upper normal in size with pulmonary vascularity normal. There is aortic atherosclerosis. There is postoperative change in the lower cervical spine. There is midthoracic dextroscoliosis. IMPRESSION: Left base atelectasis. No appreciable edema or consolidation. Underlying interstitial thickening may indicate changes of chronic bronchitis. Heart upper normal in size.  Aortic Atherosclerosis (ICD10-I70.0). Electronically Signed   By: Lowella Grip III M.D.   On: 09/14/2020 08:25   ECHOCARDIOGRAM COMPLETE  Result Date: 09/15/2020    ECHOCARDIOGRAM REPORT   Patient Name:   Micheal Serrano Date of Exam: 09/15/2020 Medical Rec #:  [id number]       Height:       66.0 in Accession #:    [id number]      Weight:       159.0 lb Date of Birth:  1956-05-17      BSA:  1.814 m Patient Age:    57 years        BP:           120/77 mmHg Patient Gender: M               HR:           73 bpm. Exam Location:  [facility] Procedure: 2D Echo Indications:    Chest Pain R07.9  History:        Patient has prior history of Echocardiogram examinations, most                 recent 12/13/2014. Signs/Symptoms:Chest Pain; Risk Factors:Current                 Smoker and Hypertension. Pulmonary Embolism, ETOH, GERD.  Sonographer:    Leavy Cella RDCS (AE) Referring Phys: [facility]  1. Left ventricular ejection fraction, by estimation, is 60 to 65%. The left ventricle has normal function. The left ventricle has no regional wall motion abnormalities. Left ventricular diastolic parameters are indeterminate.  2. Right ventricular systolic function is normal. The right ventricular size is normal.  3. The mitral valve is normal in structure. No evidence of mitral valve regurgitation. No evidence of mitral stenosis.  4. The aortic valve is tricuspid. Aortic valve regurgitation is not visualized. No aortic stenosis is present.  5. The inferior vena cava is normal in size with greater than 50% respiratory variability, suggesting right atrial pressure of 3 mmHg. FINDINGS  Left Ventricle: Left ventricular ejection fraction, by estimation, is 60 to 65%. The left ventricle has normal function. The left ventricle has no regional wall motion abnormalities. The left ventricular internal cavity size was normal in size. There is  no left ventricular hypertrophy. Left ventricular diastolic parameters are indeterminate. Right Ventricle: The right ventricular size is normal. No increase in right ventricular wall thickness. Right ventricular systolic function is normal. Left Atrium: Left atrial size was normal in size. Right Atrium: Right atrial size was normal in size. Pericardium: There is no evidence of pericardial effusion. Mitral Valve: The mitral valve is normal in structure. No evidence of mitral valve regurgitation. No evidence of mitral valve stenosis. Tricuspid Valve: The tricuspid valve is normal in structure. Tricuspid valve regurgitation is not demonstrated. No evidence of tricuspid stenosis. Aortic Valve: The aortic valve is tricuspid. Aortic valve regurgitation is not visualized. No aortic stenosis is present. Aortic valve mean gradient measures 2.6 mmHg. Aortic valve peak gradient measures 4.8 mmHg. Aortic valve area, by VTI measures  2.66 cm. Pulmonic Valve: The pulmonic valve was not well visualized. Pulmonic valve regurgitation is not visualized. No evidence of pulmonic stenosis. Aorta: The aortic root is normal in size and structure. Pulmonary Artery: Indeterminant PASP, inadequate TR jet. Venous: The inferior vena cava is normal in size with greater than 50% respiratory variability, suggesting right atrial pressure of 3 mmHg. IAS/Shunts: No atrial level shunt detected by color flow Doppler.  LEFT VENTRICLE PLAX 2D LVIDd:         4.25 cm  Diastology LVIDs:         2.72 cm  LV e' medial:    5.44 cm/s LV PW:         0.90 cm  LV E/e' medial:  10.3 LV IVS:        0.90 cm  LV e' lateral:   8.70 cm/s LVOT diam:     2.10 cm  LV E/e' lateral: 6.5 LV SV:  56 LV SV Index:   31 LVOT Area:     3.46 cm  RIGHT VENTRICLE RV S prime:     19.40 cm/s TAPSE (M-mode): 2.4 cm LEFT ATRIUM             Index       RIGHT ATRIUM           Index LA diam:        3.90 cm 2.15 cm/m  RA Area:     17.30 cm LA Vol (A2C):   58.7 ml 32.36 ml/m RA Volume:   44.60 ml  24.59 ml/m LA Vol (A4C):   28.2 ml 15.55 ml/m LA Biplane Vol: 42.5 ml 23.43 ml/m  AORTIC VALVE AV Area (Vmax):    2.27 cm AV Area (Vmean):   2.09 cm AV Area (VTI):     2.66 cm AV Vmax:           109.37 cm/s AV Vmean:          76.931 cm/s AV VTI:            0.209 m AV Peak Grad:      4.8 mmHg AV Mean Grad:      2.6 mmHg LVOT Vmax:         71.65 cm/s LVOT Vmean:        46.508 cm/s LVOT VTI:          0.160 m LVOT/AV VTI ratio: 0.77  AORTA Ao Root diam: 3.60 cm MITRAL VALVE MV Area (PHT): 4.10 cm    SHUNTS MV Decel Time: 185 msec    Systemic VTI:  0.16 m MV E velocity: 56.30 cm/s  Systemic Diam: 2.10 cm MV A velocity: 37.20 cm/s MV E/A ratio:  1.51 Carlyle Dolly MD Electronically signed by Carlyle Dolly MD Signature Date/Time: 09/15/2020/11:28:59 AM    Final       Discharge Exam: Vitals:   09/17/20 2117 09/18/20 0607  BP: (!) 141/76 106/68  Pulse: 72 76  Resp:    Temp: (!) 97.5 F (36.4  C) 98.3 F (36.8 C)  SpO2: 99% 95%   Vitals:   09/17/20 1701 09/17/20 1800 09/17/20 2117 09/18/20 0607  BP: 130/86  (!) 141/76 106/68  Pulse: 72  72 76  Resp: 18     Temp: (!) 97.5 F (36.4 C)  (!) 97.5 F (36.4 C) 98.3 F (36.8 C)  TempSrc: Oral  Oral Oral  SpO2: 95% 92% 99% 95%  Weight:      Height:        General: Pt is alert, awake, not in acute distress Cardiovascular: RRR, S1/S2 +, no rubs, no gallops Respiratory: CTA bilaterally, no wheezing, no rhonchi, on 4L [place] oxygen Abdominal: Soft, NT, ND, bowel sounds + Extremities: no edema, no cyanosis    The results of significant diagnostics from this hospitalization (including imaging, microbiology, ancillary and laboratory) are listed below for reference.     Microbiology: Recent Results (from the past 240 hour(s))  Resp Panel by RT-PCR (Flu A&B, Covid) Nasopharyngeal Swab     Status: None   Collection Time: 09/14/20 11:31 AM   Specimen: Nasopharyngeal Swab; Nasopharyngeal(NP) swabs in vial transport medium  Result Value Ref Range Status   SARS Coronavirus 2 by RT PCR NEGATIVE NEGATIVE Final    Comment: (NOTE) SARS-CoV-2 target nucleic acids are NOT DETECTED.  The SARS-CoV-2 RNA is generally detectable in upper respiratory specimens during the acute phase of infection. The lowest concentration of SARS-CoV-2 viral copies this  assay can detect is 138 copies/mL. A negative result does not preclude SARS-Cov-2 infection and should not be used as the sole basis for treatment or other patient management decisions. A negative result may occur with  improper specimen collection/handling, submission of specimen other than nasopharyngeal swab, presence of viral mutation(s) within the areas targeted by this assay, and inadequate number of viral copies(<138 copies/mL). A negative result must be combined with clinical observations, patient history, and epidemiological information. The expected result is Negative.  Fact Sheet  for Patients:  EntrepreneurPulse.com.au  Fact Sheet for Healthcare Providers:  IncredibleEmployment.be  This test is no t yet approved or cleared by the [place] FDA and  has been authorized for detection and/or diagnosis of SARS-CoV-2 by FDA under an Emergency Use Authorization (EUA). This EUA will remain  in effect (meaning this test can be used) for the duration of the COVID-19 declaration under Section 564(b)(1) of the Act, 21 U.S.C.section 360bbb-3(b)(1), unless the authorization is terminated  or revoked sooner.       Influenza A by PCR NEGATIVE NEGATIVE Final   Influenza B by PCR NEGATIVE NEGATIVE Final    Comment: (NOTE) The Xpert Xpress SARS-CoV-2/FLU/RSV plus assay is intended as an aid in the diagnosis of influenza from Nasopharyngeal swab specimens and should not be used as a sole basis for treatment. Nasal washings and aspirates are unacceptable for Xpert Xpress SARS-CoV-2/FLU/RSV testing.  Fact Sheet for Patients: EntrepreneurPulse.com.au  Fact Sheet for Healthcare Providers: IncredibleEmployment.be  This test is not yet approved or cleared by the [place] FDA and has been authorized for detection and/or diagnosis of SARS-CoV-2 by FDA under an Emergency Use Authorization (EUA). This EUA will remain in effect (meaning this test can be used) for the duration of the COVID-19 declaration under Section 564(b)(1) of the Act, 21 U.S.C. section 360bbb-3(b)(1), unless the authorization is terminated or revoked.  Performed at [facility], [address] [place], [place] [address]      Labs: BNP (last 3 results) No results for input(s): BNP in the last 8760 hours. Basic Metabolic Panel: Recent Labs  Lab 09/14/20 0825 09/15/20 1214 09/16/20 0555 09/17/20 0528  NA 134* 127* 130* 134*  K 4.0 3.7 3.7 3.5  CL 106 96* 98 100  CO2 18* 18* 20* 22  GLUCOSE 83 103* 90 105*  BUN 9 11 8   6*  CREATININE 0.67 0.74 0.65 0.65  CALCIUM 8.6* 8.4* 8.7* 8.5*  MG  --   --  1.6* 1.9   Liver Function Tests: Recent Labs  Lab 09/14/20 0825  AST 13*  ALT 16  ALKPHOS 55  BILITOT 1.2  PROT 7.1  ALBUMIN 3.5   Recent Labs  Lab 09/14/20 0825  LIPASE 21   No results for input(s): AMMONIA in the last 168 hours. CBC: Recent Labs  Lab 09/14/20 0825 09/15/20 1214 09/16/20 0555 09/17/20 0528 09/18/20 0543  WBC 11.0* 9.8 9.2 8.5 10.6*  NEUTROABS 7.2  --   --   --   --   HGB 19.7* 18.6* 17.8* 18.4* 17.8*  HCT 57.6* 54.9* 53.0* 53.3* 53.2*  MCV 98.6 97.7 97.4 96.6 98.3  PLT 151 159 159 181 219   Cardiac Enzymes: No results for input(s): CKTOTAL, CKMB, CKMBINDEX, TROPONINI in the last 168 hours. BNP: Invalid input(s): POCBNP CBG: No results for input(s): GLUCAP in the last 168 hours. D-Dimer No results for input(s): DDIMER in the last 72 hours. Hgb A1c No results for input(s): HGBA1C in the last 72 hours.  Lipid Profile No results for input(s): CHOL, HDL, LDLCALC, TRIG, CHOLHDL, LDLDIRECT in the last 72 hours. Thyroid function studies Recent Labs    09/16/20 1031  TSH 1.668   Anemia work up No results for input(s): VITAMINB12, FOLATE, FERRITIN, TIBC, IRON, RETICCTPCT in the last 72 hours. Urinalysis    Component Value Date/Time   COLORURINE YELLOW 09/14/2020 0811   APPEARANCEUR CLEAR 09/14/2020 0811   LABSPEC 1.024 09/14/2020 0811   PHURINE 5.0 09/14/2020 0811   GLUCOSEU NEGATIVE 09/14/2020 0811   HGBUR MODERATE (A) 09/14/2020 0811   BILIRUBINUR NEGATIVE 09/14/2020 0811   KETONESUR 20 (A) 09/14/2020 0811   PROTEINUR NEGATIVE 09/14/2020 0811   NITRITE NEGATIVE 09/14/2020 0811   LEUKOCYTESUR NEGATIVE 09/14/2020 0811   Sepsis Labs Invalid input(s): PROCALCITONIN,  WBC,  LACTICIDVEN Microbiology Recent Results (from the past 240 hour(s))  Resp Panel by RT-PCR (Flu A&B, Covid) Nasopharyngeal Swab     Status: None   Collection Time: 09/14/20 11:31 AM    Specimen: Nasopharyngeal Swab; Nasopharyngeal(NP) swabs in vial transport medium  Result Value Ref Range Status   SARS Coronavirus 2 by RT PCR NEGATIVE NEGATIVE Final    Comment: (NOTE) SARS-CoV-2 target nucleic acids are NOT DETECTED.  The SARS-CoV-2 RNA is generally detectable in upper respiratory specimens during the acute phase of infection. The lowest concentration of SARS-CoV-2 viral copies this assay can detect is 138 copies/mL. A negative result does not preclude SARS-Cov-2 infection and should not be used as the sole basis for treatment or other patient management decisions. A negative result may occur with  improper specimen collection/handling, submission of specimen other than nasopharyngeal swab, presence of viral mutation(s) within the areas targeted by this assay, and inadequate number of viral copies(<138 copies/mL). A negative result must be combined with clinical observations, patient history, and epidemiological information. The expected result is Negative.  Fact Sheet for Patients:  EntrepreneurPulse.com.au  Fact Sheet for Healthcare Providers:  IncredibleEmployment.be  This test is no t yet approved or cleared by the [place] FDA and  has been authorized for detection and/or diagnosis of SARS-CoV-2 by FDA under an Emergency Use Authorization (EUA). This EUA will remain  in effect (meaning this test can be used) for the duration of the COVID-19 declaration under Section 564(b)(1) of the Act, 21 U.S.C.section 360bbb-3(b)(1), unless the authorization is terminated  or revoked sooner.       Influenza A by PCR NEGATIVE NEGATIVE Final   Influenza B by PCR NEGATIVE NEGATIVE Final    Comment: (NOTE) The Xpert Xpress SARS-CoV-2/FLU/RSV plus assay is intended as an aid in the diagnosis of influenza from Nasopharyngeal swab specimens and should not be used as a sole basis for treatment. Nasal washings and aspirates are  unacceptable for Xpert Xpress SARS-CoV-2/FLU/RSV testing.  Fact Sheet for Patients: EntrepreneurPulse.com.au  Fact Sheet for Healthcare Providers: IncredibleEmployment.be  This test is not yet approved or cleared by the [place] FDA and has been authorized for detection and/or diagnosis of SARS-CoV-2 by FDA under an Emergency Use Authorization (EUA). This EUA will remain in effect (meaning this test can be used) for the duration of the COVID-19 declaration under Section 564(b)(1) of the Act, 21 U.S.C. section 360bbb-3(b)(1), unless the authorization is terminated or revoked.  Performed at [facility], [address], [place],  [address]      Time coordinating discharge: 35 minutes  SIGNED:   Rodena Goldmann, DO [facility] 09/18/2020, 10:52 AM  If 7PM-7AM, please contact night-coverage [email or web address]

## 2020-09-18 NOTE — TOC Transition Note (Signed)
Transition of Care [facility]) - CM/SW Discharge Note   Patient Details  Name: [name] MRN: [id number] Date of Birth: May 16, 1956  [facility]) CM/SW Contact:  Natasha Bence, LCSW Phone Number: 09/18/2020, 11:12 AM   Clinical Narrative:    CSW notified of patient's readiness for discharge and that patient is no longer in need of O2. Patient's wife requested CHF dietary information. CSW provided CHF book to RN. RN to provide it to patient. TOC signing off.    Final next level of care: Home/Self Care Barriers to Discharge: Barriers Resolved   Patient Goals and CMS Choice Patient states their goals for this hospitalization and ongoing recovery are:: Return home CMS Medicare.gov Compare Post Acute Care list provided to:: Patient Choice offered to / list presented to : Patient  Discharge Placement                    Patient and family notified of of transfer: 09/18/20  Discharge Plan and Services In-house Referral: Clinical Social Work              DME Arranged: N/A DME Agency: NA       HH Arranged: NA HH Agency: NA        Social Determinants of Health (SDOH) Interventions     Readmission Risk Interventions No flowsheet data found.

## 2020-09-18 NOTE — Progress Notes (Addendum)
Wife called for update, when I started to explain patient removed leads and walked out in hallway, wife immediately became upset saying "whoa whoa whoa that is unacceptable, you were not monitoring my husband." Explained that central tele called and I was literally greeted by patient coming up hall when I was going to check as they came off earlier when he went to bathroom. He was accessed and oriented immediately to where he was, walked with patient to room, who was steady and had not taken anything sedative on my shift. Patient was asleep at this time of speaking with wife, went to room to re-assure wife that he was ok, sats were currently showing 94% on 4 liters. Patient was awaken from me entering room, remains oriented x's 4. Patient began apologizing again and we all came to agreement to place bed alarm for safety during night d/t slight confusion upon awakening. Bed alarm in place for when patient leaves bed. Will continue to monitor.

## 2020-09-18 NOTE — Progress Notes (Signed)
ANTICOAGULATION CONSULT NOTE -   Pharmacy Consult for Heparin>>Lovenox>> Eliquis Indication: pulmonary embolus  No Known Allergies  Patient Measurements: Height: 5\' 6"  (167.6 cm) Weight: 72.1 kg (158 lb 15.2 oz) IBW/kg (Calculated) : 63.8 HEPARIN DW (KG): 72.1  Vital Signs: Temp: 98.3 F (36.8 C) (04/09 0607) Temp Source: Oral (04/09 0607) BP: 106/68 (04/09 0607) Pulse Rate: 76 (04/09 0607)  Labs: Recent Labs    09/15/20 1214 09/16/20 0555 09/17/20 0528 09/18/20 0543  HGB 18.6* 17.8* 18.4* 17.8*  HCT 54.9* 53.0* 53.3* 53.2*  PLT 159 159 181 219  CREATININE 0.74 0.65 0.65  --     Estimated Creatinine Clearance: 85.3 mL/min (by C-G formula based on SCr of 0.65 mg/dL).   Medical History: Past Medical History:  Diagnosis Date  . Alcohol abuse   . GERD (gastroesophageal reflux disease)   . Hepatitis C    states this has been treated and is clear  . Hypertension   . Neck pain     Medications:  See med rec  Assessment: Patient presented with right sided chest pain and shortness of breath. CT angiogram is positive for acute bilateral lower lobe segmental pulmonary emboli. Patient is not on any oral anticoagulants. Patient to transition to oral anticoagulation with apixaban now.   Goal of Therapy:  Anti-Xa = 0.6-1 Monitor platelets by anticoagulation protocol: Yes   Plan:  Dc lovenox Eliquis 10mg  po bid x 7 days, then 5mg  po bid Monitor for S/S of bleeding Educate on eliquis  Isac Sarna, BS [place], [place] Clinical Pharmacist Pager [phone] 09/18/2020 7:34 AM

## 2020-09-18 NOTE — Progress Notes (Signed)
Tele called at [phone] stating that pt's leads were off and 02 showing 82%. Immediately went around hall to access and patient was walking towards me from his room almost at nurses station. Asked if he was ok and he asked, "Well where am I, how do I get outside." After telling him he was in the hospital, patients states "Oh that's right I'm in 326, I'm sorry" patient was last seen in bed asleep at 2230. Alert, oriented and steady on feet before helping back in bed. Replaced tele monitor and O2. Advised on bed alarm and patient refused stating "Well I've been getting up to pee, I don't want that alarm on me." Advised for safety reasons and still refused, denies previous falls.Will closely monitor and re-access need.

## 2020-09-19 LAB — BETA-2-GLYCOPROTEIN I ABS, IGG/M/A
Beta-2 Glyco I IgG: <9 GPI IgG units (ref 0–20)
Beta-2-Glycoprotein I IgA: 121 GPI IgA units — ABNORMAL HIGH (ref 0–25)
Beta-2-Glycoprotein I IgM: <9 GPI IgM units (ref 0–32)

## 2020-09-21 LAB — CARDIOLIPIN ANTIBODIES, IGG, IGM, IGA
Anticardiolipin IgA: <9 APL U/mL (ref 0–11)
Anticardiolipin IgG: <9 GPL U/mL (ref 0–14)
Anticardiolipin IgM: 54 MPL U/mL — ABNORMAL HIGH (ref 0–12)

## 2020-09-21 LAB — FACTOR 5 LEIDEN

## 2020-09-22 DIAGNOSIS — E871 Hypo-osmolality and hyponatremia: Secondary | ICD-10-CM | POA: Diagnosis not present

## 2020-09-22 DIAGNOSIS — I2690 Septic pulmonary embolism without acute cor pulmonale: Secondary | ICD-10-CM | POA: Diagnosis not present

## 2020-09-22 DIAGNOSIS — I2602 Saddle embolus of pulmonary artery with acute cor pulmonale: Secondary | ICD-10-CM | POA: Diagnosis not present

## 2020-09-22 LAB — PROTHROMBIN GENE MUTATION

## 2020-09-29 ENCOUNTER — Encounter (HOSPITAL_COMMUNITY): Payer: Self-pay | Admitting: Hematology

## 2020-09-29 ENCOUNTER — Other Ambulatory Visit: Payer: Self-pay

## 2020-09-29 ENCOUNTER — Inpatient Hospital Stay (HOSPITAL_COMMUNITY): Payer: BC Managed Care – PPO

## 2020-09-29 ENCOUNTER — Inpatient Hospital Stay (HOSPITAL_COMMUNITY): Payer: BC Managed Care – PPO | Attending: Hematology | Admitting: Hematology

## 2020-09-29 VITALS — BP 116/76 | HR 72 | Temp 96.9°F | Resp 18 | Ht 66.0 in | Wt 157.7 lb

## 2020-09-29 DIAGNOSIS — Z818 Family history of other mental and behavioral disorders: Secondary | ICD-10-CM | POA: Diagnosis not present

## 2020-09-29 DIAGNOSIS — Z801 Family history of malignant neoplasm of trachea, bronchus and lung: Secondary | ICD-10-CM | POA: Insufficient documentation

## 2020-09-29 DIAGNOSIS — Z823 Family history of stroke: Secondary | ICD-10-CM | POA: Insufficient documentation

## 2020-09-29 DIAGNOSIS — M5137 Other intervertebral disc degeneration, lumbosacral region: Secondary | ICD-10-CM | POA: Diagnosis not present

## 2020-09-29 DIAGNOSIS — J432 Centrilobular emphysema: Secondary | ICD-10-CM | POA: Insufficient documentation

## 2020-09-29 DIAGNOSIS — Z79899 Other long term (current) drug therapy: Secondary | ICD-10-CM | POA: Diagnosis not present

## 2020-09-29 DIAGNOSIS — R200 Anesthesia of skin: Secondary | ICD-10-CM | POA: Insufficient documentation

## 2020-09-29 DIAGNOSIS — Z7289 Other problems related to lifestyle: Secondary | ICD-10-CM | POA: Diagnosis not present

## 2020-09-29 DIAGNOSIS — R059 Cough, unspecified: Secondary | ICD-10-CM | POA: Insufficient documentation

## 2020-09-29 DIAGNOSIS — G479 Sleep disorder, unspecified: Secondary | ICD-10-CM | POA: Diagnosis not present

## 2020-09-29 DIAGNOSIS — M549 Dorsalgia, unspecified: Secondary | ICD-10-CM | POA: Diagnosis not present

## 2020-09-29 DIAGNOSIS — Z87891 Personal history of nicotine dependence: Secondary | ICD-10-CM | POA: Diagnosis not present

## 2020-09-29 DIAGNOSIS — M48061 Spinal stenosis, lumbar region without neurogenic claudication: Secondary | ICD-10-CM | POA: Insufficient documentation

## 2020-09-29 DIAGNOSIS — I2699 Other pulmonary embolism without acute cor pulmonale: Secondary | ICD-10-CM | POA: Diagnosis not present

## 2020-09-29 DIAGNOSIS — M069 Rheumatoid arthritis, unspecified: Secondary | ICD-10-CM | POA: Insufficient documentation

## 2020-09-29 DIAGNOSIS — Z803 Family history of malignant neoplasm of breast: Secondary | ICD-10-CM | POA: Diagnosis not present

## 2020-09-29 DIAGNOSIS — Z7901 Long term (current) use of anticoagulants: Secondary | ICD-10-CM | POA: Insufficient documentation

## 2020-09-29 DIAGNOSIS — M4316 Spondylolisthesis, lumbar region: Secondary | ICD-10-CM | POA: Diagnosis not present

## 2020-09-29 DIAGNOSIS — I700 Atherosclerosis of aorta: Secondary | ICD-10-CM | POA: Diagnosis not present

## 2020-09-29 DIAGNOSIS — Z9049 Acquired absence of other specified parts of digestive tract: Secondary | ICD-10-CM | POA: Diagnosis not present

## 2020-09-29 DIAGNOSIS — I2694 Multiple subsegmental pulmonary emboli without acute cor pulmonale: Secondary | ICD-10-CM

## 2020-09-29 DIAGNOSIS — K449 Diaphragmatic hernia without obstruction or gangrene: Secondary | ICD-10-CM | POA: Diagnosis not present

## 2020-09-29 DIAGNOSIS — M47819 Spondylosis without myelopathy or radiculopathy, site unspecified: Secondary | ICD-10-CM | POA: Diagnosis not present

## 2020-09-29 DIAGNOSIS — Z8349 Family history of other endocrine, nutritional and metabolic diseases: Secondary | ICD-10-CM | POA: Diagnosis not present

## 2020-09-29 DIAGNOSIS — J90 Pleural effusion, not elsewhere classified: Secondary | ICD-10-CM | POA: Insufficient documentation

## 2020-09-29 DIAGNOSIS — R339 Retention of urine, unspecified: Secondary | ICD-10-CM

## 2020-09-29 LAB — PSA: Prostatic Specific Antigen: 3.02 ng/mL (ref 0.00–4.00)

## 2020-09-29 LAB — CORTISOL: Cortisol, Plasma: 12.8 ug/dL

## 2020-09-29 LAB — TSH: TSH: 0.954 u[IU]/mL (ref 0.350–4.500)

## 2020-09-29 NOTE — Progress Notes (Signed)
[facility] [address] [place] [address]   CLINIC:  Medical Oncology/Hematology  Patient Care Team: Pablo Lawrence, NP as PCP - General (Adult Health Nurse Practitioner)  CHIEF COMPLAINTS/PURPOSE OF CONSULTATION:  Evaluation of pulmonary embolism  HISTORY OF PRESENTING ILLNESS:  Micheal Serrano 65 y.o. male is here because of evaluation of pulmonary embolism, at the request of Dr. Heath Lark from [facility]. He presented to [place] [facility] on 04/05 with worsening pleuritic right-sided CP and was found to have bilateral pulmonary emboli without peripheral DVT's identified. He was started on Lovenox and transitioned to Eliquis 10 mg BID for 1 week, then on to 5 mg BID.  Today he is accompanied by his wife and he reports feeling okay. He reports that prior to his arrival to [facility], he was having trouble sleeping for 4-5 nights due to left lower back pain which he attributed to a pulled muscle after rearranging furniture; he denied having CP or SOB at that time. He denies having any inactivity, surgeries, extended road trips prior to being diagnosed with the PE. He denies having any history of DVT's or PE's. He is currently taking Eliquis 5 mg BID and he notes feeling more tired which is progressively worsening for several years, though he attributes it to the Eliquis. He also reports having tiredness even when he's not working over the weekend. He has RA in both hips with the left hip hurting more than right and was on Embrel for 3 months managed by Marella Chimes at [place], now he is on Humira since the beginning of March 2022. He also reports having difficulty urinating. He denies having any hematochezia, recent infections, F/C, or weight loss recently.  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 He quit smoking on 09/13/2020 after smoking 1 PPD for 25 years; he reports having  periods of smoking cessation previously. His mother and sister had breast cancer, both diagnosed in late 49's-early 50's; his MGM had lung cancer; he denies having family history of blood clots or lupus anticoagulant or miscarriages.   MEDICAL HISTORY:  Past Medical History:  Diagnosis Date  . Alcohol abuse   . GERD (gastroesophageal reflux disease)   . Hepatitis C    states this has been treated and is clear  . Hypertension   . Multiple subsegmental pulmonary emboli without acute cor pulmonale ([place]) 09/14/2020  . Neck pain     SURGICAL HISTORY: Past Surgical History:  Procedure Laterality Date  . CATARACT EXTRACTION W/PHACO Right 12/26/2019   Procedure: CATARACT EXTRACTION PHACO AND INTRAOCULAR LENS PLACEMENT RIGHT EYE;  Surgeon: Baruch Goldmann, MD;  Location: [facility];  Service: Ophthalmology;  Laterality: Right;  CDE: 11.65  . CERVICAL FUSION  06/2013  . CHOLECYSTECTOMY    . COLONOSCOPY N/A 01/30/2013   Procedure: COLONOSCOPY;  Surgeon: Rogene Houston, MD;  Location: [facility];  Service: Endoscopy;  Laterality: N/A;  100-moved to 12:00 Ann to notify pt  . HERNIA REPAIR    . KNEE ARTHROSCOPY Right   . LUMBAR LAMINECTOMY/DECOMPRESSION MICRODISCECTOMY Left 06/03/2014   Procedure: LUMBAR FIVE-SACRAL ONE LUMBAR LAMINECTOMY/DECOMPRESSION MICRODISCECTOMY 1 LEVEL;  Surgeon: Hosie Spangle, MD;  Location: [place] [facility] ORS;  Service: Neurosurgery;  Laterality: Left;  Left L5S1 laminotomy and microdiskectomy  . VASECTOMY      SOCIAL HISTORY: Social History   Socioeconomic History  . Marital status: Married  Spouse name: Not on file  . Number of children: 0  . Years of education: Not on file  . Highest education level: Not on file  Occupational History  . Not on file  Tobacco Use  . Smoking status: Former Smoker    Packs/day: 1.00    Years: 25.00    Pack years: 25.00    Types: Cigarettes    Quit date: 09/13/2020    Years since quitting: 0.0  . Smokeless tobacco: Never Used  .  Tobacco comment: 1 pack a day x  x 30 yrs  Vaping Use  . Vaping Use: Never used  Substance and Sexual Activity  . Alcohol use: Yes    Alcohol/week: 14.0 standard drinks    Types: 14 Cans of beer per week    Comment: 2-3 beers a day  . Drug use: No  . Sexual activity: Not Currently  Other Topics Concern  . Not on file  Social History Narrative  . Not on file   Social Determinants of Health   Financial Resource Strain: Low Risk   . Difficulty of Paying Living Expenses: Not hard at all  Food Insecurity: No Food Insecurity  . Worried About Charity fundraiser in the Last Year: Never true  . Ran Out of Food in the Last Year: Never true  Transportation Needs: No Transportation Needs  . Lack of Transportation (Medical): No  . Lack of Transportation (Non-Medical): No  Physical Activity: Sufficiently Active  . Days of Exercise per Week: 7 days  . Minutes of Exercise per Session: 60 min  Stress: No Stress Concern Present  . Feeling of Stress : Not at all  Social Connections: Socially Integrated  . Frequency of Communication with Friends and Family: More than three times a week  . Frequency of Social Gatherings with Friends and Family: More than three times a week  . Attends Religious Services: 1 to 4 times per year  . Active Member of Clubs or Organizations: Yes  . Attends Archivist Meetings: Never  . Marital Status: Married  Human resources officer Violence: Not At Risk  . Fear of Current or Ex-Partner: No  . Emotionally Abused: No  . Physically Abused: No  . Sexually Abused: No    FAMILY HISTORY: Family History  Problem Relation Age of Onset  . Breast cancer Mother   . Thyroid disease Mother   . Stroke Mother   . Dementia Father   . Colon cancer Neg Hx     ALLERGIES:  has No Known Allergies.  MEDICATIONS:  Current Outpatient Medications  Medication Sig Dispense Refill  . acetaminophen (TYLENOL) 325 MG tablet Take 650 mg by mouth every 6 (six) hours as needed for  mild pain.    Marland Kitchen apixaban (ELIQUIS) 5 MG TABS tablet Take 1 tablet (5 mg total) by mouth 2 (two) times daily. 237 tablet 1  . folic acid (FOLVITE) 1 MG tablet Take 1 tablet (1 mg total) by mouth daily. 30 tablet 0  . HUMIRA PEN 40 MG/0.4ML PNKT Inject 40 mg into the skin every 14 (fourteen) days.    Marland Kitchen olmesartan (BENICAR) 40 MG tablet Take 40 mg by mouth daily.    Marland Kitchen oxyCODONE (OXY IR/ROXICODONE) 5 MG immediate release tablet Take 1 tablet (5 mg total) by mouth every 6 (six) hours as needed for moderate pain or breakthrough pain. 10 tablet 0  . senna-docusate (SENOKOT-S) 8.6-50 MG tablet Take 1 tablet by mouth 2 (two) times daily for 20 days.  40 tablet 0  . thiamine 100 MG tablet Take 1 tablet (100 mg total) by mouth daily. 30 tablet 0  . apixaban (ELIQUIS) 5 MG TABS tablet Take 2 tablets (10 mg total) by mouth 2 (two) times daily for 7 days. 28 tablet 0  . tiZANidine (ZANAFLEX) 4 MG tablet Take 1 tablet (4 mg total) by mouth every 8 (eight) hours as needed for muscle spasms (Medication cause sedation. Avoid driving.). (Patient not taking: Reported on 09/29/2020) 30 tablet 0   No current facility-administered medications for this visit.    REVIEW OF SYSTEMS:   Review of Systems  Constitutional: Positive for appetite change (75%) and fatigue (50% & worsening). Negative for chills, fever and unexpected weight change.  Respiratory: Positive for cough (needing to clear throat).   Cardiovascular: Negative for leg swelling.  Gastrointestinal: Negative for blood in stool.  Genitourinary: Positive for difficulty urinating (urinary retention).   Musculoskeletal: Positive for back pain (5/10 R hip & lower back pain).  Neurological: Positive for numbness (numbness in L anterior thigh).  Psychiatric/Behavioral: Positive for sleep disturbance (d/t lower back pain).  All other systems reviewed and are negative.    PHYSICAL EXAMINATION: ECOG PERFORMANCE STATUS: 1 - Symptomatic but completely  ambulatory  Vitals:   09/29/20 0816  BP: 116/76  Pulse: 72  Resp: 18  Temp: (!) 96.9 F (36.1 C)  SpO2: 94%   Filed Weights   09/29/20 0816  Weight: 157 lb 11.2 oz (71.5 kg)   Physical Exam Vitals reviewed.  Constitutional:      Appearance: Normal appearance.  Cardiovascular:     Rate and Rhythm: Normal rate and regular rhythm.     Pulses: Normal pulses.     Heart sounds: Normal heart sounds.  Pulmonary:     Effort: Pulmonary effort is normal.     Breath sounds: Normal breath sounds.  Chest:  Breasts:     Right: No axillary adenopathy or supraclavicular adenopathy.     Left: No axillary adenopathy or supraclavicular adenopathy.    Abdominal:     Palpations: Abdomen is soft. There is no hepatomegaly, splenomegaly or mass.     Tenderness: There is no abdominal tenderness.     Hernia: No hernia is present.  Musculoskeletal:     Lumbar back: Bony tenderness (R sacroiliac joint tenderness) present.     Right lower leg: No edema.     Left lower leg: No edema.  Lymphadenopathy:     Cervical: No cervical adenopathy.     Upper Body:     Right upper body: No supraclavicular, axillary, pectoral or epitrochlear adenopathy.     Left upper body: No supraclavicular, axillary, pectoral or epitrochlear adenopathy.     Lower Body: No right inguinal adenopathy. No left inguinal adenopathy.  Skin:    Findings: No rash.  Neurological:     General: No focal deficit present.     Mental Status: He is alert and oriented to person, place, and time.  Psychiatric:        Mood and Affect: Mood normal.        Behavior: Behavior normal.      LABORATORY DATA:  I have reviewed the data as listed Recent Results (from the past 2160 hour(s))  Basic metabolic panel     Status: Abnormal   Collection Time: 09/17/20  5:28 AM  Result Value Ref Range   Sodium 134 (L) 135 - 145 mmol/L   Potassium 3.5 3.5 - 5.1 mmol/L   Chloride 100  98 - 111 mmol/L   CO2 22 22 - 32 mmol/L   Glucose, Bld 105  (H) 70 - 99 mg/dL    Comment: Glucose reference range applies only to samples taken after fasting for at least 8 hours.   BUN 6 (L) 8 - 23 mg/dL   Creatinine, Ser 0.65 0.61 - 1.24 mg/dL   Calcium 8.5 (L) 8.9 - 10.3 mg/dL   GFR, Estimated >60 >60 mL/min    Comment: (NOTE) Calculated using the CKD-EPI Creatinine Equation (2021)    Anion gap 12 5 - 15    Comment: Performed at [facility], [address], [place], [place] [address]  Magnesium     Status: None   Collection Time: 09/17/20  5:28 AM  Result Value Ref Range   Magnesium 1.9 1.7 - 2.4 mg/dL    Comment: Performed at [facility], [address], [place], [place] [address]  CBC     Status: Abnormal   Collection Time: 09/18/20  5:43 AM  Result Value Ref Range   WBC 10.6 (H) 4.0 - 10.5 K/uL   RBC 5.41 4.22 - 5.81 MIL/uL   Hemoglobin 17.8 (H) 13.0 - 17.0 g/dL   HCT 53.2 (H) 39.0 - 52.0 %   MCV 98.3 80.0 - 100.0 fL   MCH 32.9 26.0 - 34.0 pg   MCHC 33.5 30.0 - 36.0 g/dL   RDW 11.9 11.5 - 15.5 %   Platelets 219 150 - 400 K/uL   nRBC 0.0 0.0 - 0.2 %    Comment: Performed at [facility], [address], [place], [place] [address]    RADIOGRAPHIC STUDIES: I have personally reviewed the radiological images as listed and agreed with the findings in the report. DG Lumbar Spine Complete  Result Date: 09/13/2020 CLINICAL DATA:  Localized lumbar pain after moving furniture. EXAM: LUMBAR SPINE - COMPLETE 4+ VIEW COMPARISON:  April 22, 2019 FINDINGS: Lumbar spinal alignment is maintained. Vertebral body heights are normal. No sign of fracture. Approximately 3 mm retrolisthesis of T12 on L1. Similar mild retrolisthesis of L1 on L2. Not definitely seen on previous examinations. Disc space narrowing, moderate at T12-L1, L1-L2 with mild disc space narrowing at L2-3, L3-4 and L4-5. Marked disc space narrowing and associated sclerosis of the endplates of L5 and S1. Facet arthropathy greatest at L4-5 and L5-S1. IMPRESSION: 1. Multilevel degenerative  disc disease greatest at L5-S1. 2. Mild retrolisthesis of T12 on L1 and L1 on L2, not seen on the previous exam and associated with some slight increase in disc space narrowing at these levels since the prior study from November of 2020. Electronically Signed   By: Zetta Bills M.D.   On: 09/13/2020 12:54   CT Angio Chest PE W and/or Wo Contrast  Result Date: 09/14/2020 CLINICAL DATA:  Right-sided chest pain and shortness of breath, high probability for PE EXAM: CT ANGIOGRAPHY CHEST WITH CONTRAST TECHNIQUE: Multidetector CT imaging of the chest was performed using the standard protocol during bolus administration of intravenous contrast. Multiplanar CT image reconstructions and MIPs were obtained to evaluate the vascular anatomy. CONTRAST:  132mL OMNIPAQUE IOHEXOL 350 MG/ML SOLN COMPARISON:  None. FINDINGS: Cardiovascular: Positive for central filling defect within segmental arteries of the right and left lower lobes. No evidence of right heart strain. Main pulmonary artery is normal in caliber. The aorta is normal in caliber. Scattered atherosclerotic vascular calcifications. Multifocal coronary artery calcifications. No pericardial effusion. Mediastinum/Nodes: Unremarkable CT appearance of the thyroid gland. No suspicious mediastinal or hilar adenopathy.  No soft tissue mediastinal mass. Small hiatal hernia. Lungs/Pleura: Mild centrilobular pulmonary emphysema. Diffuse bilateral lower lobe bronchial wall thickening. Dependent atelectasis bilaterally. Mild ground-glass attenuation airspace opacity in the inferior right lower lobe in the region of the pulmonary embolus likely representing small pulmonary infarct/reperfusion injury. Trace right pleural effusion. Upper Abdomen: No acute abnormality. Musculoskeletal: No acute osseous abnormality. Review of the MIP images confirms the above findings. IMPRESSION: 1. Positive for acute bilateral lower lobe segmental pulmonary emboli. 2. Mild ground-glass attenuation  airspace opacity in the posteroinferior right lower lobe in the region of acute PE likely representing early pulmonary infarction/reperfusion injury. 3. Trace right pleural effusion. 4. Small hiatal hernia. 5. Aortic atherosclerosis and mild centrilobular pulmonary emphysema. 6. Diffuse bilateral lower lobe bronchial wall thickening most consistent with chronic bronchitis. Aortic Atherosclerosis (ICD10-I70.0) and Emphysema (ICD10-J43.9). Electronically Signed   By: Jacqulynn Cadet M.D.   On: 09/14/2020 10:22   US Venous Img Lower Bilateral (DVT)  Result Date: 09/15/2020 CLINICAL DATA:  Shortness of breath EXAM: BILATERAL LOWER EXTREMITY VENOUS DOPPLER ULTRASOUND TECHNIQUE: Gray-scale sonography with graded compression, as well as color Doppler and duplex ultrasound were performed to evaluate the lower extremity deep venous systems from the level of the common femoral vein and including the common femoral, femoral, profunda femoral, popliteal and calf veins including the posterior tibial, peroneal and gastrocnemius veins when visible. The superficial great saphenous vein was also interrogated. Spectral Doppler was utilized to evaluate flow at rest and with distal augmentation maneuvers in the common femoral, femoral and popliteal veins. COMPARISON:  None. FINDINGS: RIGHT LOWER EXTREMITY Common Femoral Vein: No evidence of thrombus. Normal compressibility, respiratory phasicity and response to augmentation. Saphenofemoral Junction: No evidence of thrombus. Normal compressibility and flow on color Doppler imaging. Profunda Femoral Vein: No evidence of thrombus. Normal compressibility and flow on color Doppler imaging. Femoral Vein: No evidence of thrombus. Normal compressibility, respiratory phasicity and response to augmentation. Popliteal Vein: No evidence of thrombus. Normal compressibility, respiratory phasicity and response to augmentation. Calf Veins: No evidence of thrombus. Normal compressibility and flow  on color Doppler imaging. Superficial Great Saphenous Vein: No evidence of thrombus. Normal compressibility. Venous Reflux:  None. Other Findings:  None. LEFT LOWER EXTREMITY Common Femoral Vein: No evidence of thrombus. Normal compressibility, respiratory phasicity and response to augmentation. Saphenofemoral Junction: No evidence of thrombus. Normal compressibility and flow on color Doppler imaging. Profunda Femoral Vein: No evidence of thrombus. Normal compressibility and flow on color Doppler imaging. Femoral Vein: No evidence of thrombus. Normal compressibility, respiratory phasicity and response to augmentation. Popliteal Vein: No evidence of thrombus. Normal compressibility, respiratory phasicity and response to augmentation. Calf Veins: No evidence of thrombus. Normal compressibility and flow on color Doppler imaging. Superficial Great Saphenous Vein: No evidence of thrombus. Normal compressibility. Venous Reflux:  None. Other Findings:  None. IMPRESSION: No evidence of deep venous thrombosis. Electronically Signed   By: Jacqulynn Cadet M.D.   On: 09/15/2020 11:07   DG Chest Port 1 View  Result Date: 09/14/2020 CLINICAL DATA:  Pain EXAM: PORTABLE CHEST 1 VIEW COMPARISON:  December 12, 2014 FINDINGS: There is atelectatic change in the left base. There is underlying interstitial thickening which is stable. No consolidation apparent. Heart is upper normal in size with pulmonary vascularity normal. There is aortic atherosclerosis. There is postoperative change in the lower cervical spine. There is midthoracic dextroscoliosis. IMPRESSION: Left base atelectasis. No appreciable edema or consolidation. Underlying interstitial thickening may indicate changes of chronic bronchitis. Heart upper normal in size.  Aortic Atherosclerosis (ICD10-I70.0). Electronically Signed   By: Lowella Grip III M.D.   On: 09/14/2020 08:25   ECHOCARDIOGRAM COMPLETE  Result Date: 09/15/2020    ECHOCARDIOGRAM REPORT   Patient Name:    BRAYANT DORR Date of Exam: 09/15/2020 Medical Rec #:  [id number]       Height:       66.0 in Accession #:    [id number]      Weight:       159.0 lb Date of Birth:  1956-01-14      BSA:          1.814 m Patient Age:    43 years        BP:           120/77 mmHg Patient Gender: M               HR:           73 bpm. Exam Location:  [facility] Procedure: 2D Echo Indications:    Chest Pain R07.9  History:        Patient has prior history of Echocardiogram examinations, most                 recent 12/13/2014. Signs/Symptoms:Chest Pain; Risk Factors:Current                 Smoker and Hypertension. Pulmonary Embolism, ETOH, GERD.  Sonographer:    Leavy Cella RDCS (AE) Referring Phys: [place]  1. Left ventricular ejection fraction, by estimation, is 60 to 65%. The left ventricle has normal function. The left ventricle has no regional wall motion abnormalities. Left ventricular diastolic parameters are indeterminate.  2. Right ventricular systolic function is normal. The right ventricular size is normal.  3. The mitral valve is normal in structure. No evidence of mitral valve regurgitation. No evidence of mitral stenosis.  4. The aortic valve is tricuspid. Aortic valve regurgitation is not visualized. No aortic stenosis is present.  5. The inferior vena cava is normal in size with greater than 50% respiratory variability, suggesting right atrial pressure of 3 mmHg. FINDINGS  Left Ventricle: Left ventricular ejection fraction, by estimation, is 60 to 65%. The left ventricle has normal function. The left ventricle has no regional wall motion abnormalities. The left ventricular internal cavity size was normal in size. There is  no left ventricular hypertrophy. Left ventricular diastolic parameters are indeterminate. Right Ventricle: The right ventricular size is normal. No increase in right ventricular wall thickness. Right ventricular systolic function is normal. Left Atrium: Left atrial size was  normal in size. Right Atrium: Right atrial size was normal in size. Pericardium: There is no evidence of pericardial effusion. Mitral Valve: The mitral valve is normal in structure. No evidence of mitral valve regurgitation. No evidence of mitral valve stenosis. Tricuspid Valve: The tricuspid valve is normal in structure. Tricuspid valve regurgitation is not demonstrated. No evidence of tricuspid stenosis. Aortic Valve: The aortic valve is tricuspid. Aortic valve regurgitation is not visualized. No aortic stenosis is present. Aortic valve mean gradient measures 2.6 mmHg. Aortic valve peak gradient measures 4.8 mmHg. Aortic valve area, by VTI measures 2.66 cm. Pulmonic Valve: The pulmonic valve was not well visualized. Pulmonic valve regurgitation is not visualized. No evidence of pulmonic stenosis. Aorta: The aortic root is normal in size and structure. Pulmonary Artery: Indeterminant PASP, inadequate TR jet. Venous: The inferior vena cava is normal in size with greater than 50% respiratory variability, suggesting right atrial  pressure of 3 mmHg. IAS/Shunts: No atrial level shunt detected by color flow Doppler.  LEFT VENTRICLE PLAX 2D LVIDd:         4.25 cm  Diastology LVIDs:         2.72 cm  LV e' medial:    5.44 cm/s LV PW:         0.90 cm  LV E/e' medial:  10.3 LV IVS:        0.90 cm  LV e' lateral:   8.70 cm/s LVOT diam:     2.10 cm  LV E/e' lateral: 6.5 LV SV:         56 LV SV Index:   31 LVOT Area:     3.46 cm  RIGHT VENTRICLE RV S prime:     19.40 cm/s TAPSE (M-mode): 2.4 cm LEFT ATRIUM             Index       RIGHT ATRIUM           Index LA diam:        3.90 cm 2.15 cm/m  RA Area:     17.30 cm LA Vol (A2C):   58.7 ml 32.36 ml/m RA Volume:   44.60 ml  24.59 ml/m LA Vol (A4C):   28.2 ml 15.55 ml/m LA Biplane Vol: 42.5 ml 23.43 ml/m  AORTIC VALVE AV Area (Vmax):    2.27 cm AV Area (Vmean):   2.09 cm AV Area (VTI):     2.66 cm AV Vmax:           109.37 cm/s AV Vmean:          76.931 cm/s AV VTI:             0.209 m AV Peak Grad:      4.8 mmHg AV Mean Grad:      2.6 mmHg LVOT Vmax:         71.65 cm/s LVOT Vmean:        46.508 cm/s LVOT VTI:          0.160 m LVOT/AV VTI ratio: 0.77  AORTA Ao Root diam: 3.60 cm MITRAL VALVE MV Area (PHT): 4.10 cm    SHUNTS MV Decel Time: 185 msec    Systemic VTI:  0.16 m MV E velocity: 56.30 cm/s  Systemic Diam: 2.10 cm MV A velocity: 37.20 cm/s MV E/A ratio:  1.51 Carlyle Dolly MD Electronically signed by Carlyle Dolly MD Signature Date/Time: 09/15/2020/11:28:59 AM    Final     ASSESSMENT:  1.  Unprovoked bilateral lower segmental pulmonary embolism without acute cor pulmonale: - Presentation to the ER with right lower back pain which started 3 to 4 days prior. - CT angio chest PE on 09/14/2020 positive for bilateral lower lobe segmental pulmonary emboli. - Currently on Eliquis twice daily tolerating it reasonably well. - No DVT on initial ultrasound. - Colonoscopy in 2014, sigmoid diverticulosis and 2 small polyps removed. - Denies any B symptoms.  No prior history of DVT or PE.  2.  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 - Quit smoking on 09/13/2020.  Smoked 1 pack/day for 25 years. - No family history of DVT or PE.  No lupus anticoagulant.  Mom and sister had breast cancer in their late 22s.  Maternal grandmother had lung cancer.  No miscarriages.  3.  Rheumatoid arthritis: - This involves bilateral hips, left hip more than right hip. - Is currently on Humira  every 2 [place] for the last 3 months.  He used Enbrel which did not work.   PLAN:  1.  Unprovoked bilateral PE: - He is otherwise very active and by history appears to have unprovoked bilateral pulmonary embolism and requires long-term anticoagulation. - Because of his slightly elevated white count and hemoglobin, I have recommended checking for JAK2 [id number] mutation.  We will also send serum erythropoietin level. - Because of family history, we will also check  PSA. - RTC 2 to 3 [place] for follow-up.  2.  Fatigue: - He reported fatigue in the afternoons for the last few months. - We will check TSH, testosterone levels and serum cortisol.    All questions were answered. The patient knows to call the clinic with any problems, questions or concerns.   Derek Jack, MD 09/29/20 9:22 AM  [place] [phone]   I, Milinda Antis, am acting as a scribe for Dr. Sanda Linger.  I, Derek Jack MD, have reviewed the above documentation for accuracy and completeness, and I agree with the above.

## 2020-09-29 NOTE — Patient Instructions (Addendum)
[place] at [facility] Discharge Instructions  You were seen and examined today by Dr. Delton Coombes. Dr. Delton Coombes is a hematologist, meaning he specializes in blood disorders. Dr. Delton Coombes discussed your past medical history, family history of blood disorders/cancers and the events that led to you being here today.  You were referred to Dr. Delton Coombes due to a blood clot that has been identified lungs. There were no blood clots identified in your lungs - this is good news! Dr. Delton Coombes has recommended some additional blood work to check your hormone levels, to see if a hormone imbalance is contributing to your fatigue. Dr. Delton Coombes will also check your JAK2 level, this mutation results in an increased hemoglobin and would place you at a higher risk for developing blood clots.  This blood clot appears to be unprovoked, your blood work that was done in the hospital was not conclusive. Continue the Eliquis that you were started on. Because this was unprovoked, you may need to be on the Eliquis indefinitely.  Follow-up as scheduled.   Thank you for choosing [place] at [facility] to provide your oncology and hematology care.  To afford each patient quality time with our provider, please arrive at least 15 minutes before your scheduled appointment time.   If you have a lab appointment with the [facility] please come in thru the Main Entrance and check in at the main information desk.  You need to re-schedule your appointment should you arrive 10 or more minutes late.  We strive to give you quality time with our providers, and arriving late affects you and other patients whose appointments are after yours.  Also, if you no show three or more times for appointments you may be dismissed from the clinic at the providers discretion.     Again, thank you for choosing [facility].  Our hope is that these requests will decrease the amount  of time that you wait before being seen by our physicians.       _____________________________________________________________  Should you have questions after your visit to [facility], please contact our office at [phone] and follow the prompts.  Our office hours are 8:00 a.m. and 4:30 p.m. Monday - Friday.  Please note that voicemails left after 4:00 p.m. may not be returned until the following business day.  We are closed weekends and major holidays.  You do have access to a nurse 24-7, just call the main number to the clinic [phone] and do not press any options, hold on the line and a nurse will answer the phone.    For prescription refill requests, have your pharmacy contact our office and allow 72 hours.    Due to Covid, you will need to wear a mask upon entering the hospital. If you do not have a mask, a mask will be given to you at the Main Entrance upon arrival. For doctor visits, patients may have 1 support person age 66 or older with them. For treatment visits, patients can not have anyone with them due to social distancing guidelines and our immunocompromised population.

## 2020-09-30 LAB — ERYTHROPOIETIN: Erythropoietin: 3.5 m[IU]/mL (ref 2.6–18.5)

## 2020-09-30 LAB — TESTOSTERONE: Testosterone: 384 ng/dL (ref 264–916)

## 2020-10-13 LAB — CALR + JAK2 E12-15 + MPL (REFLEXED)

## 2020-10-13 LAB — JAK2 V617F, W REFLEX TO CALR/E12/MPL

## 2020-10-18 ENCOUNTER — Ambulatory Visit (HOSPITAL_COMMUNITY): Payer: BC Managed Care – PPO | Admitting: Hematology

## 2020-10-20 ENCOUNTER — Ambulatory Visit (HOSPITAL_COMMUNITY): Payer: BC Managed Care – PPO | Admitting: Hematology

## 2020-10-22 ENCOUNTER — Inpatient Hospital Stay (HOSPITAL_COMMUNITY): Payer: BC Managed Care – PPO | Attending: Physician Assistant | Admitting: Physician Assistant

## 2020-10-22 ENCOUNTER — Other Ambulatory Visit: Payer: Self-pay

## 2020-10-22 VITALS — BP 103/69 | HR 83 | Temp 97.0°F | Resp 18 | Wt 154.3 lb

## 2020-10-22 DIAGNOSIS — D751 Secondary polycythemia: Secondary | ICD-10-CM | POA: Insufficient documentation

## 2020-10-22 DIAGNOSIS — M069 Rheumatoid arthritis, unspecified: Secondary | ICD-10-CM | POA: Insufficient documentation

## 2020-10-22 DIAGNOSIS — Z7289 Other problems related to lifestyle: Secondary | ICD-10-CM | POA: Insufficient documentation

## 2020-10-22 DIAGNOSIS — Z818 Family history of other mental and behavioral disorders: Secondary | ICD-10-CM | POA: Diagnosis not present

## 2020-10-22 DIAGNOSIS — R5383 Other fatigue: Secondary | ICD-10-CM | POA: Diagnosis not present

## 2020-10-22 DIAGNOSIS — Z87891 Personal history of nicotine dependence: Secondary | ICD-10-CM | POA: Insufficient documentation

## 2020-10-22 DIAGNOSIS — Z7901 Long term (current) use of anticoagulants: Secondary | ICD-10-CM | POA: Diagnosis not present

## 2020-10-22 DIAGNOSIS — Z823 Family history of stroke: Secondary | ICD-10-CM | POA: Diagnosis not present

## 2020-10-22 DIAGNOSIS — I2699 Other pulmonary embolism without acute cor pulmonale: Secondary | ICD-10-CM | POA: Insufficient documentation

## 2020-10-22 DIAGNOSIS — Z8042 Family history of malignant neoplasm of prostate: Secondary | ICD-10-CM | POA: Insufficient documentation

## 2020-10-22 DIAGNOSIS — Z9049 Acquired absence of other specified parts of digestive tract: Secondary | ICD-10-CM | POA: Diagnosis not present

## 2020-10-22 DIAGNOSIS — Z801 Family history of malignant neoplasm of trachea, bronchus and lung: Secondary | ICD-10-CM | POA: Insufficient documentation

## 2020-10-22 DIAGNOSIS — Z8349 Family history of other endocrine, nutritional and metabolic diseases: Secondary | ICD-10-CM | POA: Diagnosis not present

## 2020-10-22 DIAGNOSIS — Z79899 Other long term (current) drug therapy: Secondary | ICD-10-CM | POA: Insufficient documentation

## 2020-10-22 DIAGNOSIS — Z803 Family history of malignant neoplasm of breast: Secondary | ICD-10-CM | POA: Diagnosis not present

## 2020-10-22 DIAGNOSIS — I2694 Multiple subsegmental pulmonary emboli without acute cor pulmonale: Secondary | ICD-10-CM | POA: Diagnosis not present

## 2020-10-22 DIAGNOSIS — D72829 Elevated white blood cell count, unspecified: Secondary | ICD-10-CM | POA: Insufficient documentation

## 2020-10-22 DIAGNOSIS — R768 Other specified abnormal immunological findings in serum: Secondary | ICD-10-CM | POA: Insufficient documentation

## 2020-10-22 NOTE — Patient Instructions (Signed)
[place] at [facility] Discharge Instructions  You were seen today by Tarri Abernethy PA-C for your pulmonary embolisms and elevated hemoglobin.     LABS: Return in 6 months for labs   MEDICATIONS: Continue Eliquis 5 mg twice daily  FOLLOW-UP APPOINTMENT: 6 months   Thank you for choosing [place] at [facility] to provide your oncology and hematology care.  To afford each patient quality time with our provider, please arrive at least 15 minutes before your scheduled appointment time.   If you have a lab appointment with the [place] please come in thru the Main Entrance and check in at the main information desk.  You need to re-schedule your appointment should you arrive 10 or more minutes late.  We strive to give you quality time with our providers, and arriving late affects you and other patients whose appointments are after yours.  Also, if you no show three or more times for appointments you may be dismissed from the clinic at the providers discretion.     Again, thank you for choosing [facility].  Our hope is that these requests will decrease the amount of time that you wait before being seen by our physicians.       _____________________________________________________________  Should you have questions after your visit to [facility], please contact our office at [phone] and follow the prompts.  Our office hours are 8:00 a.m. and 4:30 p.m. Monday - Friday.  Please note that voicemails left after 4:00 p.m. may not be returned until the following business day.  We are closed weekends and major holidays.  You do have access to a nurse 24-7, just call the main number to the clinic [phone] and do not press any options, hold on the line and a nurse will answer the phone.    For prescription refill requests, have your pharmacy contact our office and allow 72 hours.    Due to Covid, you will need  to wear a mask upon entering the hospital. If you do not have a mask, a mask will be given to you at the Main Entrance upon arrival. For doctor visits, patients may have 1 support person age 12 or older with them. For treatment visits, patients can not have anyone with them due to social distancing guidelines and our immunocompromised population.

## 2020-10-22 NOTE — Progress Notes (Signed)
[place], [place] [address]   CLINIC:  Medical Oncology/Hematology  PCP:  Pablo [place], NP [place] [address] [phone]   REASON FOR VISIT:  Follow-up for pulmonary embolism and erythrocytosis  PRIOR THERAPY: None  CURRENT THERAPY: Eliquis  INTERVAL HISTORY:  Micheal Serrano 65 y.o. male returns for routine follow-up of pulmonary embolism and elevated hemoglobin.  He was last seen by Dr. Delton Coombes on 09/29/2020, and returns today to discuss results of work-up initiated at that appointment.  Micheal Serrano was diagnosed with unprovoked lateral pulmonary embolisms on 09/14/2020, venous duplex negative for DVTs.  He initially required 12 L of supplemental oxygen, but was able to be discharged home without supplemental oxygen.   He was started on Eliquis during hospitalization.  He reports that he has been doing fairly well since he was released from the hospital. He denies leg swelling, pain, and erythema.  No shortness of breath, dyspnea on exertion, chest pain, cough, hemoptysis, or palpitations.  He has not noticed any major bleeding while on Eliquis.  No epistaxis, hematemesis, hemoptysis, hematochezia, or melena.  He has noted easy bleeding of his gums since starting Eliquis, and was recommended to follow-up with dentist.    At his last visit, he was noted to have erythrocytosis, with chronically elevated hemoglobin dating back to at least 2008.  Work-up was negative for genetic driver mutation and so diagnosis favors secondary polycythemia.  Patient is a former smoker who quit smoking at the time of his pulmonary embolism (09/13/2020).  He denies any known history of COPD, lung disease, or sleep apnea.  No vasomotor symptoms, erythromelalgia's, or aquagenic pruritus.  He is chronically itchy related to dry skin and eczema, but this is not particularly worse after shower.  He remains fatigued. He has 30% energy and 100%  appetite. He endorses that he is maintaining a stable weight.  Of note, the patient's mother passed away unexpectedly last week, and he has understandably been dealing with significant grief as a result of this.   REVIEW OF SYSTEMS:  Review of Systems  Constitutional: Positive for fatigue. Negative for appetite change, chills, diaphoresis, fever and unexpected weight change.  HENT:   Negative for lump/mass and nosebleeds.   Eyes: Negative for eye problems.  Respiratory: Negative for cough, hemoptysis and shortness of breath.   Cardiovascular: Negative for chest pain, leg swelling and palpitations.  Gastrointestinal: Negative for abdominal pain, blood in stool, constipation, diarrhea, nausea and vomiting.  Genitourinary: Positive for difficulty urinating (Residual urine, incomplete bladder emptying). Negative for hematuria.   Skin: Negative.   Neurological: Negative for dizziness, headaches and light-headedness.  Hematological: Does not bruise/bleed easily.      PAST MEDICAL/SURGICAL HISTORY:  Past Medical History:  Diagnosis Date  . Alcohol abuse   . GERD (gastroesophageal reflux disease)   . Hepatitis C    states this has been treated and is clear  . Hypertension   . Multiple subsegmental pulmonary emboli without acute cor pulmonale ([place]) 09/14/2020  . Neck pain    Past Surgical History:  Procedure Laterality Date  . CATARACT EXTRACTION W/PHACO Right 12/26/2019   Procedure: CATARACT EXTRACTION PHACO AND INTRAOCULAR LENS PLACEMENT RIGHT EYE;  Surgeon: Baruch Goldmann, MD;  Location: [facility] ORS;  Service: Ophthalmology;  Laterality: Right;  CDE: 11.65  . CERVICAL FUSION  06/2013  . CHOLECYSTECTOMY    . COLONOSCOPY N/A 01/30/2013   Procedure: COLONOSCOPY;  Surgeon: Mechele Dawley  Laural Golden, MD;  Location: [facility] ENDO SUITE;  Service: Endoscopy;  Laterality: N/A;  100-moved to 12:00 Ann to notify pt  . HERNIA REPAIR    . KNEE ARTHROSCOPY Right   . LUMBAR LAMINECTOMY/DECOMPRESSION MICRODISCECTOMY  Left 06/03/2014   Procedure: LUMBAR FIVE-SACRAL ONE LUMBAR LAMINECTOMY/DECOMPRESSION MICRODISCECTOMY 1 LEVEL;  Surgeon: Hosie Spangle, MD;  Location: [facility] NEURO ORS;  Service: Neurosurgery;  Laterality: Left;  Left L5S1 laminotomy and microdiskectomy  . VASECTOMY       SOCIAL HISTORY:  Social History   Socioeconomic History  . Marital status: Married    Spouse name: Not on file  . Number of children: 0  . Years of education: Not on file  . Highest education level: Not on file  Occupational History  . Not on file  Tobacco Use  . Smoking status: Former Smoker    Packs/day: 1.00    Years: 25.00    Pack years: 25.00    Types: Cigarettes    Quit date: 09/13/2020    Years since quitting: 0.1  . Smokeless tobacco: Never Used  . Tobacco comment: 1 pack a day x  x 30 yrs  Vaping Use  . Vaping Use: Never used  Substance and Sexual Activity  . Alcohol use: Yes    Alcohol/week: 14.0 standard drinks    Types: 14 Cans of beer per week    Comment: 2-3 beers a day  . Drug use: No  . Sexual activity: Not Currently  Other Topics Concern  . Not on file  Social History Narrative  . Not on file   Social Determinants of Health   Financial Resource Strain: Low Risk   . Difficulty of Paying Living Expenses: Not hard at all  Food Insecurity: No Food Insecurity  . Worried About Charity fundraiser in the Last Year: Never true  . Ran Out of Food in the Last Year: Never true  Transportation Needs: No Transportation Needs  . Lack of Transportation (Medical): No  . Lack of Transportation (Non-Medical): No  Physical Activity: Sufficiently Active  . Days of Exercise per Week: 7 days  . Minutes of Exercise per Session: 60 min  Stress: No Stress Concern Present  . Feeling of Stress : Not at all  Social Connections: Socially Integrated  . Frequency of Communication with Friends and Family: More than three times a week  . Frequency of Social Gatherings with Friends and Family: More than three  times a week  . Attends Religious Services: 1 to 4 times per year  . Active Member of Clubs or Organizations: Yes  . Attends Archivist Meetings: Never  . Marital Status: Married  Human resources officer Violence: Not At Risk  . Fear of Current or Ex-Partner: No  . Emotionally Abused: No  . Physically Abused: No  . Sexually Abused: No    FAMILY HISTORY:  Family History  Problem Relation Age of Onset  . Breast cancer Mother   . Thyroid disease Mother   . Stroke Mother   . Dementia Father   . Colon cancer Neg Hx     CURRENT MEDICATIONS:  Outpatient Encounter Medications as of 10/22/2020  Medication Sig Note  . acetaminophen (TYLENOL) 325 MG tablet Take 650 mg by mouth every 6 (six) hours as needed for mild pain.   Marland Kitchen apixaban (ELIQUIS) 5 MG TABS tablet Take 2 tablets (10 mg total) by mouth 2 (two) times daily for 7 days.   Marland Kitchen apixaban (ELIQUIS) 5 MG TABS tablet Take  1 tablet (5 mg total) by mouth 2 (two) times daily.   Marland Kitchen HUMIRA PEN 40 MG/0.4ML PNKT Inject 40 mg into the skin every 14 (fourteen) days. 09/14/2020: 2 Saturdays ago  . olmesartan (BENICAR) 40 MG tablet Take 40 mg by mouth daily.   Marland Kitchen oxyCODONE (OXY IR/ROXICODONE) 5 MG immediate release tablet Take 1 tablet (5 mg total) by mouth every 6 (six) hours as needed for moderate pain or breakthrough pain.   Marland Kitchen tiZANidine (ZANAFLEX) 4 MG tablet Take 1 tablet (4 mg total) by mouth every 8 (eight) hours as needed for muscle spasms (Medication cause sedation. Avoid driving.). (Patient not taking: Reported on 09/29/2020)    No facility-administered encounter medications on file as of 10/22/2020.    ALLERGIES:  No Known Allergies   PHYSICAL EXAM:  ECOG PERFORMANCE STATUS: 1 - Symptomatic but completely ambulatory  Vitals:   10/22/20 1105  BP: 103/69  Pulse: 83  Resp: 18  Temp: (!) 97 F (36.1 C)  SpO2: 99%   Filed Weights   10/22/20 1105  Weight: 154 lb 5.2 oz (70 kg)   Physical Exam Constitutional:      Appearance:  Normal appearance.  HENT:     Head: Normocephalic and atraumatic.     Mouth/Throat:     Mouth: Mucous membranes are moist.  Eyes:     Extraocular Movements: Extraocular movements intact.     Pupils: Pupils are equal, round, and reactive to light.  Cardiovascular:     Rate and Rhythm: Normal rate and regular rhythm.     Pulses: Normal pulses.     Heart sounds: Normal heart sounds.  Pulmonary:     Effort: Pulmonary effort is normal.     Breath sounds: Normal breath sounds.  Abdominal:     General: Bowel sounds are normal.     Palpations: Abdomen is soft.     Tenderness: There is no abdominal tenderness.  Musculoskeletal:        General: No swelling.     Right lower leg: No edema.     Left lower leg: No edema.  Lymphadenopathy:     Cervical: No cervical adenopathy.  Skin:    General: Skin is warm and dry.     Comments: Dry skin, patches of eczema  Neurological:     General: No focal deficit present.     Mental Status: He is alert and oriented to person, place, and time.  Psychiatric:        Mood and Affect: Mood normal.        Behavior: Behavior normal.      LABORATORY DATA:  I have reviewed the labs as listed.  CBC    Component Value Date/Time   WBC 10.6 (H) 09/18/2020 0543   RBC 5.41 09/18/2020 0543   HGB 17.8 (H) 09/18/2020 0543   HCT 53.2 (H) 09/18/2020 0543   PLT 219 09/18/2020 0543   MCV 98.3 09/18/2020 0543   MCH 32.9 09/18/2020 0543   MCHC 33.5 09/18/2020 0543   RDW 11.9 09/18/2020 0543   LYMPHSABS 2.0 09/14/2020 0825   MONOABS 1.6 (H) 09/14/2020 0825   EOSABS 0.1 09/14/2020 0825   BASOSABS 0.1 09/14/2020 0825   CMP Latest Ref Rng & Units 09/17/2020 09/16/2020 09/15/2020  Glucose 70 - 99 mg/dL 105(H) 90 103(H)  BUN 8 - 23 mg/dL 6(L) 8 11  Creatinine 0.61 - 1.24 mg/dL 0.65 0.65 0.74  Sodium 135 - 145 mmol/L 134(L) 130(L) 127(L)  Potassium 3.5 - 5.1 mmol/L 3.5  3.7 3.7  Chloride 98 - 111 mmol/L 100 98 96(L)  CO2 22 - 32 mmol/L 22 20(L) 18(L)  Calcium 8.9 -  10.3 mg/dL 8.5(L) 8.7(L) 8.4(L)  Total Protein 6.5 - 8.1 g/dL - - -  Total Bilirubin 0.3 - 1.2 mg/dL - - -  Alkaline Phos 38 - 126 U/L - - -  AST 15 - 41 U/L - - -  ALT 0 - 44 U/L - - -    DIAGNOSTIC IMAGING:  I have independently reviewed the relevant imaging and discussed with the patient.   ASSESSMENT:  1.  Unprovoked bilateral lower segmental pulmonary embolism without acute cor pulmonale: - Presentation to the ER with right lower back pain which started 3 to 4 days prior. - CT angio chest PE on 09/14/2020 positive for bilateral lower lobe segmental pulmonary emboli. - Currently on Eliquis twice daily tolerating it reasonably well - advised to follow-up with dentist for bleeding gums - No DVT on venous duplex ultrasound of lower extremity (09/15/2020). - Colonoscopy in 2014, sigmoid diverticulosis and 2 small polyps removed. - Denies any B symptoms.  No prior history of DVT or PE. - Coagulopathy work-up performed in the hospital revealed no definitive cause clot - abnormalities noted in Antithrombin III, protein C, and lupus anticoagulant were likely skewed by concurrent anticoagulation; mild elevation in beta-2 glycoprotein 1 IgA at 121, mildly elevated IgM cardiolipin antibody at 54  2.  Mild leukocytosis - Intermittent leukocytosis noted from 2016 to present, ranging from normal to 14.4 - Most recent CBC on 09/18/2020 showed WBC 10.6, likely related to hospitalization - May also have an element of reactive leukocytosis due to smoking  3.  Erythrocytosis - Elevated hemoglobin noted from least 2008 onwards ranging from 14.4-19.7 - Most recent CBC 09/18/2020 with Hgb 17.8 - JAK2 with reflex was negative, erythropoietin within normal limits at 3.5 - Consider secondary polycythemia in the setting of tobacco use, only recently quit smoking on 09/13/2020 (latest blood work was from 09/18/2020)  4.  Family history of prostate cancer - Patient reports residual urine and some difficulty  urinating - PSA within normal limits at 3.02 - Patient encouraged to follow-up with urologist for likely BPH  5.  Fatigue - Patient has had ongoing fatigue for the past several months - TSH, testosterone, and serum cortisol were within normal limits  6.  Rheumatoid arthritis: - This involves bilateral hips, left hip more than right hip. - Is currently on Humira every 2 [place] for the last 3 months.  He used Enbrel which did not work.  7.  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 - Quit smoking on 09/13/2020.  Smoked 1 pack/day for 25 years. - No family history of DVT or PE.  No lupus anticoagulant.  Mom and sister had breast cancer in their late 42s.  Maternal grandmother had lung cancer.  No miscarriages.   PLAN:  1.  Unprovoked bilateral PE: - He is otherwise very active and by history appears to have unprovoked bilateral pulmonary embolism and requires long-term anticoagulation. -  Will repeat CBC and D-dimer in 6 months - RTC in 6 months for office visit and ongoing risk stratification of chronic anticoagulation  2.  Mild leukocytosis - Repeat CBC and RTC in 6 months  3.  Erythrocytosis - Repeat CBC and RTC in 6 months - If erythrocytosis has resolved, we can conclude that it was due to smoking (since patient quit smoking on 09/13/2020)  4.  Family history of prostate cancer - Recommended referral to urology for suspected BPH  5.  Fatigue - Continue to follow-up with PCP for work-up of other causes of chronic fatigue   PLAN SUMMARY & DISPOSITION: -Repeat CBC and D-dimer in 6 months - RTC in 6 months  All questions were answered. The patient knows to call the clinic with any problems, questions or concerns.  Medical decision making: Low  Time spent on visit: I spent 15 minutes counseling the patient face to face. The total time spent in the appointment was 25 minutes and more than 50% was on counseling.   Harriett Rush, PA-C   10/22/20 11:16 AM

## 2020-10-29 DIAGNOSIS — R768 Other specified abnormal immunological findings in serum: Secondary | ICD-10-CM | POA: Diagnosis not present

## 2020-10-29 DIAGNOSIS — M25551 Pain in right hip: Secondary | ICD-10-CM | POA: Diagnosis not present

## 2020-10-29 DIAGNOSIS — M25441 Effusion, right hand: Secondary | ICD-10-CM | POA: Diagnosis not present

## 2020-10-29 DIAGNOSIS — M0579 Rheumatoid arthritis with rheumatoid factor of multiple sites without organ or systems involvement: Secondary | ICD-10-CM | POA: Diagnosis not present

## 2020-11-05 DIAGNOSIS — J410 Simple chronic bronchitis: Secondary | ICD-10-CM | POA: Diagnosis not present

## 2020-11-05 DIAGNOSIS — G8929 Other chronic pain: Secondary | ICD-10-CM | POA: Insufficient documentation

## 2020-11-05 DIAGNOSIS — F17219 Nicotine dependence, cigarettes, with unspecified nicotine-induced disorders: Secondary | ICD-10-CM | POA: Insufficient documentation

## 2020-11-05 DIAGNOSIS — Z86711 Personal history of pulmonary embolism: Secondary | ICD-10-CM | POA: Diagnosis not present

## 2020-11-05 DIAGNOSIS — I700 Atherosclerosis of aorta: Secondary | ICD-10-CM | POA: Insufficient documentation

## 2020-12-09 DIAGNOSIS — M5416 Radiculopathy, lumbar region: Secondary | ICD-10-CM | POA: Diagnosis not present

## 2020-12-09 DIAGNOSIS — I10 Essential (primary) hypertension: Secondary | ICD-10-CM | POA: Diagnosis not present

## 2020-12-15 ENCOUNTER — Ambulatory Visit (HOSPITAL_COMMUNITY)
Admission: RE | Admit: 2020-12-15 | Discharge: 2020-12-15 | Disposition: A | Discharge status: Home / Self Care | Payer: BC Managed Care – PPO | Source: Ambulatory Visit | Attending: Adult Health Nurse Practitioner | Admitting: Adult Health Nurse Practitioner

## 2020-12-15 ENCOUNTER — Other Ambulatory Visit (HOSPITAL_COMMUNITY): Payer: Self-pay | Admitting: Adult Health Nurse Practitioner

## 2020-12-15 ENCOUNTER — Other Ambulatory Visit: Payer: Self-pay

## 2020-12-15 DIAGNOSIS — I2690 Septic pulmonary embolism without acute cor pulmonale: Secondary | ICD-10-CM | POA: Diagnosis not present

## 2020-12-15 DIAGNOSIS — M4726 Other spondylosis with radiculopathy, lumbar region: Secondary | ICD-10-CM | POA: Insufficient documentation

## 2020-12-15 DIAGNOSIS — S92031A Displaced avulsion fracture of tuberosity of right calcaneus, initial encounter for closed fracture: Secondary | ICD-10-CM | POA: Diagnosis not present

## 2020-12-15 DIAGNOSIS — M25571 Pain in right ankle and joints of right foot: Secondary | ICD-10-CM | POA: Diagnosis not present

## 2020-12-15 DIAGNOSIS — S82891A Other fracture of right lower leg, initial encounter for closed fracture: Secondary | ICD-10-CM | POA: Diagnosis not present

## 2020-12-15 DIAGNOSIS — S92025A Nondisplaced fracture of anterior process of left calcaneus, initial encounter for closed fracture: Secondary | ICD-10-CM | POA: Diagnosis not present

## 2021-01-03 DIAGNOSIS — S92021A Displaced fracture of anterior process of right calcaneus, initial encounter for closed fracture: Secondary | ICD-10-CM | POA: Diagnosis not present

## 2021-01-03 DIAGNOSIS — S93491A Sprain of other ligament of right ankle, initial encounter: Secondary | ICD-10-CM | POA: Diagnosis not present

## 2021-02-24 DIAGNOSIS — M5136 Other intervertebral disc degeneration, lumbar region: Secondary | ICD-10-CM | POA: Diagnosis not present

## 2021-02-24 DIAGNOSIS — M0579 Rheumatoid arthritis with rheumatoid factor of multiple sites without organ or systems involvement: Secondary | ICD-10-CM | POA: Diagnosis not present

## 2021-02-24 DIAGNOSIS — R768 Other specified abnormal immunological findings in serum: Secondary | ICD-10-CM | POA: Diagnosis not present

## 2021-04-15 ENCOUNTER — Inpatient Hospital Stay (HOSPITAL_COMMUNITY): Payer: BC Managed Care – PPO | Attending: Hematology

## 2021-04-15 DIAGNOSIS — H3530 Unspecified macular degeneration: Secondary | ICD-10-CM | POA: Insufficient documentation

## 2021-04-15 DIAGNOSIS — Z8349 Family history of other endocrine, nutritional and metabolic diseases: Secondary | ICD-10-CM | POA: Insufficient documentation

## 2021-04-15 DIAGNOSIS — Z7901 Long term (current) use of anticoagulants: Secondary | ICD-10-CM | POA: Insufficient documentation

## 2021-04-15 DIAGNOSIS — F17210 Nicotine dependence, cigarettes, uncomplicated: Secondary | ICD-10-CM | POA: Insufficient documentation

## 2021-04-15 DIAGNOSIS — M25552 Pain in left hip: Secondary | ICD-10-CM | POA: Insufficient documentation

## 2021-04-15 DIAGNOSIS — Z803 Family history of malignant neoplasm of breast: Secondary | ICD-10-CM | POA: Insufficient documentation

## 2021-04-15 DIAGNOSIS — Z9049 Acquired absence of other specified parts of digestive tract: Secondary | ICD-10-CM | POA: Insufficient documentation

## 2021-04-15 DIAGNOSIS — R5383 Other fatigue: Secondary | ICD-10-CM | POA: Insufficient documentation

## 2021-04-15 DIAGNOSIS — R42 Dizziness and giddiness: Secondary | ICD-10-CM | POA: Insufficient documentation

## 2021-04-15 DIAGNOSIS — D751 Secondary polycythemia: Secondary | ICD-10-CM | POA: Insufficient documentation

## 2021-04-15 DIAGNOSIS — D72829 Elevated white blood cell count, unspecified: Secondary | ICD-10-CM | POA: Insufficient documentation

## 2021-04-15 DIAGNOSIS — Z823 Family history of stroke: Secondary | ICD-10-CM | POA: Insufficient documentation

## 2021-04-15 DIAGNOSIS — Z818 Family history of other mental and behavioral disorders: Secondary | ICD-10-CM | POA: Insufficient documentation

## 2021-04-15 DIAGNOSIS — G629 Polyneuropathy, unspecified: Secondary | ICD-10-CM | POA: Insufficient documentation

## 2021-04-15 DIAGNOSIS — R768 Other specified abnormal immunological findings in serum: Secondary | ICD-10-CM | POA: Insufficient documentation

## 2021-04-15 DIAGNOSIS — H538 Other visual disturbances: Secondary | ICD-10-CM | POA: Insufficient documentation

## 2021-04-15 DIAGNOSIS — M25551 Pain in right hip: Secondary | ICD-10-CM | POA: Insufficient documentation

## 2021-04-15 DIAGNOSIS — Z86711 Personal history of pulmonary embolism: Secondary | ICD-10-CM | POA: Insufficient documentation

## 2021-04-15 DIAGNOSIS — Z801 Family history of malignant neoplasm of trachea, bronchus and lung: Secondary | ICD-10-CM | POA: Insufficient documentation

## 2021-04-20 DIAGNOSIS — D0462 Carcinoma in situ of skin of left upper limb, including shoulder: Secondary | ICD-10-CM | POA: Diagnosis not present

## 2021-04-20 DIAGNOSIS — C44722 Squamous cell carcinoma of skin of right lower limb, including hip: Secondary | ICD-10-CM | POA: Diagnosis not present

## 2021-04-20 DIAGNOSIS — L820 Inflamed seborrheic keratosis: Secondary | ICD-10-CM | POA: Diagnosis not present

## 2021-04-22 ENCOUNTER — Ambulatory Visit (HOSPITAL_COMMUNITY): Payer: BC Managed Care – PPO | Admitting: Physician Assistant

## 2021-04-22 ENCOUNTER — Other Ambulatory Visit: Payer: Self-pay

## 2021-04-22 ENCOUNTER — Other Ambulatory Visit (HOSPITAL_COMMUNITY): Payer: BC Managed Care – PPO

## 2021-04-22 ENCOUNTER — Inpatient Hospital Stay (HOSPITAL_COMMUNITY): Payer: BC Managed Care – PPO | Attending: Hematology

## 2021-04-22 DIAGNOSIS — D751 Secondary polycythemia: Secondary | ICD-10-CM | POA: Insufficient documentation

## 2021-04-22 DIAGNOSIS — I2694 Multiple subsegmental pulmonary emboli without acute cor pulmonale: Secondary | ICD-10-CM

## 2021-04-22 LAB — CBC WITH DIFFERENTIAL/PLATELET
Abs Immature Granulocytes: 0.03 10*3/uL (ref 0.00–0.07)
Basophils Absolute: 0.1 10*3/uL (ref 0.0–0.1)
Basophils Relative: 1 %
Eosinophils Absolute: 0.3 10*3/uL (ref 0.0–0.5)
Eosinophils Relative: 4 %
HCT: 52.7 % — ABNORMAL HIGH (ref 39.0–52.0)
Hemoglobin: 18.1 g/dL — ABNORMAL HIGH (ref 13.0–17.0)
Immature Granulocytes: 0 %
Lymphocytes Relative: 32 %
Lymphs Abs: 2.6 10*3/uL (ref 0.7–4.0)
MCH: 33.8 pg (ref 26.0–34.0)
MCHC: 34.3 g/dL (ref 30.0–36.0)
MCV: 98.5 fL (ref 80.0–100.0)
Monocytes Absolute: 1.1 10*3/uL — ABNORMAL HIGH (ref 0.1–1.0)
Monocytes Relative: 14 %
Neutro Abs: 3.8 10*3/uL (ref 1.7–7.7)
Neutrophils Relative %: 49 %
Platelets: 178 10*3/uL (ref 150–400)
RBC: 5.35 MIL/uL (ref 4.22–5.81)
RDW: 12.7 % (ref 11.5–15.5)
WBC: 7.9 10*3/uL (ref 4.0–10.5)
nRBC: 0 % (ref 0.0–0.2)

## 2021-04-22 LAB — D-DIMER, QUANTITATIVE: D-Dimer, Quant: 1.09 ug/mL-FEU — ABNORMAL HIGH (ref 0.00–0.50)

## 2021-04-29 ENCOUNTER — Ambulatory Visit (HOSPITAL_COMMUNITY): Payer: BC Managed Care – PPO | Admitting: Physician Assistant

## 2021-05-02 NOTE — Progress Notes (Signed)
[place], [place] [address]   CLINIC:  Medical Oncology/Hematology  PCP:  Pablo Lawrence, NP [address] [place] [address] [phone]   REASON FOR VISIT:  Follow-up for pulmonary embolism and erythrocytosis  PRIOR THERAPY: None  CURRENT THERAPY: Eliquis  INTERVAL HISTORY:  Micheal Serrano 65 y.o. male returns for routine follow-up of his pulmonary embolism and erythrocytosis.  He was last seen by Tarri Abernethy PA-C on 10/22/2020.  At today's visit, he reports feeling fair.  No recent hospitalizations, surgeries, or changes in baseline health status.  Regarding his history of pulmonary embolism, he continues to take Eliquis as prescribed.  He has not had any abnormal bleeding such as hematemesis, hematochezia, melena, or epistaxis.  He denies any current signs or symptoms of DVT such as unilateral leg swelling, pain, and erythema.  No symptoms of PE such as shortness of breath, dyspnea on exertion, chest pain, cough, hemoptysis, and palpitations.  Regarding his erythrocytosis, he had previously quit smoking in April 2022, but he started smoking again in 2020/11/11 after his mother passed away.  He is currently smoking about 10 cigarettes/day.  He is symptomatic with significant fatigue, occasional tingling in his hands and feet, and intermittent dizziness.  He denies any aquagenic pruritus, Raynaud's phenomenon, erythromelalgia, tinnitus, or strokelike symptoms.  He has chronic blurry vision from macular degeneration but denies any acute changes. No B symptoms such as fever, chills, night sweats, unintentional weight loss. No abdominal pain, nausea, vomiting, or early satiety.  He has little to no energy and 100% appetite. He endorses that he is maintaining a stable weight.    REVIEW OF SYSTEMS:  Review of Systems  Constitutional:  Positive for fatigue. Negative for appetite change, chills, diaphoresis, fever and unexpected  weight change.  HENT:   Negative for lump/mass and nosebleeds.   Eyes:  Negative for eye problems.  Respiratory:  Negative for cough, hemoptysis and shortness of breath.   Cardiovascular:  Negative for chest pain, leg swelling and palpitations.  Gastrointestinal:  Negative for abdominal pain, blood in stool, constipation, diarrhea, nausea and vomiting.  Genitourinary:  Negative for hematuria.   Musculoskeletal:  Positive for arthralgias (Bilateral hip pain from rheumatoid arthritis).  Skin: Negative.   Neurological:  Positive for dizziness and numbness. Negative for headaches and light-headedness.  Hematological:  Does not bruise/bleed easily.     PAST MEDICAL/SURGICAL HISTORY:  Past Medical History:  Diagnosis Date   Alcohol abuse    GERD (gastroesophageal reflux disease)    Hepatitis C    states this has been treated and is clear   Hypertension    Multiple subsegmental pulmonary emboli without acute cor pulmonale ([facility]) 09/14/2020   Neck pain    Past Surgical History:  Procedure Laterality Date   CATARACT EXTRACTION W/PHACO Right 12/26/2019   Procedure: CATARACT EXTRACTION PHACO AND INTRAOCULAR LENS PLACEMENT RIGHT EYE;  Surgeon: Baruch Goldmann, MD;  Location: [facility];  Service: Ophthalmology;  Laterality: Right;  CDE: 11.65   CERVICAL FUSION  06/2013   CHOLECYSTECTOMY     COLONOSCOPY N/A 01/30/2013   Procedure: COLONOSCOPY;  Surgeon: Rogene Houston, MD;  Location: [facility]ITE;  Service: Endoscopy;  Laterality: N/A;  100-moved to 12:00 Ann to notify pt   HERNIA REPAIR     KNEE ARTHROSCOPY Right    LUMBAR LAMINECTOMY/DECOMPRESSION MICRODISCECTOMY Left 06/03/2014   Procedure: LUMBAR FIVE-SACRAL ONE LUMBAR LAMINECTOMY/DECOMPRESSION MICRODISCECTOMY 1 LEVEL;  Surgeon: Okey Regal  Sherwood Gambler, MD;  Location: [facility];  Service: Neurosurgery;  Laterality: Left;  Left L5S1 laminotomy and microdiskectomy   VASECTOMY       SOCIAL HISTORY:  Social History   Socioeconomic History    Marital status: Married    Spouse name: Not on file   Number of children: 0   Years of education: Not on file   Highest education level: Not on file  Occupational History   Not on file  Tobacco Use   Smoking status: Former    Packs/day: 1.00    Years: 25.00    Pack years: 25.00    Types: Cigarettes    Quit date: 09/13/2020    Years since quitting: 0.6   Smokeless tobacco: Never   Tobacco comments:    1 pack a day x  x 30 yrs  Vaping Use   Vaping Use: Never used  Substance and Sexual Activity   Alcohol use: Yes    Alcohol/week: 14.0 standard drinks    Types: 14 Cans of beer per week    Comment: 2-3 beers a day   Drug use: No   Sexual activity: Not Currently  Other Topics Concern   Not on file  Social History Narrative   Not on file   Social Determinants of Health   Financial Resource Strain: Low Risk    Difficulty of Paying Living Expenses: Not hard at all  Food Insecurity: No Food Insecurity   Worried About Charity fundraiser in the Last Year: Never true   [place] in the Last Year: Never true  Transportation Needs: No Transportation Needs   Lack of Transportation (Medical): No   Lack of Transportation (Non-Medical): No  Physical Activity: Sufficiently Active   Days of Exercise per Week: 7 days   Minutes of Exercise per Session: 60 min  Stress: No Stress Concern Present   Feeling of Stress : Not at all  Social Connections: Socially Integrated   Frequency of Communication with Friends and Family: More than three times a week   Frequency of Social Gatherings with Friends and Family: More than three times a week   Attends Religious Services: 1 to 4 times per year   Active Member of [employer] or Organizations: Yes   Attends Archivist Meetings: Never   Marital Status: Married  Human resources officer Violence: Not At Risk   Fear of Current or Ex-Partner: No   Emotionally Abused: No   Physically Abused: No   Sexually Abused: No    FAMILY HISTORY:  Family  History  Problem Relation Age of Onset   Breast cancer Mother    Thyroid disease Mother    Stroke Mother    Dementia Father    Colon cancer Neg Hx     CURRENT MEDICATIONS:  Outpatient Encounter Medications as of 05/03/2021  Medication Sig Note   acetaminophen (TYLENOL) 325 MG tablet Take 650 mg by mouth every 6 (six) hours as needed for mild pain.    apixaban (ELIQUIS) 5 MG TABS tablet Take 1 tablet (5 mg total) by mouth 2 (two) times daily.    Cyanocobalamin (VITAMIN B-12 PO) Take by mouth.    FOLIC ACID PO Take by mouth.    HUMIRA PEN 40 MG/0.4ML PNKT Inject 40 mg into the skin every 14 (fourteen) days. 09/14/2020: 2 Saturdays ago   olmesartan (BENICAR) 40 MG tablet Take 40 mg by mouth daily.    Thiamine HCl (VITAMIN B-1 PO) Take by mouth.  No facility-administered encounter medications on file as of 05/03/2021.    ALLERGIES:  No Known Allergies   PHYSICAL EXAM:  ECOG PERFORMANCE STATUS: 1 - Symptomatic but completely ambulatory  There were no vitals filed for this visit. There were no vitals filed for this visit. Physical Exam Constitutional:      Appearance: Normal appearance.  HENT:     Head: Normocephalic and atraumatic.     Mouth/Throat:     Mouth: Mucous membranes are moist.  Eyes:     Extraocular Movements: Extraocular movements intact.     Pupils: Pupils are equal, round, and reactive to light.  Cardiovascular:     Rate and Rhythm: Normal rate and regular rhythm.     Pulses: Normal pulses.     Heart sounds: Normal heart sounds.  Pulmonary:     Effort: Pulmonary effort is normal.     Breath sounds: Normal breath sounds.  Abdominal:     General: Bowel sounds are normal.     Palpations: Abdomen is soft.     Tenderness: There is no abdominal tenderness.  Musculoskeletal:        General: No swelling.     Right lower leg: No edema.     Left lower leg: No edema.  Lymphadenopathy:     Cervical: No cervical adenopathy.  Skin:    General: Skin is warm and  dry.  Neurological:     General: No focal deficit present.     Mental Status: He is alert and oriented to person, place, and time.  Psychiatric:        Mood and Affect: Mood normal.        Behavior: Behavior normal.     LABORATORY DATA:  I have reviewed the labs as listed.  CBC    Component Value Date/Time   WBC 7.9 04/22/2021 0954   RBC 5.35 04/22/2021 0954   HGB 18.1 (H) 04/22/2021 0954   HCT 52.7 (H) 04/22/2021 0954   PLT 178 04/22/2021 0954   MCV 98.5 04/22/2021 0954   MCH 33.8 04/22/2021 0954   MCHC 34.3 04/22/2021 0954   RDW 12.7 04/22/2021 0954   LYMPHSABS 2.6 04/22/2021 0954   MONOABS 1.1 (H) 04/22/2021 0954   EOSABS 0.3 04/22/2021 0954   BASOSABS 0.1 04/22/2021 0954   CMP Latest Ref Rng & Units 09/17/2020 09/16/2020 09/15/2020  Glucose 70 - 99 mg/dL 105(H) 90 103(H)  BUN 8 - 23 mg/dL 6(L) 8 11  Creatinine 0.61 - 1.24 mg/dL 0.65 0.65 0.74  Sodium 135 - 145 mmol/L 134(L) 130(L) 127(L)  Potassium 3.5 - 5.1 mmol/L 3.5 3.7 3.7  Chloride 98 - 111 mmol/L 100 98 96(L)  CO2 22 - 32 mmol/L 22 20(L) 18(L)  Calcium 8.9 - 10.3 mg/dL 8.5(L) 8.7(L) 8.4(L)  Total Protein 6.5 - 8.1 g/dL - - -  Total Bilirubin 0.3 - 1.2 mg/dL - - -  Alkaline Phos 38 - 126 U/L - - -  AST 15 - 41 U/L - - -  ALT 0 - 44 U/L - - -    DIAGNOSTIC IMAGING:  I have independently reviewed the relevant imaging and discussed with the patient.  ASSESSMENT & PLAN: 1.  Unprovoked bilateral lower segmental pulmonary embolism without acute cor pulmonale: - Presentation to the ER on 09/14/2020 with right lower back pain which started 3 to 4 days prior. - CT angio chest PE on 09/14/2020 positive for bilateral lower lobe segmental pulmonary emboli. - No DVT on venous duplex ultrasound of lower  extremity (09/15/2020). - No obvious provoking factor was found.  No prior history of DVT or PE. - Coagulopathy work-up performed in the hospital revealed no definitive cause clot - abnormalities noted in Antithrombin III, protein  C, and lupus anticoagulant were likely skewed by concurrent anticoagulation; mild elevation in beta-2 glycoprotein 1 IgA at 121, mildly elevated IgM cardiolipin antibody at 54 - Most recent D-dimer (04/22/2021) mildly elevated at 1.09, but improved from previous - Currently on Eliquis twice daily tolerating it reasonably well without any major bleeding events. - No current symptoms of recurrent DVT/PE - PLAN: We have recommended indefinite anticoagulation due to apparently unprovoked bilateral pulmonary embolism. - Repeat CBC and D-dimer with RTC in 6 months.   2.  Erythrocytosis - Elevated hemoglobin noted from least 2008 onwards ranging from 14.4-19.7 - Unprovoked pulmonary embolism in April 2022 - JAK2 with reflex was negative, erythropoietin within normal limits at 3.5 - Suspected to be secondary polycythemia in setting of tobacco use, patient quit smoking as of 09/13/2020, but started smoking again on 10/17/2020.  Currently smoking about 0.5 PPD - Symptomatic with severe fatigue, intermittent neuropathy, and dizziness - Most recent labs (04/22/2021) Hgb 18.1/hematocrit 52.7 - PLAN: Monthly phlebotomy x2 due to symptoms noted above. - Encouraged smoking cessation. - Repeat CBC with phone visit in 4 months. - Have discussed that indications for phlebotomy in case of secondary polycythemia include hematocrit greater than 54 or severe symptoms such as strokelike symptoms, severe recurrent headaches, or severe fatigue.  3.  Mild leukocytosis - Intermittent leukocytosis noted from 2016 to present, ranging from normal to 14.4, suspected to be leukocytosis secondary to tobacco use - Patient quit smoking in April 2022, started again in May 2022 related to stress and grief after his mother passed away - Most recent WBC (04/22/2021) normal at 7.9 with unremarkable differential  4.  Family history of prostate cancer - Patient reports residual urine and some difficulty urinating - PSA within normal  limits at 3.02 - Patient encouraged to follow-up with urologist for likely BPH    5.  Fatigue  - Patient has had ongoing fatigue for the past several months - TSH, testosterone, and serum cortisol were within normal limits - Possibly related to secondary polycythemia/erythrocytosis   6.  Rheumatoid arthritis:  - This involves bilateral hips, left hip more than right hip. - He is currently on Humira every 2 [place] for the last 3 months.  He used Enbrel which did not work.   7.  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 - Quit smoking on 09/13/2020.  Smoked 1 pack/day for 25 years. - No family history of DVT or PE.  No lupus anticoagulant.  Mom and sister had breast cancer in their late 36s.  Maternal grandmother had lung cancer.  No miscarriages.    PLAN SUMMARY & DISPOSITION: Monthly phlebotomy x2 Labs in 4 months RTC after labs  All questions were answered. The patient knows to call the clinic with any problems, questions or concerns.  Medical decision making: Moderate  Time spent on visit: I spent 20 minutes counseling the patient face to face. The total time spent in the appointment was 30 minutes and more than 50% was on counseling.   Micheal Rush, PA-C  05/03/2021 6:39 PM

## 2021-05-03 ENCOUNTER — Other Ambulatory Visit: Payer: Self-pay

## 2021-05-03 ENCOUNTER — Inpatient Hospital Stay (HOSPITAL_BASED_OUTPATIENT_CLINIC_OR_DEPARTMENT_OTHER): Payer: BC Managed Care – PPO | Admitting: Physician Assistant

## 2021-05-03 ENCOUNTER — Encounter (HOSPITAL_COMMUNITY): Payer: Self-pay | Admitting: Physician Assistant

## 2021-05-03 VITALS — BP 117/85 | HR 76 | Temp 97.7°F | Resp 18 | Wt 154.6 lb

## 2021-05-03 DIAGNOSIS — H538 Other visual disturbances: Secondary | ICD-10-CM | POA: Diagnosis not present

## 2021-05-03 DIAGNOSIS — Z7901 Long term (current) use of anticoagulants: Secondary | ICD-10-CM | POA: Diagnosis not present

## 2021-05-03 DIAGNOSIS — Z818 Family history of other mental and behavioral disorders: Secondary | ICD-10-CM | POA: Diagnosis not present

## 2021-05-03 DIAGNOSIS — F17210 Nicotine dependence, cigarettes, uncomplicated: Secondary | ICD-10-CM | POA: Diagnosis not present

## 2021-05-03 DIAGNOSIS — R768 Other specified abnormal immunological findings in serum: Secondary | ICD-10-CM | POA: Diagnosis not present

## 2021-05-03 DIAGNOSIS — M25552 Pain in left hip: Secondary | ICD-10-CM | POA: Diagnosis not present

## 2021-05-03 DIAGNOSIS — Z801 Family history of malignant neoplasm of trachea, bronchus and lung: Secondary | ICD-10-CM | POA: Diagnosis not present

## 2021-05-03 DIAGNOSIS — Z8349 Family history of other endocrine, nutritional and metabolic diseases: Secondary | ICD-10-CM | POA: Diagnosis not present

## 2021-05-03 DIAGNOSIS — Z803 Family history of malignant neoplasm of breast: Secondary | ICD-10-CM | POA: Diagnosis not present

## 2021-05-03 DIAGNOSIS — R42 Dizziness and giddiness: Secondary | ICD-10-CM | POA: Diagnosis not present

## 2021-05-03 DIAGNOSIS — I2694 Multiple subsegmental pulmonary emboli without acute cor pulmonale: Secondary | ICD-10-CM | POA: Diagnosis not present

## 2021-05-03 DIAGNOSIS — H3530 Unspecified macular degeneration: Secondary | ICD-10-CM | POA: Diagnosis not present

## 2021-05-03 DIAGNOSIS — M25551 Pain in right hip: Secondary | ICD-10-CM | POA: Diagnosis not present

## 2021-05-03 DIAGNOSIS — R5383 Other fatigue: Secondary | ICD-10-CM | POA: Diagnosis not present

## 2021-05-03 DIAGNOSIS — D72829 Elevated white blood cell count, unspecified: Secondary | ICD-10-CM | POA: Diagnosis not present

## 2021-05-03 DIAGNOSIS — Z9049 Acquired absence of other specified parts of digestive tract: Secondary | ICD-10-CM | POA: Diagnosis not present

## 2021-05-03 DIAGNOSIS — D751 Secondary polycythemia: Secondary | ICD-10-CM | POA: Diagnosis not present

## 2021-05-03 DIAGNOSIS — Z86711 Personal history of pulmonary embolism: Secondary | ICD-10-CM | POA: Diagnosis not present

## 2021-05-03 DIAGNOSIS — Z823 Family history of stroke: Secondary | ICD-10-CM | POA: Diagnosis not present

## 2021-05-03 DIAGNOSIS — G629 Polyneuropathy, unspecified: Secondary | ICD-10-CM | POA: Diagnosis not present

## 2021-05-03 HISTORY — DX: Secondary polycythemia: D75.1

## 2021-05-03 NOTE — Patient Instructions (Signed)
[place] at [facility] Discharge Instructions  You were seen today by Tarri Abernethy PA-C for your history of pulmonary embolism (blood clots in your lungs) and your elevated hemoglobin and hematocrit.  Continue to take the Eliquis as prescribed for your history of pulmonary embolism.  You will need to be on this indefinitely.  Your elevated hemoglobin and hematocrit are most likely secondary to your tobacco use.  For this and for other health benefits, it is extremely important that you quit smoking.  Please see the attached handout for additional tips on smoking cessation.  Elevated hemoglobin and hematocrit can cause increased risk of blood clots, heart attack, and stroke.  It can also cause symptoms of fatigue, headache, and numbness and tingling.  To help decrease the symptoms, we will schedule you for phlebotomy.  This will be done here at the cancer center, where we will take 1 pint of blood to help decrease your blood counts to a safer level.  We will schedule this 1 time this month, and we will repeat it the next month if it helps your symptoms.  LABS: Return in 4 months for repeat labs  TREATMENT: Monthly phlebotomy x2 months  FOLLOW-UP APPOINTMENT: Office visit in 4 months, after labs   Thank you for choosing [place] at [facility] to provide your oncology and hematology care.  To afford each patient quality time with our provider, please arrive at least 15 minutes before your scheduled appointment time.   If you have a lab appointment with the [facility] please come in thru the Main Entrance and check in at the main information desk.  You need to re-schedule your appointment should you arrive 10 or more minutes late.  We strive to give you quality time with our providers, and arriving late affects you and other patients whose appointments are after yours.  Also, if you no show three or more times for appointments you may be  dismissed from the clinic at the providers discretion.     Again, thank you for choosing [facility].  Our hope is that these requests will decrease the amount of time that you wait before being seen by our physicians.       _____________________________________________________________  Should you have questions after your visit to [facility], please contact our office at [phone] and follow the prompts.  Our office hours are 8:00 a.m. and 4:30 p.m. Monday - Friday.  Please note that voicemails left after 4:00 p.m. may not be returned until the following business day.  We are closed weekends and major holidays.  You do have access to a nurse 24-7, just call the main number to the clinic [phone] and do not press any options, hold on the line and a nurse will answer the phone.    For prescription refill requests, have your pharmacy contact our office and allow 72 hours.    Due to Covid, you will need to wear a mask upon entering the hospital. If you do not have a mask, a mask will be given to you at the Main Entrance upon arrival. For doctor visits, patients may have 1 support person age 22 or older with them. For treatment visits, patients can not have anyone with them due to social distancing guidelines and our immunocompromised population.

## 2021-05-13 ENCOUNTER — Encounter (HOSPITAL_COMMUNITY): Payer: BC Managed Care – PPO

## 2021-05-23 ENCOUNTER — Encounter (HOSPITAL_COMMUNITY): Payer: BC Managed Care – PPO

## 2021-05-25 ENCOUNTER — Inpatient Hospital Stay (HOSPITAL_COMMUNITY): Payer: BC Managed Care – PPO

## 2021-05-26 ENCOUNTER — Inpatient Hospital Stay (HOSPITAL_COMMUNITY): Payer: BC Managed Care – PPO | Attending: Hematology

## 2021-05-26 ENCOUNTER — Encounter (HOSPITAL_COMMUNITY): Payer: Self-pay

## 2021-05-26 DIAGNOSIS — D751 Secondary polycythemia: Secondary | ICD-10-CM | POA: Insufficient documentation

## 2021-05-26 DIAGNOSIS — I2694 Multiple subsegmental pulmonary emboli without acute cor pulmonale: Secondary | ICD-10-CM | POA: Insufficient documentation

## 2021-05-26 NOTE — Patient Instructions (Signed)
[facility]  Discharge Instructions: Thank you for choosing [facility] to provide your oncology and hematology care.  If you have a lab appointment with the [facility], please come in thru the Main Entrance and check in at the main information desk.    We strive to give you quality time with your provider. You may need to reschedule your appointment if you arrive late (15 or more minutes).  Arriving late affects you and other patients whose appointments are after yours.  Also, if you miss three or more appointments without notifying the office, you may be dismissed from the clinic at the providers discretion.      For prescription refill requests, have your pharmacy contact our office and allow 72 hours for refills to be completed.       For any non-urgent questions, you may also contact your provider using MyChart. We now offer e-Visits for anyone 42 and older to request care online for non-urgent symptoms. For details visit [email or web address].   Also download the MyChart app! Go to the app store, search "MyChart", open the app, select [facility], and log in with your MyChart username and password.  Due to Covid, a mask is required upon entering the hospital/clinic. If you do not have a mask, one will be given to you upon arrival. For doctor visits, patients may have 1 support person aged 55 or older with them. For treatment visits, patients cannot have anyone with them due to current Covid guidelines and our immunocompromised population.

## 2021-05-26 NOTE — Progress Notes (Signed)
[place] [facility] presents today for phlebotomy per MD orders. Phlebotomy procedure started a 1410 and ended at 1422. 500 grams removed. Patient observed for 10 minutes after procedure without any incident. Patient tolerated procedure well. IV needle removed intact.

## 2021-06-16 ENCOUNTER — Encounter (HOSPITAL_COMMUNITY): Payer: BC Managed Care – PPO

## 2021-07-01 ENCOUNTER — Other Ambulatory Visit: Payer: Self-pay

## 2021-07-01 ENCOUNTER — Inpatient Hospital Stay (HOSPITAL_COMMUNITY): Payer: BC Managed Care – PPO | Attending: Hematology

## 2021-07-01 DIAGNOSIS — D751 Secondary polycythemia: Secondary | ICD-10-CM | POA: Insufficient documentation

## 2021-07-01 DIAGNOSIS — I2694 Multiple subsegmental pulmonary emboli without acute cor pulmonale: Secondary | ICD-10-CM | POA: Insufficient documentation

## 2021-07-01 NOTE — Patient Instructions (Signed)
[facility]  Discharge Instructions: Thank you for choosing [facility] to provide your oncology and hematology care.  If you have a lab appointment with the [facility], please come in thru the Main Entrance and check in at the main information desk.  Wear comfortable clothing and clothing appropriate for easy access to any Portacath or PICC line.   We strive to give you quality time with your provider. You may need to reschedule your appointment if you arrive late (15 or more minutes).  Arriving late affects you and other patients whose appointments are after yours.  Also, if you miss three or more appointments without notifying the office, you may be dismissed from the clinic at the providers discretion.      For prescription refill requests, have your pharmacy contact our office and allow 72 hours for refills to be completed.    Today you received the following chemotherapy and/or immunotherapy agents Phlebotomy      To help prevent nausea and vomiting after your treatment, we encourage you to take your nausea medication as directed.  BELOW ARE SYMPTOMS THAT SHOULD BE REPORTED IMMEDIATELY: *FEVER GREATER THAN 100.4 F (38 C) OR HIGHER *CHILLS OR SWEATING *NAUSEA AND VOMITING THAT IS NOT CONTROLLED WITH YOUR NAUSEA MEDICATION *UNUSUAL SHORTNESS OF BREATH *UNUSUAL BRUISING OR BLEEDING *URINARY PROBLEMS (pain or burning when urinating, or frequent urination) *BOWEL PROBLEMS (unusual diarrhea, constipation, pain near the anus) TENDERNESS IN MOUTH AND THROAT WITH OR WITHOUT PRESENCE OF ULCERS (sore throat, sores in mouth, or a toothache) UNUSUAL RASH, SWELLING OR PAIN  UNUSUAL VAGINAL DISCHARGE OR ITCHING   Items with * indicate a potential emergency and should be followed up as soon as possible or go to the Emergency Department if any problems should occur.  Please show the CHEMOTHERAPY ALERT CARD or IMMUNOTHERAPY ALERT CARD at check-in to the Emergency  Department and triage nurse.  Should you have questions after your visit or need to cancel or reschedule your appointment, please contact [facility] [phone]  and follow the prompts.  Office hours are 8:00 a.m. to 4:30 p.m. Monday - Friday. Please note that voicemails left after 4:00 p.m. may not be returned until the following business day.  We are closed weekends and major holidays. You have access to a nurse at all times for urgent questions. Please call the main number to the clinic [phone] and follow the prompts.  For any non-urgent questions, you may also contact your provider using MyChart. We now offer e-Visits for anyone 65 and older to request care online for non-urgent symptoms. For details visit [email or web address].   Also download the MyChart app! Go to the app store, search "MyChart", open the app, select [place], and log in with your MyChart username and password.  Due to Covid, a mask is required upon entering the hospital/clinic. If you do not have a mask, one will be given to you upon arrival. For doctor visits, patients may have 1 support person aged 65 or older with them. For treatment visits, patients cannot have anyone with them due to current Covid guidelines and our immunocompromised population.

## 2021-07-01 NOTE — Progress Notes (Signed)
Patient presents today for phlebotomy per MD orders. Phlebotomy procedure started at 1025 and ended at 1035. 500 cc removed. Patient tolerated procedure well. Procedure tolerated well and without incident. Discharged ambulatory in stable condition.

## 2021-08-26 ENCOUNTER — Inpatient Hospital Stay (HOSPITAL_COMMUNITY): Payer: BC Managed Care – PPO | Attending: Hematology

## 2021-09-02 ENCOUNTER — Ambulatory Visit (HOSPITAL_COMMUNITY): Payer: BC Managed Care – PPO | Admitting: Physician Assistant

## 2021-09-13 ENCOUNTER — Encounter (HOSPITAL_COMMUNITY): Payer: Self-pay | Admitting: *Deleted

## 2021-09-13 ENCOUNTER — Emergency Department (HOSPITAL_COMMUNITY)
Admission: EM | Admit: 2021-09-13 | Discharge: 2021-09-13 | Disposition: A | Discharge status: Home / Self Care | Payer: BC Managed Care – PPO | Attending: Emergency Medicine | Admitting: Emergency Medicine

## 2021-09-13 ENCOUNTER — Emergency Department (HOSPITAL_COMMUNITY): Payer: BC Managed Care – PPO

## 2021-09-13 DIAGNOSIS — Z7901 Long term (current) use of anticoagulants: Secondary | ICD-10-CM | POA: Insufficient documentation

## 2021-09-13 DIAGNOSIS — S6991XA Unspecified injury of right wrist, hand and finger(s), initial encounter: Secondary | ICD-10-CM | POA: Diagnosis not present

## 2021-09-13 DIAGNOSIS — M7989 Other specified soft tissue disorders: Secondary | ICD-10-CM | POA: Diagnosis not present

## 2021-09-13 DIAGNOSIS — W228XXA Striking against or struck by other objects, initial encounter: Secondary | ICD-10-CM | POA: Insufficient documentation

## 2021-09-13 MED ORDER — PREDNISONE 50 MG PO TABS: ORAL_TABLET | ORAL | 0 refills | Status: DC

## 2021-09-13 MED ORDER — HYDROCODONE-ACETAMINOPHEN 5-325 MG PO TABS: 2.0000 | ORAL_TABLET | Freq: Four times a day (QID) | ORAL | 0 refills | Status: AC | PRN

## 2021-09-13 MED ORDER — PREDNISONE 50 MG PO TABS: 60.0000 mg | ORAL_TABLET | Freq: Every day | ORAL | Status: DC

## 2021-09-13 NOTE — Discharge Instructions (Addendum)
See your Physician for recheck if pain persist ?

## 2021-09-13 NOTE — ED Triage Notes (Signed)
Pain and swelling right hand onset last night ?

## 2021-09-14 NOTE — ED Provider Notes (Signed)
?[facility] ?Provider Note ? ? ?CSN: [id number] ?Arrival date & time: 09/13/21  1644 ? ?  ? ?History ? ?Chief Complaint  ?Patient presents with  ? Hand Injury  ? ? ?Micheal Serrano is a 66 y.o. male. ? ?Pt reports he knuckle bumped with a friend yesterday and now has swelling and pain in his hand.  Pt thiks he may have gout.  ? ?The history is provided by the patient. No language interpreter was used.  ?Hand Injury ?Location:  Hand ?Hand location:  R hand ?Injury: no   ?Pain details:  ?  Quality:  Aching ?  Radiates to:  Does not radiate ?  Severity:  Moderate ?  Duration:  1 day ?  Timing:  Constant ?  Progression:  Worsening ?Dislocation: no   ?Prior injury to area:  Yes ?Associated symptoms: no decreased range of motion   ? ?  ? ?Home Medications ?Prior to Admission medications   ?Medication Sig Start Date End Date Taking? Authorizing Provider  ?HYDROcodone-acetaminophen (NORCO/VICODIN) 5-325 MG tablet Take 2 tablets by mouth every 6 (six) hours as needed for moderate pain. 09/13/21 09/13/22 Yes Fransico Meadow, PA-C  ?predniSONE (DELTASONE) 50 MG tablet One tablet a day 09/13/21  Yes Fransico Meadow, PA-C  ?acetaminophen (TYLENOL) 325 MG tablet Take 650 mg by mouth every 6 (six) hours as needed for mild pain.    [provider]  ?apixaban (ELIQUIS) 5 MG TABS tablet Take 1 tablet (5 mg total) by mouth 2 (two) times daily. 09/25/20 12/24/20  Heath Lark D, DO  ?Cyanocobalamin (VITAMIN B-12 PO) Take by mouth.    [provider]  ?folic acid (FOLVITE) 1 MG tablet Take 1 mg by mouth daily. 05/16/21   [provider]  ?HUMIRA PEN 40 MG/0.4ML PNKT Inject 40 mg into the skin every 14 (fourteen) days. 08/16/20   [provider]  ?olmesartan (BENICAR) 40 MG tablet Take 40 mg by mouth daily.    [provider]  ?thiamine 100 MG tablet Take 100 mg by mouth daily. 12/28/20   [provider]  ?   ? ?Allergies    ?Patient has no known allergies.   ? ?Review of  Systems   ?Review of Systems  ?All other systems reviewed and are negative. ? ?Physical Exam ?Updated Vital Signs ?BP 114/78   Pulse 77   Temp 97.9 ?F (36.6 ?C) (Oral)   Resp 16   Ht '5\' 6"'$  (1.676 m)   Wt 70.3 kg   SpO2 96%   BMI 25.02 kg/m?  ?Physical Exam ?Vitals and nursing note reviewed.  ?Constitutional:   ?   General: He is not in acute distress. ?   Appearance: He is well-developed.  ?HENT:  ?   Head: Normocephalic and atraumatic.  ?Eyes:  ?   Conjunctiva/sclera: Conjunctivae normal.  ?Cardiovascular:  ?   Rate and Rhythm: Normal rate.  ?Pulmonary:  ?   Effort: Pulmonary effort is normal. No respiratory distress.  ?Musculoskeletal:     ?   General: No swelling.  ?   Comments: Tender right hand, swollen over knuckles,  nv and ns intact   ?Skin: ?   General: Skin is warm and dry.  ?   Capillary Refill: Capillary refill takes less than 2 seconds.  ?Neurological:  ?   Mental Status: He is alert.  ?Psychiatric:     ?   Mood and Affect: Mood normal.  ? ? ?ED Results / Procedures /  Treatments   ?Labs ?(all labs ordered are listed, but only abnormal results are displayed) ?Labs Reviewed - No data to display ? ?EKG ?None ? ?Radiology ?DG Hand Complete Right ? ?Result Date: 09/13/2021 ?CLINICAL DATA:  Injury. Pt states he "fist bumped" someone yesterday and has had pain and swelling in Rt hand since. Pt unable to fully extend fingers of Rt hand - additional view included to try and better visualize joint spaces of Rt fingers EXAM: RIGHT HAND - COMPLETE 3+ VIEW COMPARISON:  None. FINDINGS: Query fracture fragment and cortical irregularity of the lateral aspect of the base of the fourth metacarpal. There is no definite evidence of acute displaced fracture or dislocation. There is no evidence of arthropathy or other focal bone abnormality. Soft tissues are unremarkable. IMPRESSION: Query fracture fragment and cortical irregularity of the lateral aspect of the base of the fourth metacarpal. Electronically Signed   By:  Iven Finn M.D.   On: 09/13/2021 17:44   ? ?Procedures ?Procedures  ? ? ?Medications Ordered in ED ?Medications  ?predniSONE (DELTASONE) tablet 60 mg (has no administration in time range)  ? ? ?ED Course/ Medical Decision Making/ A&P ?  ?                        ?Medical Decision Making ?Amount and/or Complexity of Data Reviewed ?Radiology: ordered. ? ?Risk ?Prescription drug management. ? ? ?MDM:  Odette Horns shows old injury,  pain not over area of questioned fracture.  I suspect this is gout rather than injury.  Pt counseled on treatment and follow up  ? ? ? ? ? ? ? ?Final Clinical Impression(s) / ED Diagnoses ?Final diagnoses:  ?Injury of right hand, initial encounter  ? ? ?Rx / DC Orders ?ED Discharge Orders   ? ?      Ordered  ?  predniSONE (DELTASONE) 50 MG tablet       ? 09/13/21 2032  ?  HYDROcodone-acetaminophen (NORCO/VICODIN) 5-325 MG tablet  Every 6 hours PRN       ? 09/13/21 2032  ? ?  ?  ? ?  ? ?An After Visit Summary was printed and given to the patient.  ?  ?Fransico Meadow, PA-C ?09/15/21 1557 ? ?  ?Margette Fast, MD ?09/16/21 1254 ? ?

## 2021-09-20 DIAGNOSIS — S6991XA Unspecified injury of right wrist, hand and finger(s), initial encounter: Secondary | ICD-10-CM | POA: Diagnosis not present

## 2021-09-20 DIAGNOSIS — S66811A Strain of other specified muscles, fascia and tendons at wrist and hand level, right hand, initial encounter: Secondary | ICD-10-CM | POA: Diagnosis not present

## 2021-09-20 DIAGNOSIS — M79641 Pain in right hand: Secondary | ICD-10-CM | POA: Diagnosis not present

## 2021-09-23 DIAGNOSIS — M79641 Pain in right hand: Secondary | ICD-10-CM | POA: Diagnosis not present

## 2021-09-23 DIAGNOSIS — M19041 Primary osteoarthritis, right hand: Secondary | ICD-10-CM | POA: Diagnosis not present

## 2021-09-23 DIAGNOSIS — M1811 Unilateral primary osteoarthritis of first carpometacarpal joint, right hand: Secondary | ICD-10-CM | POA: Diagnosis not present

## 2021-10-07 DIAGNOSIS — Z79899 Other long term (current) drug therapy: Secondary | ICD-10-CM | POA: Diagnosis not present

## 2021-10-07 DIAGNOSIS — M0579 Rheumatoid arthritis with rheumatoid factor of multiple sites without organ or systems involvement: Secondary | ICD-10-CM | POA: Diagnosis not present

## 2021-10-31 DIAGNOSIS — G894 Chronic pain syndrome: Secondary | ICD-10-CM | POA: Diagnosis not present

## 2021-11-29 DIAGNOSIS — S336XXA Sprain of sacroiliac joint, initial encounter: Secondary | ICD-10-CM | POA: Diagnosis not present

## 2021-11-29 DIAGNOSIS — M5116 Intervertebral disc disorders with radiculopathy, lumbar region: Secondary | ICD-10-CM | POA: Diagnosis not present

## 2021-11-29 DIAGNOSIS — M48061 Spinal stenosis, lumbar region without neurogenic claudication: Secondary | ICD-10-CM | POA: Diagnosis not present

## 2021-11-29 DIAGNOSIS — M4722 Other spondylosis with radiculopathy, cervical region: Secondary | ICD-10-CM | POA: Diagnosis not present

## 2021-11-29 DIAGNOSIS — Z981 Arthrodesis status: Secondary | ICD-10-CM | POA: Diagnosis not present

## 2021-12-28 DIAGNOSIS — M7061 Trochanteric bursitis, right hip: Secondary | ICD-10-CM | POA: Diagnosis not present

## 2021-12-28 DIAGNOSIS — R221 Localized swelling, mass and lump, neck: Secondary | ICD-10-CM | POA: Diagnosis not present

## 2022-01-04 DIAGNOSIS — K118 Other diseases of salivary glands: Secondary | ICD-10-CM | POA: Diagnosis not present

## 2022-01-23 DIAGNOSIS — X32XXXD Exposure to sunlight, subsequent encounter: Secondary | ICD-10-CM | POA: Diagnosis not present

## 2022-01-23 DIAGNOSIS — L570 Actinic keratosis: Secondary | ICD-10-CM | POA: Diagnosis not present

## 2022-01-23 DIAGNOSIS — C44629 Squamous cell carcinoma of skin of left upper limb, including shoulder: Secondary | ICD-10-CM | POA: Diagnosis not present

## 2022-01-27 DIAGNOSIS — H353113 Nonexudative age-related macular degeneration, right eye, advanced atrophic without subfoveal involvement: Secondary | ICD-10-CM | POA: Diagnosis not present

## 2022-01-27 DIAGNOSIS — H353223 Exudative age-related macular degeneration, left eye, with inactive scar: Secondary | ICD-10-CM | POA: Diagnosis not present

## 2022-01-27 DIAGNOSIS — H26493 Other secondary cataract, bilateral: Secondary | ICD-10-CM | POA: Diagnosis not present

## 2022-02-01 DIAGNOSIS — H353113 Nonexudative age-related macular degeneration, right eye, advanced atrophic without subfoveal involvement: Secondary | ICD-10-CM | POA: Diagnosis not present

## 2022-03-13 DIAGNOSIS — M5136 Other intervertebral disc degeneration, lumbar region: Secondary | ICD-10-CM | POA: Diagnosis not present

## 2022-03-13 DIAGNOSIS — Z111 Encounter for screening for respiratory tuberculosis: Secondary | ICD-10-CM | POA: Diagnosis not present

## 2022-03-13 DIAGNOSIS — M1991 Primary osteoarthritis, unspecified site: Secondary | ICD-10-CM | POA: Diagnosis not present

## 2022-03-13 DIAGNOSIS — R768 Other specified abnormal immunological findings in serum: Secondary | ICD-10-CM | POA: Diagnosis not present

## 2022-03-13 DIAGNOSIS — M0579 Rheumatoid arthritis with rheumatoid factor of multiple sites without organ or systems involvement: Secondary | ICD-10-CM | POA: Diagnosis not present

## 2022-03-29 DIAGNOSIS — H43821 Vitreomacular adhesion, right eye: Secondary | ICD-10-CM | POA: Diagnosis not present

## 2022-03-29 DIAGNOSIS — H353113 Nonexudative age-related macular degeneration, right eye, advanced atrophic without subfoveal involvement: Secondary | ICD-10-CM | POA: Diagnosis not present

## 2022-03-29 DIAGNOSIS — H26493 Other secondary cataract, bilateral: Secondary | ICD-10-CM | POA: Diagnosis not present

## 2022-03-29 DIAGNOSIS — H353223 Exudative age-related macular degeneration, left eye, with inactive scar: Secondary | ICD-10-CM | POA: Diagnosis not present

## 2022-03-31 ENCOUNTER — Other Ambulatory Visit (HOSPITAL_COMMUNITY): Payer: Self-pay | Admitting: Adult Health Nurse Practitioner

## 2022-03-31 DIAGNOSIS — Z125 Encounter for screening for malignant neoplasm of prostate: Secondary | ICD-10-CM | POA: Diagnosis not present

## 2022-03-31 DIAGNOSIS — M0579 Rheumatoid arthritis with rheumatoid factor of multiple sites without organ or systems involvement: Secondary | ICD-10-CM | POA: Diagnosis not present

## 2022-03-31 DIAGNOSIS — I700 Atherosclerosis of aorta: Secondary | ICD-10-CM | POA: Diagnosis not present

## 2022-03-31 DIAGNOSIS — Z0000 Encounter for general adult medical examination without abnormal findings: Secondary | ICD-10-CM | POA: Diagnosis not present

## 2022-03-31 DIAGNOSIS — J438 Other emphysema: Secondary | ICD-10-CM | POA: Insufficient documentation

## 2022-03-31 DIAGNOSIS — F17219 Nicotine dependence, cigarettes, with unspecified nicotine-induced disorders: Secondary | ICD-10-CM

## 2022-03-31 DIAGNOSIS — D84821 Immunodeficiency due to drugs: Secondary | ICD-10-CM | POA: Diagnosis not present

## 2022-03-31 DIAGNOSIS — H353290 Exudative age-related macular degeneration, unspecified eye, stage unspecified: Secondary | ICD-10-CM | POA: Diagnosis not present

## 2022-04-04 ENCOUNTER — Encounter (INDEPENDENT_AMBULATORY_CARE_PROVIDER_SITE_OTHER): Payer: Self-pay | Admitting: *Deleted

## 2022-04-05 DIAGNOSIS — M7062 Trochanteric bursitis, left hip: Secondary | ICD-10-CM | POA: Diagnosis not present

## 2022-04-05 DIAGNOSIS — M545 Low back pain, unspecified: Secondary | ICD-10-CM | POA: Diagnosis not present

## 2022-04-05 DIAGNOSIS — G894 Chronic pain syndrome: Secondary | ICD-10-CM | POA: Diagnosis not present

## 2022-04-05 DIAGNOSIS — M79605 Pain in left leg: Secondary | ICD-10-CM | POA: Diagnosis not present

## 2022-04-05 DIAGNOSIS — M5416 Radiculopathy, lumbar region: Secondary | ICD-10-CM | POA: Diagnosis not present

## 2022-04-21 ENCOUNTER — Ambulatory Visit (HOSPITAL_COMMUNITY)
Admission: RE | Admit: 2022-04-21 | Discharge: 2022-04-21 | Disposition: A | Discharge status: Home / Self Care | Payer: BC Managed Care – PPO | Source: Ambulatory Visit | Attending: Adult Health Nurse Practitioner | Admitting: Adult Health Nurse Practitioner

## 2022-04-21 DIAGNOSIS — F17219 Nicotine dependence, cigarettes, with unspecified nicotine-induced disorders: Secondary | ICD-10-CM | POA: Diagnosis not present

## 2022-04-21 DIAGNOSIS — F17210 Nicotine dependence, cigarettes, uncomplicated: Secondary | ICD-10-CM | POA: Diagnosis not present

## 2022-05-10 DIAGNOSIS — H353122 Nonexudative age-related macular degeneration, left eye, intermediate dry stage: Secondary | ICD-10-CM | POA: Diagnosis not present

## 2022-05-15 DIAGNOSIS — M5416 Radiculopathy, lumbar region: Secondary | ICD-10-CM | POA: Diagnosis not present

## 2022-05-15 DIAGNOSIS — M79605 Pain in left leg: Secondary | ICD-10-CM | POA: Diagnosis not present

## 2022-05-15 DIAGNOSIS — M545 Low back pain, unspecified: Secondary | ICD-10-CM | POA: Diagnosis not present

## 2022-05-15 DIAGNOSIS — G894 Chronic pain syndrome: Secondary | ICD-10-CM | POA: Diagnosis not present

## 2022-05-17 DIAGNOSIS — M5416 Radiculopathy, lumbar region: Secondary | ICD-10-CM | POA: Diagnosis not present

## 2022-06-13 DIAGNOSIS — H353113 Nonexudative age-related macular degeneration, right eye, advanced atrophic without subfoveal involvement: Secondary | ICD-10-CM | POA: Diagnosis not present

## 2022-06-13 DIAGNOSIS — M4316 Spondylolisthesis, lumbar region: Secondary | ICD-10-CM | POA: Diagnosis not present

## 2022-06-13 DIAGNOSIS — H43821 Vitreomacular adhesion, right eye: Secondary | ICD-10-CM | POA: Diagnosis not present

## 2022-06-13 DIAGNOSIS — H26493 Other secondary cataract, bilateral: Secondary | ICD-10-CM | POA: Diagnosis not present

## 2022-06-13 DIAGNOSIS — H353223 Exudative age-related macular degeneration, left eye, with inactive scar: Secondary | ICD-10-CM | POA: Diagnosis not present

## 2022-06-19 DIAGNOSIS — G894 Chronic pain syndrome: Secondary | ICD-10-CM | POA: Diagnosis not present

## 2022-06-26 DIAGNOSIS — J439 Emphysema, unspecified: Secondary | ICD-10-CM | POA: Diagnosis not present

## 2022-06-28 DIAGNOSIS — H353124 Nonexudative age-related macular degeneration, left eye, advanced atrophic with subfoveal involvement: Secondary | ICD-10-CM | POA: Diagnosis not present

## 2022-06-28 DIAGNOSIS — Z961 Presence of intraocular lens: Secondary | ICD-10-CM | POA: Diagnosis not present

## 2022-06-28 DIAGNOSIS — H26493 Other secondary cataract, bilateral: Secondary | ICD-10-CM | POA: Diagnosis not present

## 2022-06-28 DIAGNOSIS — H353113 Nonexudative age-related macular degeneration, right eye, advanced atrophic without subfoveal involvement: Secondary | ICD-10-CM | POA: Diagnosis not present

## 2022-07-10 DIAGNOSIS — Z7951 Long term (current) use of inhaled steroids: Secondary | ICD-10-CM | POA: Diagnosis not present

## 2022-07-10 DIAGNOSIS — Z7901 Long term (current) use of anticoagulants: Secondary | ICD-10-CM | POA: Diagnosis not present

## 2022-07-10 DIAGNOSIS — Z79620 Long term (current) use of immunosuppressive biologic: Secondary | ICD-10-CM | POA: Diagnosis not present

## 2022-07-10 DIAGNOSIS — Z86711 Personal history of pulmonary embolism: Secondary | ICD-10-CM | POA: Diagnosis not present

## 2022-07-10 DIAGNOSIS — I10 Essential (primary) hypertension: Secondary | ICD-10-CM | POA: Diagnosis not present

## 2022-07-10 DIAGNOSIS — M0589 Other rheumatoid arthritis with rheumatoid factor of multiple sites: Secondary | ICD-10-CM | POA: Diagnosis not present

## 2022-07-10 DIAGNOSIS — M5417 Radiculopathy, lumbosacral region: Secondary | ICD-10-CM | POA: Diagnosis not present

## 2022-07-10 DIAGNOSIS — M4726 Other spondylosis with radiculopathy, lumbar region: Secondary | ICD-10-CM | POA: Diagnosis not present

## 2022-07-10 DIAGNOSIS — I2510 Atherosclerotic heart disease of native coronary artery without angina pectoris: Secondary | ICD-10-CM | POA: Diagnosis not present

## 2022-07-10 DIAGNOSIS — M4317 Spondylolisthesis, lumbosacral region: Secondary | ICD-10-CM | POA: Diagnosis not present

## 2022-07-10 DIAGNOSIS — Z79899 Other long term (current) drug therapy: Secondary | ICD-10-CM | POA: Diagnosis not present

## 2022-07-10 DIAGNOSIS — M4316 Spondylolisthesis, lumbar region: Secondary | ICD-10-CM | POA: Diagnosis not present

## 2022-07-10 DIAGNOSIS — Z981 Arthrodesis status: Secondary | ICD-10-CM | POA: Diagnosis not present

## 2022-07-10 DIAGNOSIS — J439 Emphysema, unspecified: Secondary | ICD-10-CM | POA: Diagnosis not present

## 2022-07-10 DIAGNOSIS — F17210 Nicotine dependence, cigarettes, uncomplicated: Secondary | ICD-10-CM | POA: Diagnosis not present

## 2022-07-10 DIAGNOSIS — M5416 Radiculopathy, lumbar region: Secondary | ICD-10-CM | POA: Diagnosis not present

## 2022-07-10 DIAGNOSIS — M48061 Spinal stenosis, lumbar region without neurogenic claudication: Secondary | ICD-10-CM | POA: Diagnosis not present

## 2022-07-11 DIAGNOSIS — Z7901 Long term (current) use of anticoagulants: Secondary | ICD-10-CM | POA: Diagnosis not present

## 2022-07-11 DIAGNOSIS — I2510 Atherosclerotic heart disease of native coronary artery without angina pectoris: Secondary | ICD-10-CM | POA: Diagnosis not present

## 2022-07-11 DIAGNOSIS — F17210 Nicotine dependence, cigarettes, uncomplicated: Secondary | ICD-10-CM | POA: Diagnosis not present

## 2022-07-11 DIAGNOSIS — Z7951 Long term (current) use of inhaled steroids: Secondary | ICD-10-CM | POA: Diagnosis not present

## 2022-07-11 DIAGNOSIS — M4726 Other spondylosis with radiculopathy, lumbar region: Secondary | ICD-10-CM | POA: Diagnosis not present

## 2022-07-11 DIAGNOSIS — J439 Emphysema, unspecified: Secondary | ICD-10-CM | POA: Diagnosis not present

## 2022-07-11 DIAGNOSIS — Z981 Arthrodesis status: Secondary | ICD-10-CM | POA: Diagnosis not present

## 2022-07-11 DIAGNOSIS — Z79620 Long term (current) use of immunosuppressive biologic: Secondary | ICD-10-CM | POA: Diagnosis not present

## 2022-07-11 DIAGNOSIS — Z79899 Other long term (current) drug therapy: Secondary | ICD-10-CM | POA: Diagnosis not present

## 2022-07-11 DIAGNOSIS — Z86711 Personal history of pulmonary embolism: Secondary | ICD-10-CM | POA: Diagnosis not present

## 2022-07-11 DIAGNOSIS — M0589 Other rheumatoid arthritis with rheumatoid factor of multiple sites: Secondary | ICD-10-CM | POA: Diagnosis not present

## 2022-07-11 DIAGNOSIS — M4316 Spondylolisthesis, lumbar region: Secondary | ICD-10-CM | POA: Diagnosis not present

## 2022-07-11 DIAGNOSIS — I10 Essential (primary) hypertension: Secondary | ICD-10-CM | POA: Diagnosis not present

## 2022-07-21 DIAGNOSIS — Z981 Arthrodesis status: Secondary | ICD-10-CM | POA: Diagnosis not present

## 2022-08-01 ENCOUNTER — Other Ambulatory Visit (HOSPITAL_COMMUNITY): Payer: Self-pay | Admitting: Adult Health Nurse Practitioner

## 2022-08-01 DIAGNOSIS — M5116 Intervertebral disc disorders with radiculopathy, lumbar region: Secondary | ICD-10-CM | POA: Diagnosis not present

## 2022-08-01 DIAGNOSIS — M4316 Spondylolisthesis, lumbar region: Secondary | ICD-10-CM | POA: Diagnosis not present

## 2022-08-01 DIAGNOSIS — R0989 Other specified symptoms and signs involving the circulatory and respiratory systems: Secondary | ICD-10-CM

## 2022-08-01 DIAGNOSIS — I7090 Unspecified atherosclerosis: Secondary | ICD-10-CM | POA: Insufficient documentation

## 2022-08-01 DIAGNOSIS — Z1330 Encounter for screening examination for mental health and behavioral disorders, unspecified: Secondary | ICD-10-CM | POA: Diagnosis not present

## 2022-08-01 DIAGNOSIS — M1991 Primary osteoarthritis, unspecified site: Secondary | ICD-10-CM | POA: Insufficient documentation

## 2022-08-01 DIAGNOSIS — R2689 Other abnormalities of gait and mobility: Secondary | ICD-10-CM | POA: Diagnosis not present

## 2022-08-01 DIAGNOSIS — M5416 Radiculopathy, lumbar region: Secondary | ICD-10-CM | POA: Diagnosis not present

## 2022-08-01 DIAGNOSIS — M4726 Other spondylosis with radiculopathy, lumbar region: Secondary | ICD-10-CM | POA: Diagnosis not present

## 2022-08-01 DIAGNOSIS — M79605 Pain in left leg: Secondary | ICD-10-CM

## 2022-08-01 DIAGNOSIS — Z981 Arthrodesis status: Secondary | ICD-10-CM | POA: Diagnosis not present

## 2022-08-01 DIAGNOSIS — M5117 Intervertebral disc disorders with radiculopathy, lumbosacral region: Secondary | ICD-10-CM | POA: Diagnosis not present

## 2022-08-01 DIAGNOSIS — R768 Other specified abnormal immunological findings in serum: Secondary | ICD-10-CM | POA: Insufficient documentation

## 2022-08-02 DIAGNOSIS — I739 Peripheral vascular disease, unspecified: Secondary | ICD-10-CM | POA: Diagnosis not present

## 2022-08-02 DIAGNOSIS — M79605 Pain in left leg: Secondary | ICD-10-CM | POA: Diagnosis not present

## 2022-08-04 DIAGNOSIS — K5730 Diverticulosis of large intestine without perforation or abscess without bleeding: Secondary | ICD-10-CM | POA: Diagnosis not present

## 2022-08-04 DIAGNOSIS — I708 Atherosclerosis of other arteries: Secondary | ICD-10-CM | POA: Diagnosis not present

## 2022-08-04 DIAGNOSIS — M79605 Pain in left leg: Secondary | ICD-10-CM | POA: Diagnosis not present

## 2022-08-04 DIAGNOSIS — I70203 Unspecified atherosclerosis of native arteries of extremities, bilateral legs: Secondary | ICD-10-CM | POA: Diagnosis not present

## 2022-08-04 DIAGNOSIS — I739 Peripheral vascular disease, unspecified: Secondary | ICD-10-CM | POA: Diagnosis not present

## 2022-08-04 DIAGNOSIS — K449 Diaphragmatic hernia without obstruction or gangrene: Secondary | ICD-10-CM | POA: Diagnosis not present

## 2022-08-06 DIAGNOSIS — I70202 Unspecified atherosclerosis of native arteries of extremities, left leg: Secondary | ICD-10-CM | POA: Diagnosis not present

## 2022-08-06 DIAGNOSIS — J449 Chronic obstructive pulmonary disease, unspecified: Secondary | ICD-10-CM | POA: Diagnosis not present

## 2022-08-06 DIAGNOSIS — F17210 Nicotine dependence, cigarettes, uncomplicated: Secondary | ICD-10-CM | POA: Diagnosis not present

## 2022-08-06 DIAGNOSIS — M0689 Other specified rheumatoid arthritis, multiple sites: Secondary | ICD-10-CM | POA: Diagnosis not present

## 2022-08-06 DIAGNOSIS — I10 Essential (primary) hypertension: Secondary | ICD-10-CM | POA: Diagnosis not present

## 2022-08-06 DIAGNOSIS — M069 Rheumatoid arthritis, unspecified: Secondary | ICD-10-CM | POA: Diagnosis not present

## 2022-08-06 DIAGNOSIS — R0789 Other chest pain: Secondary | ICD-10-CM | POA: Diagnosis not present

## 2022-08-06 DIAGNOSIS — M79605 Pain in left leg: Secondary | ICD-10-CM | POA: Diagnosis not present

## 2022-08-06 DIAGNOSIS — M25572 Pain in left ankle and joints of left foot: Secondary | ICD-10-CM | POA: Diagnosis not present

## 2022-08-06 DIAGNOSIS — I70298 Other atherosclerosis of native arteries of extremities, other extremity: Secondary | ICD-10-CM | POA: Diagnosis not present

## 2022-08-06 DIAGNOSIS — Z79899 Other long term (current) drug therapy: Secondary | ICD-10-CM | POA: Diagnosis not present

## 2022-08-08 DIAGNOSIS — Z79891 Long term (current) use of opiate analgesic: Secondary | ICD-10-CM | POA: Diagnosis not present

## 2022-08-08 DIAGNOSIS — I771 Stricture of artery: Secondary | ICD-10-CM | POA: Diagnosis not present

## 2022-08-08 DIAGNOSIS — Z981 Arthrodesis status: Secondary | ICD-10-CM | POA: Diagnosis not present

## 2022-08-08 DIAGNOSIS — J449 Chronic obstructive pulmonary disease, unspecified: Secondary | ICD-10-CM | POA: Diagnosis not present

## 2022-08-08 DIAGNOSIS — M069 Rheumatoid arthritis, unspecified: Secondary | ICD-10-CM | POA: Diagnosis not present

## 2022-08-08 DIAGNOSIS — I70222 Atherosclerosis of native arteries of extremities with rest pain, left leg: Secondary | ICD-10-CM | POA: Diagnosis not present

## 2022-08-08 DIAGNOSIS — I739 Peripheral vascular disease, unspecified: Secondary | ICD-10-CM | POA: Insufficient documentation

## 2022-08-08 DIAGNOSIS — F17210 Nicotine dependence, cigarettes, uncomplicated: Secondary | ICD-10-CM | POA: Diagnosis not present

## 2022-08-08 DIAGNOSIS — Z86711 Personal history of pulmonary embolism: Secondary | ICD-10-CM | POA: Diagnosis not present

## 2022-08-08 DIAGNOSIS — Z7901 Long term (current) use of anticoagulants: Secondary | ICD-10-CM | POA: Diagnosis not present

## 2022-08-08 DIAGNOSIS — Z7951 Long term (current) use of inhaled steroids: Secondary | ICD-10-CM | POA: Diagnosis not present

## 2022-08-08 DIAGNOSIS — I10 Essential (primary) hypertension: Secondary | ICD-10-CM | POA: Diagnosis not present

## 2022-08-08 DIAGNOSIS — Z79899 Other long term (current) drug therapy: Secondary | ICD-10-CM | POA: Diagnosis not present

## 2022-08-09 ENCOUNTER — Encounter (HOSPITAL_COMMUNITY): Payer: Self-pay

## 2022-08-09 ENCOUNTER — Ambulatory Visit (HOSPITAL_COMMUNITY): Payer: BC Managed Care – PPO

## 2022-08-15 DIAGNOSIS — R571 Hypovolemic shock: Secondary | ICD-10-CM | POA: Diagnosis not present

## 2022-08-15 DIAGNOSIS — I959 Hypotension, unspecified: Secondary | ICD-10-CM | POA: Diagnosis not present

## 2022-08-15 DIAGNOSIS — W19XXXA Unspecified fall, initial encounter: Secondary | ICD-10-CM | POA: Diagnosis not present

## 2022-08-16 ENCOUNTER — Emergency Department (HOSPITAL_COMMUNITY)
Admission: EM | Admit: 2022-08-16 | Discharge: 2022-08-16 | Disposition: A | Discharge status: Home / Self Care | Payer: BC Managed Care – PPO | Attending: Emergency Medicine | Admitting: Emergency Medicine

## 2022-08-16 ENCOUNTER — Other Ambulatory Visit: Payer: Self-pay

## 2022-08-16 DIAGNOSIS — R600 Localized edema: Secondary | ICD-10-CM | POA: Diagnosis not present

## 2022-08-16 DIAGNOSIS — R5381 Other malaise: Secondary | ICD-10-CM | POA: Diagnosis not present

## 2022-08-16 DIAGNOSIS — Z95828 Presence of other vascular implants and grafts: Secondary | ICD-10-CM

## 2022-08-16 DIAGNOSIS — M79673 Pain in unspecified foot: Secondary | ICD-10-CM | POA: Diagnosis not present

## 2022-08-16 DIAGNOSIS — Z7901 Long term (current) use of anticoagulants: Secondary | ICD-10-CM | POA: Diagnosis not present

## 2022-08-16 DIAGNOSIS — M79672 Pain in left foot: Secondary | ICD-10-CM | POA: Insufficient documentation

## 2022-08-16 DIAGNOSIS — Z981 Arthrodesis status: Secondary | ICD-10-CM | POA: Diagnosis not present

## 2022-08-16 DIAGNOSIS — M5117 Intervertebral disc disorders with radiculopathy, lumbosacral region: Secondary | ICD-10-CM | POA: Diagnosis not present

## 2022-08-16 MED ORDER — HYDROMORPHONE HCL 1 MG/ML IJ SOLN
2.0000 mg | Freq: Once | INTRAMUSCULAR | Status: AC
  Administered 2022-08-16: 2 mg via INTRAMUSCULAR
  Filled 2022-08-16: qty 2

## 2022-08-16 NOTE — ED Notes (Signed)
ED Provider at bedside. 

## 2022-08-16 NOTE — ED Triage Notes (Signed)
Pt states he had peripheral stents placed last week and is now having swelling and pain to LLE.

## 2022-08-16 NOTE — ED Provider Notes (Signed)
[facility] Provider Note   CSN: [id number] Arrival date & time: 08/16/22  0056     History  Chief Complaint  Patient presents with   Leg Pain    Micheal Serrano is a 67 y.o. male.  Patient is a 67 year old male with history of peripheral artery disease status post iliac stenting performed at [facility] several days ago.  Patient presenting today with complaints of left foot pain.  This left foot pain has been ongoing for several months after having back surgery the end of January.  Patient and wife also complain of the foot swelling and are very concerned about this.  He has an appointment tomorrow with his vascular surgeon and is to have a CT scan performed at that time.  No fevers or chills.  No chest pain or difficulty breathing.  He does have history of prior pulmonary embolism and is being treated with Eliquis.  He reports being compliant with this.  The history is provided by the patient.       Home Medications Prior to Admission medications   Medication Sig Start Date End Date Taking? Authorizing Provider  acetaminophen (TYLENOL) 325 MG tablet Take 650 mg by mouth every 6 (six) hours as needed for mild pain.    [provider]  apixaban (ELIQUIS) 5 MG TABS tablet Take 1 tablet (5 mg total) by mouth 2 (two) times daily. 09/25/20 12/24/20  Manuella Ghazi, Pratik D, DO  Cyanocobalamin (VITAMIN B-12 PO) Take by mouth.    [provider]  folic acid (FOLVITE) 1 MG tablet Take 1 mg by mouth daily. 05/16/21   [provider]  HUMIRA PEN 40 MG/0.4ML PNKT Inject 40 mg into the skin every 14 (fourteen) days. 08/16/20   [provider]  HYDROcodone-acetaminophen (NORCO/VICODIN) 5-325 MG tablet Take 2 tablets by mouth every 6 (six) hours as needed for moderate pain. 09/13/21 09/13/22  Fransico Meadow, PA-C  olmesartan (BENICAR) 40 MG tablet Take 40 mg by mouth daily.    [provider]  predniSONE (DELTASONE)  50 MG tablet One tablet a day 09/13/21   Fransico Meadow, PA-C  thiamine 100 MG tablet Take 100 mg by mouth daily. 12/28/20   [provider]      Allergies    Patient has no known allergies.    Review of Systems   Review of Systems  All other systems reviewed and are negative.   Physical Exam Updated Vital Signs BP (!) 141/81   Pulse 88   Temp 98.2 F (36.8 C) (Oral)   Resp 18   Ht '5\' 6"'$  (1.676 m)   Wt 70 kg   SpO2 94%   BMI 24.91 kg/m  Physical Exam Vitals and nursing note reviewed.  Constitutional:      General: He is not in acute distress.    Appearance: He is well-developed. He is not diaphoretic.  HENT:     Head: Normocephalic and atraumatic.  Cardiovascular:     Heart sounds: No murmur heard.    No friction rub.  Pulmonary:     Effort: Pulmonary effort is normal.  Musculoskeletal:        General: Normal range of motion.     Cervical back: Normal range of motion and neck supple.     Comments: The left lower extremity has trace to 1+ edema.  DP pulses and PT pulses are bounding and easily palpable.  The foot is warm to the touch  and appears well-perfused.  The right foot is without edema and pulses are intact.  Skin:    General: Skin is warm and dry.  Neurological:     Mental Status: He is alert and oriented to person, place, and time.     Coordination: Coordination normal.     ED Results / Procedures / Treatments   Labs (all labs ordered are listed, but only abnormal results are displayed) Labs Reviewed - No data to display  EKG None  Radiology No results found.  Procedures Procedures    Medications Ordered in ED Medications  HYDROmorphone (DILAUDID) injection 2 mg (has no administration in time range)    ED Course/ Medical Decision Making/ A&P  Patient is a 66 year old male presenting with complaints of severe left foot pain in the setting of recent iliac stent placement.  Patient arrives here with stable vital signs and is  afebrile.  On exam he has mild swelling to the left foot with easily palpable dorsalis pedis and posterior tibial pulses, but no other abnormal findings.  There is no calf tenderness.  Patient was given IM Dilaudid with good results.  This did make him somewhat hypoxic for a short period of time, but this was transient and is in no distress.  As he has recently undergone specialized vascular surgery and neurosurgery at an outside facility, I feel as though patient would be best cared for by following up with his providers later familiar with his care.  He does have an appointment tomorrow for a CT scan then evaluation by his vascular surgeon.  Nothing in tonight's evaluation appears emergent.  His foot is well-perfused and warm to the touch.  I see no signs of vascular compromise.  I also have considered, but doubt DVT.  Even if it is a blood clot, patient is already being treated with Eliquis.  Patient and wife asked me multiple questions that were somewhat out of the focus of my area of expertise and have deferred answering to his surgeons.  Final Clinical Impression(s) / ED Diagnoses Final diagnoses:  None    Rx / DC Orders ED Discharge Orders     None         Veryl Speak, MD 08/16/22 0401

## 2022-08-16 NOTE — Discharge Instructions (Signed)
Continue medications as previously prescribed.  Follow-up with your surgeons today as scheduled.

## 2022-08-17 DIAGNOSIS — R413 Other amnesia: Secondary | ICD-10-CM | POA: Diagnosis not present

## 2022-08-17 DIAGNOSIS — M5417 Radiculopathy, lumbosacral region: Secondary | ICD-10-CM | POA: Diagnosis not present

## 2022-08-17 DIAGNOSIS — E538 Deficiency of other specified B group vitamins: Secondary | ICD-10-CM | POA: Diagnosis not present

## 2022-08-17 DIAGNOSIS — R634 Abnormal weight loss: Secondary | ICD-10-CM | POA: Diagnosis not present

## 2022-08-17 DIAGNOSIS — R7301 Impaired fasting glucose: Secondary | ICD-10-CM | POA: Diagnosis not present

## 2022-08-17 DIAGNOSIS — E559 Vitamin D deficiency, unspecified: Secondary | ICD-10-CM | POA: Diagnosis not present

## 2022-08-17 IMAGING — MR MR LUMBAR SPINE W/O CM
4 of 5 series · 32 of 48 positions shown · non-contrast
Comparison: 06/01/2014

CLINICAL DATA: Low back pain radiating to both legs for 4 months

EXAM:
MRI LUMBAR SPINE WITHOUT CONTRAST
TECHNIQUE: Multiplanar, multisequence MR imaging of the lumbar spine was
performed. No intravenous contrast was administered.

[Series 5: T2 · sagittal · 4.0mm · 0.68mm/px · 8 of 13 slices shown (1 of 2)]
[im 1/13]
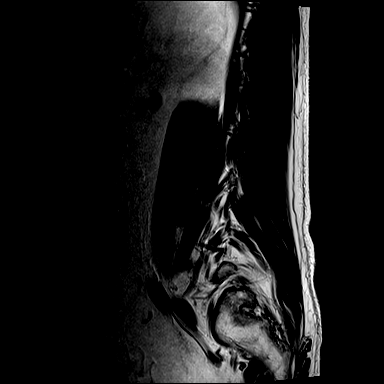
[im 2/13]
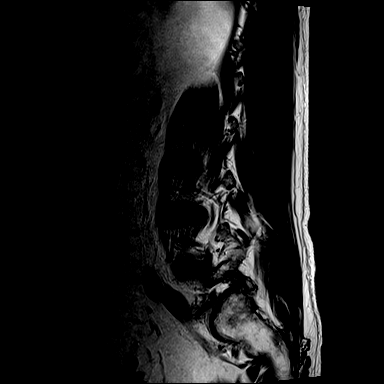
[im 4/13]
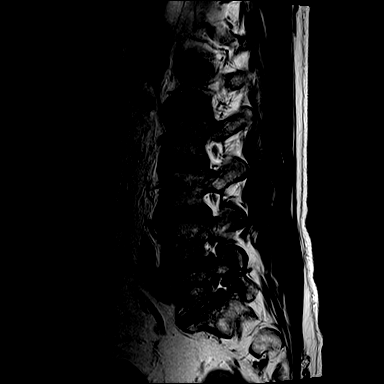
[im 6/13]
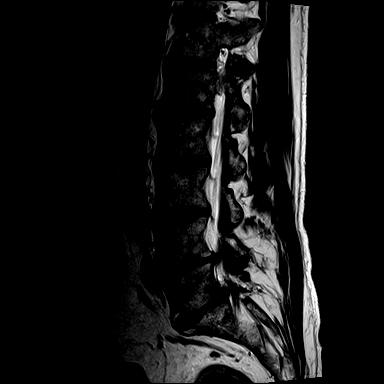
[im 7/13]
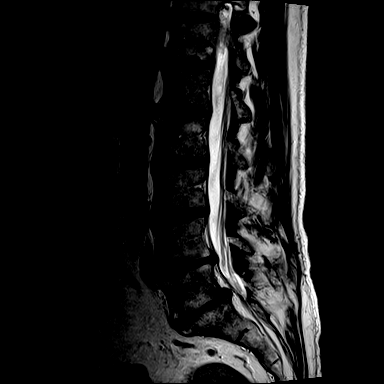
[im 9/13]
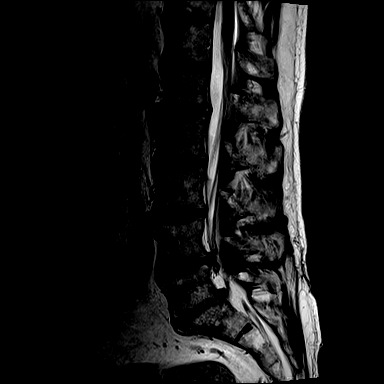
[im 11/13]
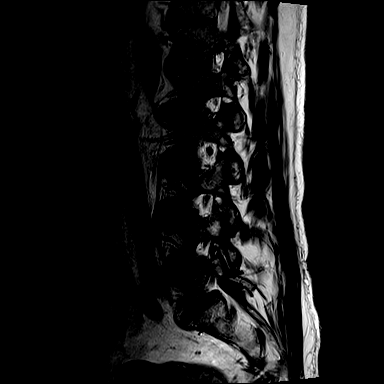
[im 13/13]
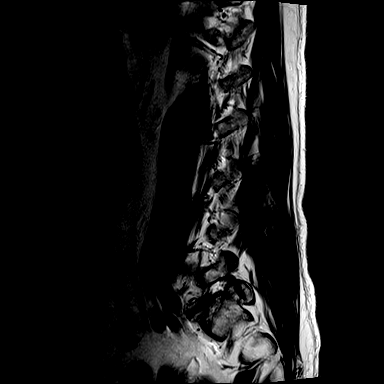

[Series 6: T1 · sagittal · 4.0mm · 0.81mm/px · 7 of 13 slices shown (1 of 2)]
[im 1/13]
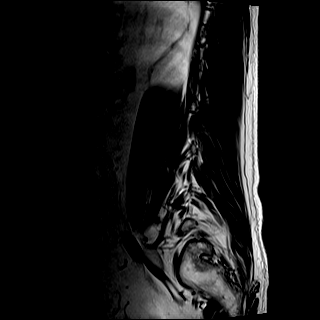
[im 3/13]
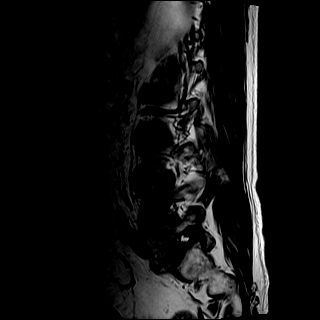
[im 5/13]
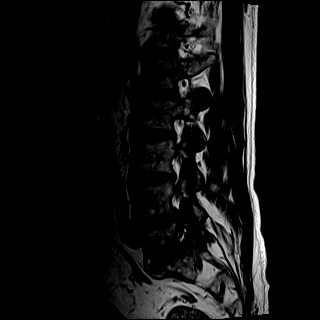
[im 7/13]
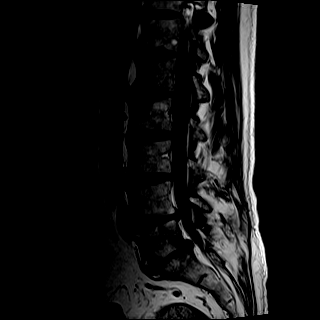
[im 9/13]
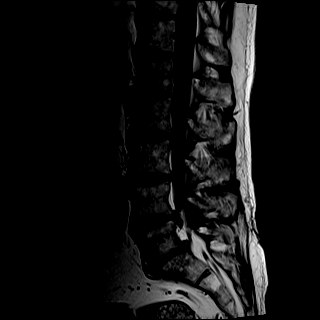
[im 11/13]
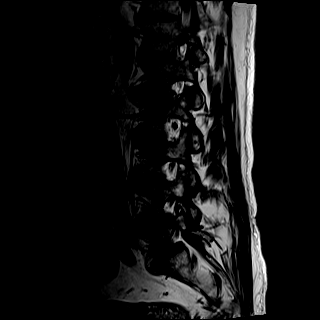
[im 13/13]
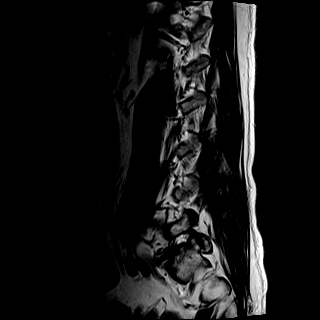

[Series 8: T2 · axial · 4.0mm · 0.70mm/px · z∈[-116,+42]mm · 9 of 25 slices shown (2 of 2)]
[im 1/25]
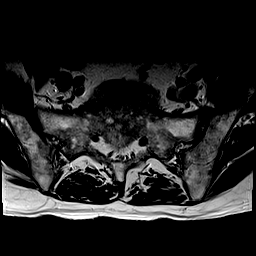
[im 5/25]
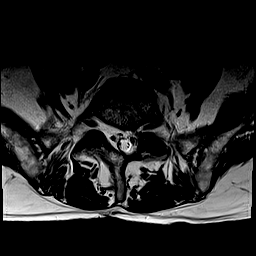
[im 9/25]
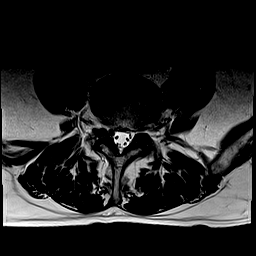
[im 11/25]
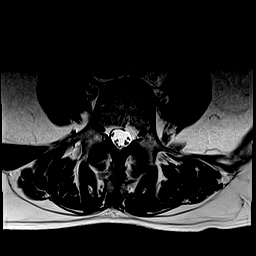
[im 13/25]
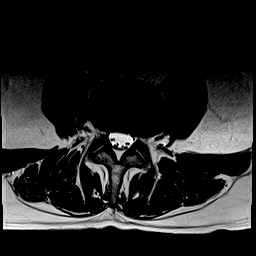
[im 15/25]
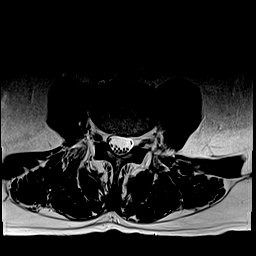
[im 17/25]
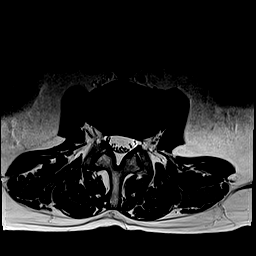
[im 21/25]
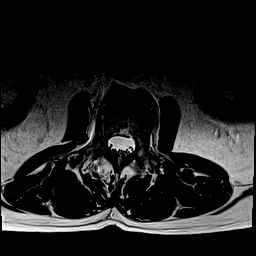
[im 25/25]
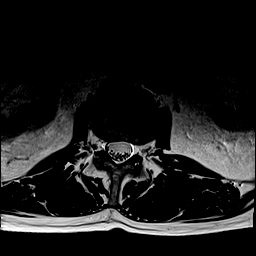

[Series 9: T1 · axial · 4.0mm · 0.35mm/px · z∈[-116,+22]mm · 8 of 25 slices shown (2 of 2)]
[im 1/25]
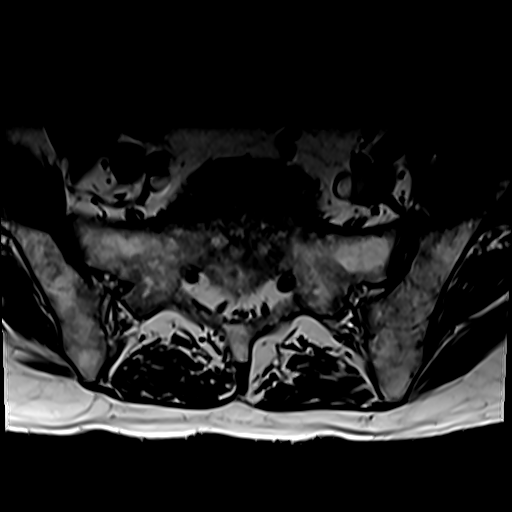
[im 5/25]
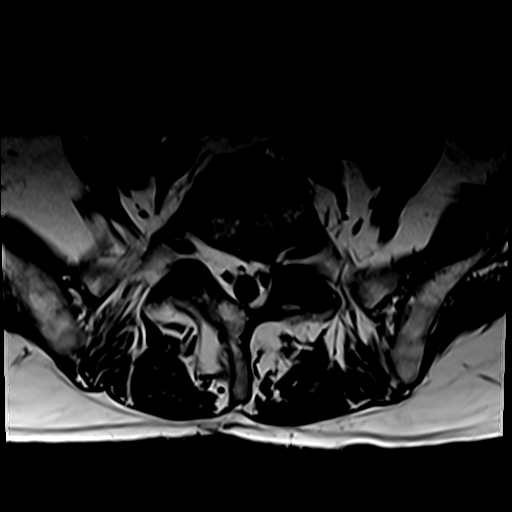
[im 9/25]
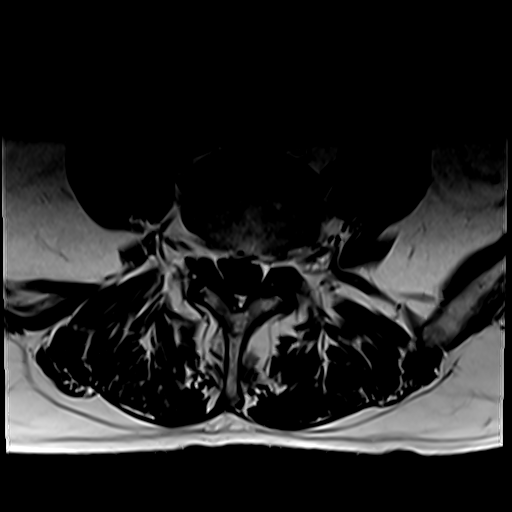
[im 11/25]
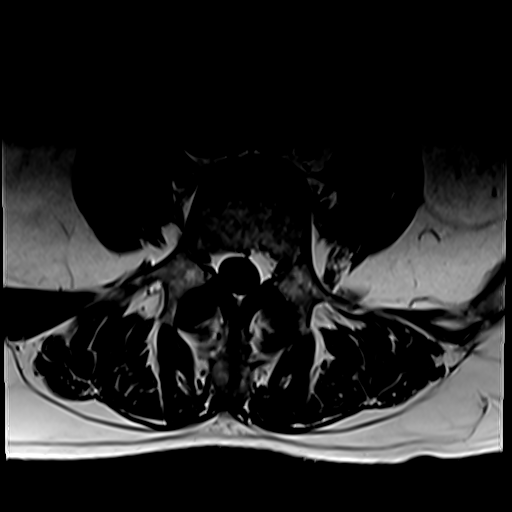
[im 13/25]
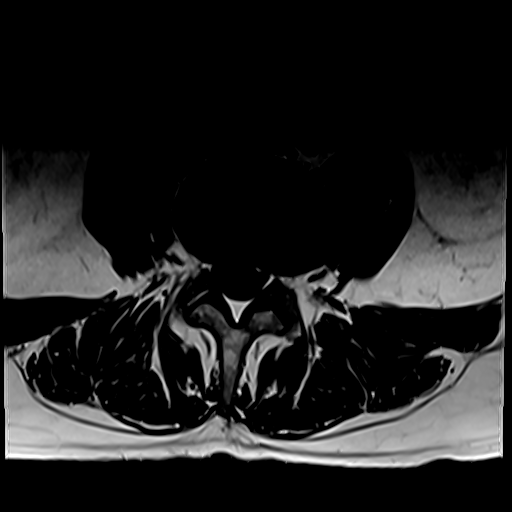
[im 15/25]
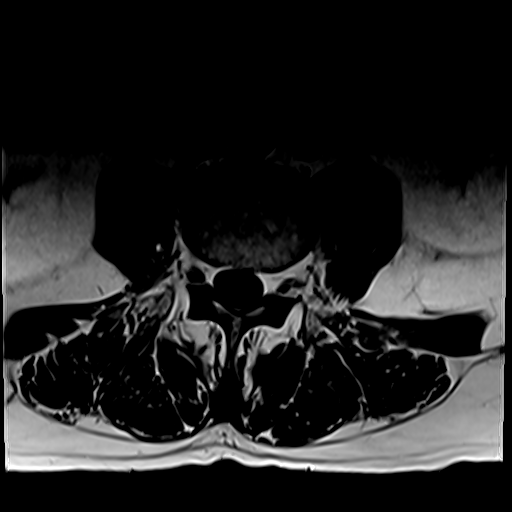
[im 17/25]
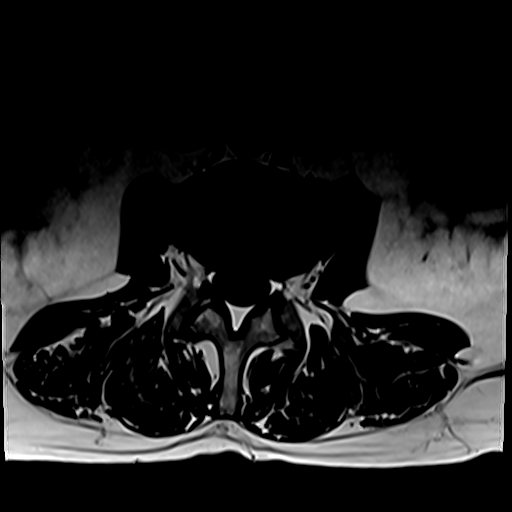
[im 21/25]
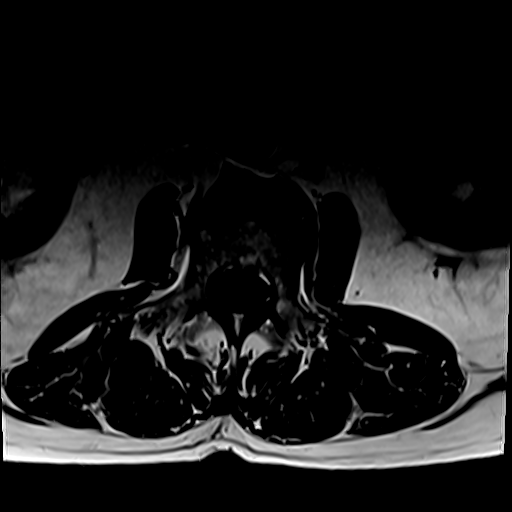

[32 of 48 positions shown; findings below may reference images not displayed]

FINDINGS: Segmentation:  Standard.

Alignment:  Physiologic.

Vertebrae:  No fracture, evidence of discitis, or bone lesion.

Conus medullaris and cauda equina: Conus extends to the T12 level.
Conus and cauda equina appear normal.

Paraspinal and other soft tissues: No acute paraspinal abnormality.

Disc levels:

Disc spaces: Degenerative disease with severe disc height loss at
L5-S1 which has progressed compared with 06/01/2014. Degenerative
disease with disc height loss at L1-2 and L4-5.

T12-L1: No significant disc bulge. No evidence of neural foraminal
stenosis. No central canal stenosis.

L1-L2: Mild broad-based disc bulge. Mild bilateral facet
arthropathy. No evidence of neural foraminal stenosis. No central
canal stenosis.

L2-L3: No significant disc bulge. No evidence of neural foraminal
stenosis. No central canal stenosis.

L3-L4: Left lateral broad disc protrusion. Mild bilateral facet
arthropathy. No evidence of neural foraminal stenosis. No central
canal stenosis.

L4-L5: Broad-based disc bulge with a right paracentral disc
protrusion. Bilateral lateral recess stenosis. No evidence of neural
foraminal stenosis. No central canal stenosis. Mild bilateral facet
arthropathy.

L5-S1: Prior left laminectomy. Broad-based disc osteophyte complex.
Moderate bilateral facet arthropathy. Mild bilateral foraminal
stenosis. No central canal stenosis.
IMPRESSION: 1. At L3-4 there is a left lateral broad disc protrusion. Mild
bilateral facet arthropathy.
2. At L4-5 there is a broad-based disc bulge with a right
paracentral disc protrusion. Bilateral lateral recess stenosis. Mild
bilateral facet arthropathy.
3. At L5-S1 there is evidence of a prior left laminectomy.
Broad-based disc osteophyte complex. Moderate bilateral facet
arthropathy. Mild bilateral foraminal stenosis.

## 2022-08-18 DIAGNOSIS — M47816 Spondylosis without myelopathy or radiculopathy, lumbar region: Secondary | ICD-10-CM | POA: Diagnosis not present

## 2022-08-18 DIAGNOSIS — M5416 Radiculopathy, lumbar region: Secondary | ICD-10-CM | POA: Diagnosis not present

## 2022-08-18 DIAGNOSIS — M4726 Other spondylosis with radiculopathy, lumbar region: Secondary | ICD-10-CM | POA: Diagnosis not present

## 2022-08-18 DIAGNOSIS — I959 Hypotension, unspecified: Secondary | ICD-10-CM | POA: Diagnosis not present

## 2022-08-18 DIAGNOSIS — M5126 Other intervertebral disc displacement, lumbar region: Secondary | ICD-10-CM | POA: Diagnosis not present

## 2022-08-18 DIAGNOSIS — Z981 Arthrodesis status: Secondary | ICD-10-CM | POA: Diagnosis not present

## 2022-08-18 DIAGNOSIS — Z4789 Encounter for other orthopedic aftercare: Secondary | ICD-10-CM | POA: Diagnosis not present

## 2022-08-18 DIAGNOSIS — M79672 Pain in left foot: Secondary | ICD-10-CM | POA: Diagnosis not present

## 2022-08-23 DIAGNOSIS — M5416 Radiculopathy, lumbar region: Secondary | ICD-10-CM | POA: Diagnosis not present

## 2022-08-28 DIAGNOSIS — F17210 Nicotine dependence, cigarettes, uncomplicated: Secondary | ICD-10-CM | POA: Diagnosis not present

## 2022-08-28 DIAGNOSIS — M48061 Spinal stenosis, lumbar region without neurogenic claudication: Secondary | ICD-10-CM | POA: Diagnosis not present

## 2022-08-28 DIAGNOSIS — Z86711 Personal history of pulmonary embolism: Secondary | ICD-10-CM | POA: Diagnosis not present

## 2022-08-28 DIAGNOSIS — I739 Peripheral vascular disease, unspecified: Secondary | ICD-10-CM | POA: Diagnosis not present

## 2022-08-28 DIAGNOSIS — J438 Other emphysema: Secondary | ICD-10-CM | POA: Diagnosis not present

## 2022-08-28 DIAGNOSIS — Z981 Arthrodesis status: Secondary | ICD-10-CM | POA: Diagnosis not present

## 2022-08-28 DIAGNOSIS — Z7952 Long term (current) use of systemic steroids: Secondary | ICD-10-CM | POA: Diagnosis not present

## 2022-08-28 DIAGNOSIS — M5416 Radiculopathy, lumbar region: Secondary | ICD-10-CM | POA: Diagnosis not present

## 2022-08-28 DIAGNOSIS — Z7951 Long term (current) use of inhaled steroids: Secondary | ICD-10-CM | POA: Diagnosis not present

## 2022-08-28 DIAGNOSIS — I10 Essential (primary) hypertension: Secondary | ICD-10-CM | POA: Diagnosis not present

## 2022-08-28 DIAGNOSIS — M0589 Other rheumatoid arthritis with rheumatoid factor of multiple sites: Secondary | ICD-10-CM | POA: Diagnosis not present

## 2022-08-30 DIAGNOSIS — J438 Other emphysema: Secondary | ICD-10-CM | POA: Diagnosis not present

## 2022-08-30 DIAGNOSIS — M5126 Other intervertebral disc displacement, lumbar region: Secondary | ICD-10-CM | POA: Diagnosis not present

## 2022-08-30 DIAGNOSIS — M792 Neuralgia and neuritis, unspecified: Secondary | ICD-10-CM | POA: Diagnosis not present

## 2022-09-02 ENCOUNTER — Emergency Department (HOSPITAL_COMMUNITY)
Admission: EM | Admit: 2022-09-02 | Discharge: 2022-09-02 | Disposition: A | Discharge status: Home / Self Care | Payer: BC Managed Care – PPO | Attending: Emergency Medicine | Admitting: Emergency Medicine

## 2022-09-02 ENCOUNTER — Encounter (HOSPITAL_COMMUNITY): Payer: Self-pay

## 2022-09-02 ENCOUNTER — Other Ambulatory Visit: Payer: Self-pay

## 2022-09-02 DIAGNOSIS — M792 Neuralgia and neuritis, unspecified: Secondary | ICD-10-CM | POA: Diagnosis not present

## 2022-09-02 DIAGNOSIS — M79672 Pain in left foot: Secondary | ICD-10-CM | POA: Insufficient documentation

## 2022-09-02 DIAGNOSIS — Z7901 Long term (current) use of anticoagulants: Secondary | ICD-10-CM | POA: Insufficient documentation

## 2022-09-02 MED ORDER — HYDROMORPHONE HCL 1 MG/ML IJ SOLN
1.0000 mg | Freq: Once | INTRAMUSCULAR | Status: AC
  Administered 2022-09-02: 1 mg via INTRAMUSCULAR
  Filled 2022-09-02: qty 1

## 2022-09-02 MED ORDER — ONDANSETRON 8 MG PO TBDP
8.0000 mg | ORAL_TABLET | Freq: Once | ORAL | Status: AC
  Administered 2022-09-02: 8 mg via ORAL
  Filled 2022-09-02: qty 1

## 2022-09-02 NOTE — ED Triage Notes (Signed)
Pt arrived via POV from home c/o recurring left foot pain and swelling. Pt seen at [facility] for back surgery, recent stents, and was seen here earlier this month for same complaint.

## 2022-09-02 NOTE — ED Notes (Signed)
ED Provider at bedside. 

## 2022-09-02 NOTE — ED Provider Notes (Signed)
[facility] Provider Note   CSN: [id number] Arrival date & time: 09/02/22  0203     History  Chief Complaint  Patient presents with   Foot Pain    Micheal Serrano is a 67 y.o. male.  Patient presents to the emergency department for evaluation of left foot pain.  Patient reports pain across the dorsal aspect of his foot at the base of the toes.  Patient has had multiple lumbar surgeries.  He had a surgery earlier this year and when he woke up from the surgery this pain was present.  It has been present to some extent since then.  Patient had a repeat surgery this past week and now the pain is worse.  He has been told by his neurosurgeon that it is not related to the surgery.       Home Medications Prior to Admission medications   Medication Sig Start Date End Date Taking? Authorizing Provider  acetaminophen (TYLENOL) 325 MG tablet Take 650 mg by mouth every 6 (six) hours as needed for mild pain.    [provider]  apixaban (ELIQUIS) 5 MG TABS tablet Take 1 tablet (5 mg total) by mouth 2 (two) times daily. 09/25/20 12/24/20  Manuella Ghazi, Pratik D, DO  Cyanocobalamin (VITAMIN B-12 PO) Take by mouth.    [provider]  folic acid (FOLVITE) 1 MG tablet Take 1 mg by mouth daily. 05/16/21   [provider]  HUMIRA PEN 40 MG/0.4ML PNKT Inject 40 mg into the skin every 14 (fourteen) days. 08/16/20   [provider]  HYDROcodone-acetaminophen (NORCO/VICODIN) 5-325 MG tablet Take 2 tablets by mouth every 6 (six) hours as needed for moderate pain. 09/13/21 09/13/22  Fransico Meadow, PA-C  olmesartan (BENICAR) 40 MG tablet Take 40 mg by mouth daily.    [provider]  predniSONE (DELTASONE) 50 MG tablet One tablet a day 09/13/21   Fransico Meadow, PA-C  thiamine 100 MG tablet Take 100 mg by mouth daily. 12/28/20   [provider]      Allergies    Patient has no known allergies.    Review of Systems    Review of Systems  Physical Exam Updated Vital Signs BP 123/68   Pulse 75   Temp 98.6 F (37 C) (Oral)   Resp 18   Ht 5\' 6"  (1.676 m)   Wt 70 kg   SpO2 97%   BMI 24.91 kg/m  Physical Exam Vitals and nursing note reviewed.  Constitutional:      General: He is not in acute distress.    Appearance: He is well-developed.  HENT:     Head: Normocephalic and atraumatic.     Mouth/Throat:     Mouth: Mucous membranes are moist.  Eyes:     General: Vision grossly intact. Gaze aligned appropriately.     Extraocular Movements: Extraocular movements intact.     Conjunctiva/sclera: Conjunctivae normal.  Cardiovascular:     Rate and Rhythm: Normal rate and regular rhythm.     Pulses: Normal pulses.     Heart sounds: Normal heart sounds, S1 normal and S2 normal. No murmur heard.    No friction rub. No gallop.  Pulmonary:     Effort: Pulmonary effort is normal. No respiratory distress.     Breath sounds: Normal breath sounds.  Abdominal:     Palpations: Abdomen is soft.     Tenderness: There is no abdominal tenderness. There is no  guarding or rebound.     Hernia: No hernia is present.  Musculoskeletal:        General: No swelling.     Cervical back: Full passive range of motion without pain, normal range of motion and neck supple. No pain with movement, spinous process tenderness or muscular tenderness. Normal range of motion.     Right lower leg: No edema.     Left lower leg: No edema.     Left foot: Tenderness (Tenderness to palpation and light touch across the dorsal aspect of the foot at the base of the toes) present.  Skin:    General: Skin is warm and dry.     Capillary Refill: Capillary refill takes less than 2 seconds.     Findings: No ecchymosis, erythema, lesion or wound.  Neurological:     Mental Status: He is alert and oriented to person, place, and time.     GCS: GCS eye subscore is 4. GCS verbal subscore is 5. GCS motor subscore is 6.     Cranial Nerves: Cranial  nerves 2-12 are intact.     Sensory: Sensation is intact.     Motor: Motor function is intact. No weakness or abnormal muscle tone.     Coordination: Coordination is intact.  Psychiatric:        Mood and Affect: Mood normal.        Speech: Speech normal.        Behavior: Behavior normal.     ED Results / Procedures / Treatments   Labs (all labs ordered are listed, but only abnormal results are displayed) Labs Reviewed - No data to display  EKG None  Radiology No results found.  Procedures Procedures    Medications Ordered in ED Medications  HYDROmorphone (DILAUDID) injection 1 mg (has no administration in time range)  ondansetron (ZOFRAN-ODT) disintegrating tablet 8 mg (has no administration in time range)    ED Course/ Medical Decision Making/ A&P                             Medical Decision Making  Patient here with increased pain in his left foot.  He first felt this pain after lumbar surgery earlier in the year.  He is on gabapentin.  He reports that he had follow-up with his neurosurgeon after that surgery and had an EMG that did not show any nerve damage.  Patient has had prior stenting of his iliac arteries.  Examination reveals bounding dorsalis pedal pulse, no concern for limb ischemia.  There is no overlying redness, warmth.  There is nothing to indicate infection.  Patient is on Eliquis.  No calf swelling, tenderness to suggest DVT.  Patient has tenderness to even light touch in the region.  I suspect that this is neuropathic pain.  He is already on gabapentin.  He also has just started buprenorphine patches this morning.  I suspect that it will take some time for this to take effect.  He was told to use his oxycodone with this but he has not been using it.  I will administer analgesia here in the form of Dilaudid, discharge patient.  He is to follow-up with his pain management specialist Monday by phone for further instructions.        Final Clinical  Impression(s) / ED Diagnoses Final diagnoses:  Left foot pain  Neuropathic pain    Rx / DC Orders ED Discharge Orders  None         Orpah Greek, MD 09/02/22 [phone]

## 2022-09-04 ENCOUNTER — Telehealth: Payer: Self-pay | Admitting: Orthopedic Surgery

## 2022-09-04 NOTE — Telephone Encounter (Signed)
Returned [place], the spouses call [phone] regarding the patient who was seen in the ED for his left foot, lvm for her to call me back.

## 2022-09-11 ENCOUNTER — Encounter (INDEPENDENT_AMBULATORY_CARE_PROVIDER_SITE_OTHER): Payer: Self-pay | Admitting: *Deleted

## 2022-09-12 ENCOUNTER — Ambulatory Visit: Payer: BC Managed Care – PPO | Admitting: Orthopaedic Surgery

## 2022-09-12 DIAGNOSIS — M1000 Idiopathic gout, unspecified site: Secondary | ICD-10-CM | POA: Diagnosis not present

## 2022-09-12 DIAGNOSIS — M0579 Rheumatoid arthritis with rheumatoid factor of multiple sites without organ or systems involvement: Secondary | ICD-10-CM | POA: Diagnosis not present

## 2022-09-12 DIAGNOSIS — R768 Other specified abnormal immunological findings in serum: Secondary | ICD-10-CM | POA: Diagnosis not present

## 2022-09-13 ENCOUNTER — Ambulatory Visit: Payer: BC Managed Care – PPO | Admitting: Orthopaedic Surgery

## 2022-09-14 DIAGNOSIS — M79605 Pain in left leg: Secondary | ICD-10-CM | POA: Diagnosis not present

## 2022-09-14 DIAGNOSIS — I739 Peripheral vascular disease, unspecified: Secondary | ICD-10-CM | POA: Diagnosis not present

## 2022-09-14 DIAGNOSIS — Z48812 Encounter for surgical aftercare following surgery on the circulatory system: Secondary | ICD-10-CM | POA: Diagnosis not present

## 2022-09-19 DIAGNOSIS — M79672 Pain in left foot: Secondary | ICD-10-CM | POA: Diagnosis not present

## 2022-09-23 ENCOUNTER — Emergency Department (HOSPITAL_COMMUNITY)
Admission: EM | Admit: 2022-09-23 | Discharge: 2022-09-23 | Disposition: A | Discharge status: Home / Self Care | Payer: BC Managed Care – PPO | Attending: Emergency Medicine | Admitting: Emergency Medicine

## 2022-09-23 ENCOUNTER — Emergency Department (HOSPITAL_COMMUNITY): Payer: BC Managed Care – PPO

## 2022-09-23 ENCOUNTER — Other Ambulatory Visit: Payer: Self-pay

## 2022-09-23 DIAGNOSIS — I10 Essential (primary) hypertension: Secondary | ICD-10-CM | POA: Insufficient documentation

## 2022-09-23 DIAGNOSIS — M4316 Spondylolisthesis, lumbar region: Secondary | ICD-10-CM | POA: Diagnosis not present

## 2022-09-23 DIAGNOSIS — M79672 Pain in left foot: Secondary | ICD-10-CM | POA: Diagnosis not present

## 2022-09-23 DIAGNOSIS — Z20822 Contact with and (suspected) exposure to covid-19: Secondary | ICD-10-CM | POA: Insufficient documentation

## 2022-09-23 DIAGNOSIS — T8189XA Other complications of procedures, not elsewhere classified, initial encounter: Secondary | ICD-10-CM | POA: Diagnosis not present

## 2022-09-23 DIAGNOSIS — Y69 Unspecified misadventure during surgical and medical care: Secondary | ICD-10-CM | POA: Insufficient documentation

## 2022-09-23 DIAGNOSIS — T8149XA Infection following a procedure, other surgical site, initial encounter: Secondary | ICD-10-CM | POA: Diagnosis not present

## 2022-09-23 DIAGNOSIS — F17200 Nicotine dependence, unspecified, uncomplicated: Secondary | ICD-10-CM | POA: Insufficient documentation

## 2022-09-23 DIAGNOSIS — R531 Weakness: Secondary | ICD-10-CM | POA: Diagnosis not present

## 2022-09-23 DIAGNOSIS — Z9889 Other specified postprocedural states: Secondary | ICD-10-CM | POA: Diagnosis not present

## 2022-09-23 DIAGNOSIS — Z7901 Long term (current) use of anticoagulants: Secondary | ICD-10-CM | POA: Diagnosis not present

## 2022-09-23 DIAGNOSIS — R059 Cough, unspecified: Secondary | ICD-10-CM | POA: Diagnosis not present

## 2022-09-23 DIAGNOSIS — R52 Pain, unspecified: Secondary | ICD-10-CM | POA: Diagnosis not present

## 2022-09-23 LAB — CBC WITH DIFFERENTIAL/PLATELET
Abs Immature Granulocytes: 0.05 10*3/uL (ref 0.00–0.07)
Basophils Absolute: 0.1 10*3/uL (ref 0.0–0.1)
Basophils Relative: 0 %
Eosinophils Absolute: 0 10*3/uL (ref 0.0–0.5)
Eosinophils Relative: 0 %
HCT: 40.5 % (ref 39.0–52.0)
Hemoglobin: 14 g/dL (ref 13.0–17.0)
Immature Granulocytes: 0 %
Lymphocytes Relative: 18 %
Lymphs Abs: 2 10*3/uL (ref 0.7–4.0)
MCH: 31.7 pg (ref 26.0–34.0)
MCHC: 34.6 g/dL (ref 30.0–36.0)
MCV: 91.8 fL (ref 80.0–100.0)
Monocytes Absolute: 2 10*3/uL — ABNORMAL HIGH (ref 0.1–1.0)
Monocytes Relative: 18 %
Neutro Abs: 7.3 10*3/uL (ref 1.7–7.7)
Neutrophils Relative %: 64 %
Platelets: 205 10*3/uL (ref 150–400)
RBC: 4.41 MIL/uL (ref 4.22–5.81)
RDW: 12.4 % (ref 11.5–15.5)
WBC: 11.4 10*3/uL — ABNORMAL HIGH (ref 4.0–10.5)
nRBC: 0 % (ref 0.0–0.2)

## 2022-09-23 LAB — URINALYSIS, ROUTINE W REFLEX MICROSCOPIC
Bacteria, UA: NONE SEEN
Bilirubin Urine: NEGATIVE
Glucose, UA: NEGATIVE mg/dL
Ketones, ur: NEGATIVE mg/dL
Leukocytes,Ua: NEGATIVE
Nitrite: NEGATIVE
Protein, ur: NEGATIVE mg/dL
Specific Gravity, Urine: 1.004 — ABNORMAL LOW (ref 1.005–1.030)
pH: 7 (ref 5.0–8.0)

## 2022-09-23 LAB — COMPREHENSIVE METABOLIC PANEL
ALT: 16 U/L (ref 0–44)
AST: 21 U/L (ref 15–41)
Albumin: 3 g/dL — ABNORMAL LOW (ref 3.5–5.0)
Alkaline Phosphatase: 53 U/L (ref 38–126)
Anion gap: 10 (ref 5–15)
BUN: 6 mg/dL — ABNORMAL LOW (ref 8–23)
CO2: 22 mmol/L (ref 22–32)
Calcium: 8.8 mg/dL — ABNORMAL LOW (ref 8.9–10.3)
Chloride: 100 mmol/L (ref 98–111)
Creatinine, Ser: 0.61 mg/dL (ref 0.61–1.24)
GFR, Estimated: >60 mL/min (ref >60)
Glucose, Bld: 110 mg/dL — ABNORMAL HIGH (ref 70–99)
Potassium: 3.4 mmol/L — ABNORMAL LOW (ref 3.5–5.1)
Sodium: 132 mmol/L — ABNORMAL LOW (ref 135–145)
Total Bilirubin: 1.2 mg/dL (ref 0.3–1.2)
Total Protein: 7.2 g/dL (ref 6.5–8.1)

## 2022-09-23 LAB — LACTIC ACID, PLASMA: Lactic Acid, Venous: 0.9 mmol/L (ref 0.5–1.9)

## 2022-09-23 LAB — RESP PANEL BY RT-PCR (RSV, FLU A&B, COVID)  RVPGX2
Influenza A by PCR: NEGATIVE
Influenza B by PCR: NEGATIVE
Resp Syncytial Virus by PCR: NEGATIVE
SARS Coronavirus 2 by RT PCR: NEGATIVE

## 2022-09-23 MED ORDER — SODIUM CHLORIDE 0.9 % IV BOLUS
500.0000 mL | Freq: Once | INTRAVENOUS | Status: AC
  Administered 2022-09-23: 500 mL via INTRAVENOUS

## 2022-09-23 MED ORDER — MORPHINE SULFATE (PF) 4 MG/ML IV SOLN
4.0000 mg | Freq: Once | INTRAVENOUS | Status: AC
  Administered 2022-09-23: 4 mg via INTRAVENOUS
  Filled 2022-09-23: qty 1

## 2022-09-23 MED ORDER — CIPROFLOXACIN HCL 250 MG PO TABS
500.0000 mg | ORAL_TABLET | Freq: Once | ORAL | Status: AC
  Administered 2022-09-23: 500 mg via ORAL
  Filled 2022-09-23: qty 2

## 2022-09-23 MED ORDER — CIPROFLOXACIN HCL 500 MG PO TABS: 500.0000 mg | ORAL_TABLET | Freq: Two times a day (BID) | ORAL | 0 refills | Status: DC

## 2022-09-23 MED ORDER — HYDROMORPHONE HCL 1 MG/ML IJ SOLN
1.0000 mg | Freq: Once | INTRAMUSCULAR | Status: AC
  Administered 2022-09-23: 1 mg via INTRAVENOUS
  Filled 2022-09-23: qty 1

## 2022-09-23 MED ORDER — ONDANSETRON HCL 4 MG/2ML IJ SOLN
4.0000 mg | Freq: Once | INTRAMUSCULAR | Status: AC
  Administered 2022-09-23: 4 mg via INTRAVENOUS
  Filled 2022-09-23: qty 2

## 2022-09-23 MED ORDER — DOXYCYCLINE HYCLATE 100 MG PO TABS
100.0000 mg | ORAL_TABLET | Freq: Once | ORAL | Status: AC
  Administered 2022-09-23: 100 mg via ORAL
  Filled 2022-09-23: qty 1

## 2022-09-23 MED ORDER — DOXYCYCLINE HYCLATE 100 MG PO CAPS: 100.0000 mg | ORAL_CAPSULE | Freq: Two times a day (BID) | ORAL | 0 refills | Status: DC

## 2022-09-23 NOTE — ED Triage Notes (Signed)
Patient BIB EMS from home for c/o diffuse joint pain and drainage from a surgical site. He states he had L5-S1 fused in March. + discolored drainage noted .

## 2022-09-23 NOTE — Discharge Instructions (Addendum)
Stop Doxycycline and take Cipro per Dr. Renard Matter request.

## 2022-09-23 NOTE — ED Provider Notes (Signed)
[name] Provider Note   CSN: [id number] Arrival date & time: 09/23/22  1029     History  Chief Complaint  Patient presents with   Joint Pain   Post-op Problem    Micheal Serrano is a 67 y.o. male.  Pt is a 67 yo male with pmhx significant for htn, gerd, hep c (treated), PE (on Eliquis), polycythemia vera, hx tobacco and alcohol abuse.  Pt had a L5-S1 microdiscectomy on 3/18.  He has still had residual pain to his left foot.  He has seen vascular as well and has required bilateral common iliac artery stents and a left external iliac stent in March.  He comes in today because he has pain everywhere.  His wife noticed some drainage from his surgical site today.  He had a nose bleed last night (self limited) and has had a cough.         Home Medications Prior to Admission medications   Medication Sig Start Date End Date Taking? Authorizing Provider  ciprofloxacin (CIPRO) 500 MG tablet Take 1 tablet (500 mg total) by mouth 2 (two) times daily. 09/23/22  Yes Jacalyn Lefevre, MD  doxycycline (VIBRAMYCIN) 100 MG capsule Take 1 capsule (100 mg total) by mouth 2 (two) times daily. 09/23/22  Yes Jacalyn Lefevre, MD  acetaminophen (TYLENOL) 325 MG tablet Take 650 mg by mouth every 6 (six) hours as needed for mild pain.    [provider]  apixaban (ELIQUIS) 5 MG TABS tablet Take 1 tablet (5 mg total) by mouth 2 (two) times daily. 09/25/20 12/24/20  Sherryll Burger, Pratik D, DO  Cyanocobalamin (VITAMIN B-12 PO) Take by mouth.    [provider]  folic acid (FOLVITE) 1 MG tablet Take 1 mg by mouth daily. 05/16/21   [provider]  HUMIRA PEN 40 MG/0.4ML PNKT Inject 40 mg into the skin every 14 (fourteen) days. 08/16/20   [provider]  olmesartan (BENICAR) 40 MG tablet Take 40 mg by mouth daily.    [provider]  predniSONE (DELTASONE) 50 MG tablet One tablet a day 09/13/21   Elson Areas, PA-C  thiamine 100 MG  tablet Take 100 mg by mouth daily. 12/28/20   [provider]      Allergies    Patient has no known allergies.    Review of Systems   Review of Systems  Musculoskeletal:  Positive for myalgias.  Skin:  Positive for wound.  All other systems reviewed and are negative.   Physical Exam Updated Vital Signs BP 123/70   Pulse 76   Temp 98.7 F (37.1 C) (Oral)   Resp 20   SpO2 92%  Physical Exam Vitals and nursing note reviewed.  Constitutional:      Appearance: Normal appearance.  HENT:     Head: Normocephalic and atraumatic.     Right Ear: External ear normal.     Left Ear: External ear normal.     Nose: Nose normal.     Mouth/Throat:     Mouth: Mucous membranes are moist.     Pharynx: Oropharynx is clear.  Eyes:     Extraocular Movements: Extraocular movements intact.     Conjunctiva/sclera: Conjunctivae normal.     Pupils: Pupils are equal, round, and reactive to light.  Cardiovascular:     Rate and Rhythm: Normal rate and regular rhythm.     Pulses: Normal pulses.     Heart sounds: Normal heart sounds.  Pulmonary:     Effort: Pulmonary effort is normal.     Breath sounds: Normal breath sounds.  Abdominal:     General: Abdomen is flat. Bowel sounds are normal.     Palpations: Abdomen is soft.  Musculoskeletal:        General: Normal range of motion.     Cervical back: Normal range of motion and neck supple.  Skin:    General: Skin is warm.     Capillary Refill: Capillary refill takes less than 2 seconds.     Comments: Pt does have some drainage from his surgical site to his back.  It seems to be a superficial abscess.  Wound milked and drained more.  Neurological:     General: No focal deficit present.     Mental Status: He is alert and oriented to person, place, and time.  Psychiatric:        Mood and Affect: Mood normal.        Behavior: Behavior normal.     ED Results / Procedures / Treatments   Labs (all labs ordered are listed, but only  abnormal results are displayed) Labs Reviewed  CBC WITH DIFFERENTIAL/PLATELET - Abnormal; Notable for the following components:      Result Value   WBC 11.4 (*)    Monocytes Absolute 2.0 (*)    All other components within normal limits  COMPREHENSIVE METABOLIC PANEL - Abnormal; Notable for the following components:   Sodium 132 (*)    Potassium 3.4 (*)    Glucose, Bld 110 (*)    BUN 6 (*)    Calcium 8.8 (*)    Albumin 3.0 (*)    All other components within normal limits  URINALYSIS, ROUTINE W REFLEX MICROSCOPIC - Abnormal; Notable for the following components:   Specific Gravity, Urine 1.004 (*)    Hgb urine dipstick MODERATE (*)    All other components within normal limits  CULTURE, BLOOD (ROUTINE X 2)  CULTURE, BLOOD (ROUTINE X 2)  RESP PANEL BY RT-PCR (RSV, FLU A&B, COVID)  [id number]  LACTIC ACID, PLASMA  CBG MONITORING, ED    EKG EKG Interpretation  Date/Time:  Saturday September 23 2022 11:20:35 EDT Ventricular Rate:  72 PR Interval:  134 QRS Duration: 95 QT Interval:  407 QTC Calculation: 446 R Axis:   84 Text Interpretation: Sinus rhythm Atrial premature complex Borderline right axis deviation No significant change since last tracing Confirmed by Jacalyn Lefevre [phone]) on 09/23/2022 11:39:29 AM  Radiology CT Lumbar Spine Wo Contrast  Result Date: 09/23/2022 CLINICAL DATA:  Spine surgery/procedure, postop, infection suspected EXAM: CT LUMBAR SPINE WITHOUT CONTRAST TECHNIQUE: Multidetector CT imaging of the lumbar spine was performed without intravenous contrast administration. Multiplanar CT image reconstructions were also generated. RADIATION DOSE REDUCTION: This exam was performed according to the departmental dose-optimization program which includes automated exposure control, adjustment of the mA and/or kV according to patient size and/or use of iterative reconstruction technique. COMPARISON:  MRI 06/03/2020, x-ray 09/13/2020 FINDINGS: Segmentation: 5 lumbar type  vertebrae. Alignment: Minimal grade 1 anterolisthesis of L5 on S1. Vertebrae: Interval fusion of the L4-S1 levels with transpedicular screw and rod fixation. Posterior decompression and bone graft material is present at these levels. Bone loss of the tip of the L5 spinous process could be postsurgical or related to new erosive change (series 5, image 34; series 3, image 96). There also appears to be bone loss along the inferior margin of the L4 spinous process. Hardware appears intact. Endplate sclerosis  associated with the L5-S1 level is chronic related to degenerative disc disease. Paraspinal and other soft tissues: Fluid and gas containing collection within the posterior paraspinal soft tissues of the lower lumbar spine measuring approximately 4.3 x 4.3 cm transaxially and extends craniocaudal length of approximately 6 cm (series 4, image 95). Exact dimensions are difficult given lack of IV contrast and associated metallic streak artifact from patient's hardware. Aortoiliac atherosclerosis with prior stent grafting. Sigmoid diverticulosis. Disc levels: Advanced disc height loss at L5-S1. Mild L4-5 disc height loss. Non fused disc heights are maintained. IMPRESSION: 1. Interval fusion and posterior decompression of the L4-S1 levels. 2. Fluid and gas containing collection within the posterior paraspinal soft tissues of the lower lumbar spine measuring approximately 4.3 x 4.3 x 6.0 cm. Exact dimensions are difficult given lack of IV contrast and associated metallic streak artifact from patient's hardware. An infected postoperative fluid collection is the primary concern. Neurosurgical assessment is recommended. 3. Bone loss of the L4 and L5 spinous processes could be postsurgical or related to new erosive change/osteomyelitis. 4. Aortic atherosclerosis (ICD10-I70.0). Electronically Signed   By: Duanne Guess D.O.   On: 09/23/2022 15:23   DG Chest Portable 1 View  Result Date: 09/23/2022 CLINICAL DATA:  Cough.  EXAM: PORTABLE CHEST 1 VIEW COMPARISON:  CT 04/21/2022 FINDINGS: Heart size is normal. No pleural effusion or edema. No airspace opacities identified. Chronic coarsened interstitial markings identified. Visualized osseous structures are notable for prior ACDF of the lower cervical spine. Mild thoracic scoliosis is convex towards the right. IMPRESSION: 1. No acute cardiopulmonary abnormalities. 2. Chronic interstitial coarsening which may reflect underlying COPD/emphysema. Electronically Signed   By: Signa Kell M.D.   On: 09/23/2022 11:40    Procedures Procedures    Medications Ordered in ED Medications  ciprofloxacin (CIPRO) tablet 500 mg (has no administration in time range)  HYDROmorphone (DILAUDID) injection 1 mg (has no administration in time range)  morphine (PF) 4 MG/ML injection 4 mg (4 mg Intravenous Given 09/23/22 1156)  ondansetron (ZOFRAN) injection 4 mg (4 mg Intravenous Given 09/23/22 1155)  sodium chloride 0.9 % bolus 500 mL (0 mLs Intravenous Stopped 09/23/22 1241)  doxycycline (VIBRA-TABS) tablet 100 mg (100 mg Oral Given 09/23/22 1157)  HYDROmorphone (DILAUDID) injection 1 mg (1 mg Intravenous Given 09/23/22 1352)    ED Course/ Medical Decision Making/ A&P                             Medical Decision Making Amount and/or Complexity of Data Reviewed Labs: ordered. Radiology: ordered.  Risk Prescription drug management.   This patient presents to the ED for concern of diffuse myalgias, this involves an extensive number of treatment options, and is a complaint that carries with it a high risk of complications and morbidity.  The differential diagnosis includes sepsis, superficial infection, electrolyte abn   Co morbidities that complicate the patient evaluation  htn, gerd, hep c (treated), PE (on Eliquis), polycythemia vera, hx tobacco and alcohol abuse   Additional history obtained:  Additional history obtained from epic chart review External records from  outside source obtained and reviewed including EMS report   Lab Tests:  I Ordered, and personally interpreted labs.  The pertinent results include:  cbc nl, cmp nl, ua nl, covid/flu/rsv neg   Imaging Studies ordered:  I ordered imaging studies including cxr and ct lumbar  I independently visualized and interpreted imaging which showed  CXR:  No acute cardiopulmonary  abnormalities.  2. Chronic interstitial coarsening which may reflect underlying  COPD/emphysema.  Lumbar CT:  Interval fusion and posterior decompression of the L4-S1 levels.  2. Fluid and gas containing collection within the posterior  paraspinal soft tissues of the lower lumbar spine measuring  approximately 4.3 x 4.3 x 6.0 cm. Exact dimensions are difficult  given lack of IV contrast and associated metallic streak artifact  from patient's hardware. An infected postoperative fluid collection  is the primary concern. Neurosurgical assessment is recommended.  3. Bone loss of the L4 and L5 spinous processes could be  postsurgical or related to new erosive change/osteomyelitis.  4. Aortic atherosclerosis (ICD10-I70.0).   I agree with the radiologist interpretation   Cardiac Monitoring:  The patient was maintained on a cardiac monitor.  I personally viewed and interpreted the cardiac monitored which showed an underlying rhythm of: nsr   Medicines ordered and prescription drug management:  I ordered medication including morphine/dilaudid  for pain  Reevaluation of the patient after these medicines showed that the patient improved I have reviewed the patients home medicines and have made adjustments as needed   Test Considered:  ct   Critical Interventions:  Pain control   Consultations Obtained:  I requested consultation with the NS (Dr. Lorenso Courier),  and discussed lab and imaging findings as well as pertinent plan - he recommends Cipro abx.  Pt has an appt with him on Tuesday, 4/16.  Pt is to keep that  appt.   Problem List / ED Course:  Post-op wound infection/?osteo:  abscess is now draining.  He is put on Cipro per Dr. Lorenso Courier request.  He is to f/u on Tuesday as scheduled.  Pt is to return if worse.    Reevaluation:  After the interventions noted above, I reevaluated the patient and found that they have :improved   Social Determinants of Health:  Lives at home with wife   Dispostion:  After consideration of the diagnostic results and the patients response to treatment, I feel that the patent would benefit from discharge with outpatient f/u.          Final Clinical Impression(s) / ED Diagnoses Final diagnoses:  Wound infection after surgery    Rx / DC Orders ED Discharge Orders          Ordered    doxycycline (VIBRAMYCIN) 100 MG capsule  2 times daily        09/23/22 1415    ciprofloxacin (CIPRO) 500 MG tablet  2 times daily        09/23/22 1545              Jacalyn Lefevre, MD 09/23/22 1548

## 2022-09-24 LAB — BLOOD CULTURE ID PANEL (REFLEXED) - BCID2

## 2022-09-24 LAB — CULTURE, BLOOD (ROUTINE X 2)

## 2022-09-24 NOTE — ED Provider Notes (Signed)
Lab called with + blood cultures with gram + cocci.  As source of infection could be staph, he is called and told to be re-evaluated.  He will need a MRI to make sure he does not have discitis or osteomyelitis.  We don't have that available here at [facility].  Ideally, he should go to [place] where he had his surgery. If not, he needs to go to [facility] where MRI is available.   Jacalyn Lefevre, MD 09/24/22 1344

## 2022-09-24 NOTE — ED Notes (Signed)
Pt contacted by this nurse per Dr.Haviland Request. Pt has gram positive cocci in blood cultures. MD recommends patient go to [facility] ER where the pts neuro surgeon works and if not then go to [facility] ED for MRI and future work up.   PT and wife both verbalize understanding of this.

## 2022-09-24 NOTE — ED Notes (Signed)
Dr.Miller made aware of pts new positive results of gram positive cocci staph aureus no resistance mechanisms detected.   As previously noted, pt was told today by this nurse he needs to go to the ER for further work up. Dr.Miller aware

## 2022-09-25 LAB — CULTURE, BLOOD (ROUTINE X 2)

## 2022-09-26 LAB — CULTURE, BLOOD (ROUTINE X 2): Special Requests: ADEQUATE

## 2022-09-27 ENCOUNTER — Other Ambulatory Visit (HOSPITAL_COMMUNITY): Payer: Self-pay | Admitting: Neurological Surgery

## 2022-09-27 DIAGNOSIS — M545 Low back pain, unspecified: Secondary | ICD-10-CM

## 2022-09-27 DIAGNOSIS — M5416 Radiculopathy, lumbar region: Secondary | ICD-10-CM

## 2022-09-27 DIAGNOSIS — G894 Chronic pain syndrome: Secondary | ICD-10-CM

## 2022-09-28 ENCOUNTER — Telehealth (HOSPITAL_BASED_OUTPATIENT_CLINIC_OR_DEPARTMENT_OTHER): Payer: Self-pay | Admitting: *Deleted

## 2022-09-28 NOTE — Telephone Encounter (Signed)
Post ED Visit - Positive Culture Follow-up  Culture report reviewed by antimicrobial stewardship pharmacist: [facility]  [facility], Pharm.D.  Celedonio Miyamoto, [address].D., BCPS AQ-ID  Garvin Fila, Pharm.D., BCPS  Georgina Pillion, [address].D., BCPS  Marceline, [address].D., BCPS, AAHIVP  Estella Husk, Pharm.D., BCPS, AAHIVP  Lysle Pearl, PharmD, BCPS  Phillips Climes, PharmD, BCPS  Agapito Games, PharmD, BCPS  Verlan Friends, PharmD  Mervyn Gay, PharmD, BCPS  Vinnie Level, PharmD  [facility]  Len Childs, PharmD  Greer Pickerel, PharmD  Adalberto Cole, PharmD  Perlie Gold, Rph  Lonell Face) Jean Rosenthal, PharmD  Earl Many, PharmD  Junita Push, PharmD  Dorna Leitz, PharmD  Terrilee Files, PharmD  Lynann Beaver, PharmD  Keturah Barre, PharmD  Loralee Pacas, PharmD  Bernadene Person, PharmD   Positive blood culture Treated with Ciprofloxacin HCL, Doxycycline Hyclate, organism sensitive to the same and no further patient follow-up is required at this time.  Wilburn Cornelia, Pharm D  Lysle Pearl 09/28/2022, 8:10 AM

## 2022-09-29 ENCOUNTER — Ambulatory Visit (HOSPITAL_BASED_OUTPATIENT_CLINIC_OR_DEPARTMENT_OTHER)
Admission: RE | Admit: 2022-09-29 | Discharge: 2022-09-29 | Disposition: A | Discharge status: Home / Self Care | Payer: BC Managed Care – PPO | Source: Ambulatory Visit | Attending: Neurological Surgery | Admitting: Neurological Surgery

## 2022-09-29 DIAGNOSIS — M5416 Radiculopathy, lumbar region: Secondary | ICD-10-CM

## 2022-09-29 DIAGNOSIS — Z79620 Long term (current) use of immunosuppressive biologic: Secondary | ICD-10-CM | POA: Diagnosis not present

## 2022-09-29 DIAGNOSIS — M545 Low back pain, unspecified: Secondary | ICD-10-CM

## 2022-09-29 DIAGNOSIS — R7881 Bacteremia: Secondary | ICD-10-CM | POA: Diagnosis not present

## 2022-09-29 DIAGNOSIS — K219 Gastro-esophageal reflux disease without esophagitis: Secondary | ICD-10-CM | POA: Diagnosis not present

## 2022-09-29 DIAGNOSIS — A419 Sepsis, unspecified organism: Secondary | ICD-10-CM | POA: Diagnosis not present

## 2022-09-29 DIAGNOSIS — Z79899 Other long term (current) drug therapy: Secondary | ICD-10-CM | POA: Diagnosis not present

## 2022-09-29 DIAGNOSIS — T8149XA Infection following a procedure, other surgical site, initial encounter: Secondary | ICD-10-CM | POA: Diagnosis not present

## 2022-09-29 DIAGNOSIS — Z7901 Long term (current) use of anticoagulants: Secondary | ICD-10-CM | POA: Diagnosis not present

## 2022-09-29 DIAGNOSIS — Z823 Family history of stroke: Secondary | ICD-10-CM | POA: Diagnosis not present

## 2022-09-29 DIAGNOSIS — Z86711 Personal history of pulmonary embolism: Secondary | ICD-10-CM | POA: Diagnosis not present

## 2022-09-29 DIAGNOSIS — G8929 Other chronic pain: Secondary | ICD-10-CM | POA: Diagnosis not present

## 2022-09-29 DIAGNOSIS — D751 Secondary polycythemia: Secondary | ICD-10-CM | POA: Diagnosis present

## 2022-09-29 DIAGNOSIS — I10 Essential (primary) hypertension: Secondary | ICD-10-CM | POA: Diagnosis not present

## 2022-09-29 DIAGNOSIS — G894 Chronic pain syndrome: Secondary | ICD-10-CM | POA: Insufficient documentation

## 2022-09-29 DIAGNOSIS — R200 Anesthesia of skin: Secondary | ICD-10-CM | POA: Diagnosis present

## 2022-09-29 DIAGNOSIS — E876 Hypokalemia: Secondary | ICD-10-CM | POA: Diagnosis present

## 2022-09-29 DIAGNOSIS — Z803 Family history of malignant neoplasm of breast: Secondary | ICD-10-CM | POA: Diagnosis not present

## 2022-09-29 DIAGNOSIS — Z8619 Personal history of other infectious and parasitic diseases: Secondary | ICD-10-CM | POA: Diagnosis not present

## 2022-09-29 DIAGNOSIS — B1920 Unspecified viral hepatitis C without hepatic coma: Secondary | ICD-10-CM | POA: Diagnosis not present

## 2022-09-29 DIAGNOSIS — Z8349 Family history of other endocrine, nutritional and metabolic diseases: Secondary | ICD-10-CM | POA: Diagnosis not present

## 2022-09-29 DIAGNOSIS — Z981 Arthrodesis status: Secondary | ICD-10-CM | POA: Diagnosis not present

## 2022-09-29 DIAGNOSIS — F17210 Nicotine dependence, cigarettes, uncomplicated: Secondary | ICD-10-CM | POA: Diagnosis not present

## 2022-09-29 DIAGNOSIS — B9561 Methicillin susceptible Staphylococcus aureus infection as the cause of diseases classified elsewhere: Secondary | ICD-10-CM | POA: Diagnosis not present

## 2022-09-29 DIAGNOSIS — F1020 Alcohol dependence, uncomplicated: Secondary | ICD-10-CM | POA: Diagnosis not present

## 2022-09-29 DIAGNOSIS — I739 Peripheral vascular disease, unspecified: Secondary | ICD-10-CM | POA: Diagnosis not present

## 2022-09-29 DIAGNOSIS — M069 Rheumatoid arthritis, unspecified: Secondary | ICD-10-CM | POA: Diagnosis not present

## 2022-09-29 DIAGNOSIS — Z452 Encounter for adjustment and management of vascular access device: Secondary | ICD-10-CM | POA: Diagnosis not present

## 2022-09-29 DIAGNOSIS — T8142XA Infection following a procedure, deep incisional surgical site, initial encounter: Secondary | ICD-10-CM | POA: Diagnosis not present

## 2022-09-29 MED ORDER — GADOBUTROL 1 MMOL/ML IV SOLN
7.0000 mL | Freq: Once | INTRAVENOUS | Status: AC | PRN
  Administered 2022-09-29: 7 mL via INTRAVENOUS
  Filled 2022-09-29: qty 7.5

## 2022-10-02 ENCOUNTER — Emergency Department (HOSPITAL_COMMUNITY): Payer: BC Managed Care – PPO

## 2022-10-02 ENCOUNTER — Encounter (HOSPITAL_COMMUNITY): Payer: Self-pay | Admitting: *Deleted

## 2022-10-02 ENCOUNTER — Inpatient Hospital Stay (HOSPITAL_COMMUNITY)
Admission: EM | Admit: 2022-10-02 | Discharge: 2022-10-05 | DRG: 863 | Disposition: A | Discharge status: Home Health | Payer: BC Managed Care – PPO | Attending: Internal Medicine | Admitting: Internal Medicine

## 2022-10-02 ENCOUNTER — Other Ambulatory Visit: Payer: Self-pay

## 2022-10-02 DIAGNOSIS — I10 Essential (primary) hypertension: Secondary | ICD-10-CM | POA: Diagnosis present

## 2022-10-02 DIAGNOSIS — D751 Secondary polycythemia: Secondary | ICD-10-CM | POA: Diagnosis present

## 2022-10-02 DIAGNOSIS — Z79620 Long term (current) use of immunosuppressive biologic: Secondary | ICD-10-CM

## 2022-10-02 DIAGNOSIS — Z8349 Family history of other endocrine, nutritional and metabolic diseases: Secondary | ICD-10-CM

## 2022-10-02 DIAGNOSIS — B9561 Methicillin susceptible Staphylococcus aureus infection as the cause of diseases classified elsewhere: Secondary | ICD-10-CM | POA: Diagnosis present

## 2022-10-02 DIAGNOSIS — F17210 Nicotine dependence, cigarettes, uncomplicated: Secondary | ICD-10-CM | POA: Diagnosis present

## 2022-10-02 DIAGNOSIS — Z79899 Other long term (current) drug therapy: Secondary | ICD-10-CM | POA: Diagnosis not present

## 2022-10-02 DIAGNOSIS — G8929 Other chronic pain: Secondary | ICD-10-CM | POA: Diagnosis present

## 2022-10-02 DIAGNOSIS — Z86711 Personal history of pulmonary embolism: Secondary | ICD-10-CM

## 2022-10-02 DIAGNOSIS — Z803 Family history of malignant neoplasm of breast: Secondary | ICD-10-CM | POA: Diagnosis not present

## 2022-10-02 DIAGNOSIS — I739 Peripheral vascular disease, unspecified: Secondary | ICD-10-CM | POA: Diagnosis present

## 2022-10-02 DIAGNOSIS — M545 Low back pain, unspecified: Secondary | ICD-10-CM | POA: Diagnosis present

## 2022-10-02 DIAGNOSIS — M069 Rheumatoid arthritis, unspecified: Secondary | ICD-10-CM | POA: Diagnosis present

## 2022-10-02 DIAGNOSIS — B1920 Unspecified viral hepatitis C without hepatic coma: Secondary | ICD-10-CM | POA: Diagnosis present

## 2022-10-02 DIAGNOSIS — R768 Other specified abnormal immunological findings in serum: Secondary | ICD-10-CM | POA: Diagnosis present

## 2022-10-02 DIAGNOSIS — F1020 Alcohol dependence, uncomplicated: Secondary | ICD-10-CM | POA: Diagnosis present

## 2022-10-02 DIAGNOSIS — K219 Gastro-esophageal reflux disease without esophagitis: Secondary | ICD-10-CM | POA: Diagnosis present

## 2022-10-02 DIAGNOSIS — Z981 Arthrodesis status: Secondary | ICD-10-CM

## 2022-10-02 DIAGNOSIS — Z823 Family history of stroke: Secondary | ICD-10-CM | POA: Diagnosis not present

## 2022-10-02 DIAGNOSIS — T8142XA Infection following a procedure, deep incisional surgical site, initial encounter: Secondary | ICD-10-CM | POA: Diagnosis present

## 2022-10-02 DIAGNOSIS — T8149XA Infection following a procedure, other surgical site, initial encounter: Secondary | ICD-10-CM | POA: Diagnosis not present

## 2022-10-02 DIAGNOSIS — Z8619 Personal history of other infectious and parasitic diseases: Secondary | ICD-10-CM | POA: Diagnosis not present

## 2022-10-02 DIAGNOSIS — Z7901 Long term (current) use of anticoagulants: Secondary | ICD-10-CM | POA: Diagnosis not present

## 2022-10-02 DIAGNOSIS — Z720 Tobacco use: Secondary | ICD-10-CM | POA: Diagnosis present

## 2022-10-02 DIAGNOSIS — E876 Hypokalemia: Secondary | ICD-10-CM | POA: Diagnosis present

## 2022-10-02 DIAGNOSIS — R200 Anesthesia of skin: Secondary | ICD-10-CM | POA: Diagnosis present

## 2022-10-02 DIAGNOSIS — R7881 Bacteremia: Secondary | ICD-10-CM | POA: Diagnosis present

## 2022-10-02 DIAGNOSIS — Z452 Encounter for adjustment and management of vascular access device: Secondary | ICD-10-CM | POA: Diagnosis not present

## 2022-10-02 LAB — COMPREHENSIVE METABOLIC PANEL
ALT: 17 U/L (ref 0–44)
AST: 18 U/L (ref 15–41)
Albumin: 3.5 g/dL (ref 3.5–5.0)
Alkaline Phosphatase: 60 U/L (ref 38–126)
Anion gap: 10 (ref 5–15)
BUN: 6 mg/dL — ABNORMAL LOW (ref 8–23)
CO2: 25 mmol/L (ref 22–32)
Calcium: 9.4 mg/dL (ref 8.9–10.3)
Chloride: 101 mmol/L (ref 98–111)
Creatinine, Ser: 0.69 mg/dL (ref 0.61–1.24)
GFR, Estimated: >60 mL/min (ref >60)
Glucose, Bld: 135 mg/dL — ABNORMAL HIGH (ref 70–99)
Potassium: 3.4 mmol/L — ABNORMAL LOW (ref 3.5–5.1)
Sodium: 136 mmol/L (ref 135–145)
Total Bilirubin: 0.5 mg/dL (ref 0.3–1.2)
Total Protein: 9.3 g/dL — ABNORMAL HIGH (ref 6.5–8.1)

## 2022-10-02 LAB — CBC WITH DIFFERENTIAL/PLATELET
Abs Immature Granulocytes: 0.04 10*3/uL (ref 0.00–0.07)
Basophils Absolute: 0.1 10*3/uL (ref 0.0–0.1)
Basophils Relative: 1 %
Eosinophils Absolute: 0.3 10*3/uL (ref 0.0–0.5)
Eosinophils Relative: 3 %
HCT: 46.6 % (ref 39.0–52.0)
Hemoglobin: 15.4 g/dL (ref 13.0–17.0)
Immature Granulocytes: 1 %
Lymphocytes Relative: 35 %
Lymphs Abs: 2.8 10*3/uL (ref 0.7–4.0)
MCH: 30.7 pg (ref 26.0–34.0)
MCHC: 33 g/dL (ref 30.0–36.0)
MCV: 93 fL (ref 80.0–100.0)
Monocytes Absolute: 0.7 10*3/uL (ref 0.1–1.0)
Monocytes Relative: 9 %
Neutro Abs: 4.2 10*3/uL (ref 1.7–7.7)
Neutrophils Relative %: 51 %
Platelets: 425 10*3/uL — ABNORMAL HIGH (ref 150–400)
RBC: 5.01 MIL/uL (ref 4.22–5.81)
RDW: 12.7 % (ref 11.5–15.5)
WBC: 8 10*3/uL (ref 4.0–10.5)
nRBC: 0 % (ref 0.0–0.2)

## 2022-10-02 LAB — URINALYSIS, ROUTINE W REFLEX MICROSCOPIC
Bilirubin Urine: NEGATIVE
Glucose, UA: NEGATIVE mg/dL
Ketones, ur: NEGATIVE mg/dL
Leukocytes,Ua: NEGATIVE
Nitrite: NEGATIVE
Protein, ur: 30 mg/dL — AB
Specific Gravity, Urine: 1.026 (ref 1.005–1.030)
pH: 5 (ref 5.0–8.0)

## 2022-10-02 LAB — C-REACTIVE PROTEIN: CRP: 1.9 mg/dL — ABNORMAL HIGH (ref <1.0)

## 2022-10-02 LAB — I-STAT CHEM 8, ED
BUN: 4 mg/dL — ABNORMAL LOW (ref 8–23)
Calcium, Ion: 1.2 mmol/L (ref 1.15–1.40)
Chloride: 101 mmol/L (ref 98–111)
Creatinine, Ser: 0.6 mg/dL — ABNORMAL LOW (ref 0.61–1.24)
Glucose, Bld: 131 mg/dL — ABNORMAL HIGH (ref 70–99)
HCT: 50 % (ref 39.0–52.0)
Hemoglobin: 17 g/dL (ref 13.0–17.0)
Potassium: 3.5 mmol/L (ref 3.5–5.1)
Sodium: 139 mmol/L (ref 135–145)
TCO2: 25 mmol/L (ref 22–32)

## 2022-10-02 LAB — LACTIC ACID, PLASMA
Lactic Acid, Venous: 2 mmol/L (ref 0.5–1.9)
Lactic Acid, Venous: 1.7 mmol/L (ref 0.5–1.9)

## 2022-10-02 LAB — PROTIME-INR
INR: 1.3 — ABNORMAL HIGH (ref 0.8–1.2)
Prothrombin Time: 16.4 seconds — ABNORMAL HIGH (ref 11.4–15.2)

## 2022-10-02 LAB — SEDIMENTATION RATE: Sed Rate: 97 mm/hr — ABNORMAL HIGH (ref 0–16)

## 2022-10-02 LAB — HIV ANTIBODY (ROUTINE TESTING W REFLEX): HIV Screen 4th Generation wRfx: NONREACTIVE

## 2022-10-02 MED ORDER — SODIUM CHLORIDE 0.9% FLUSH
3.0000 mL | Freq: Two times a day (BID) | INTRAVENOUS | Status: DC
  Administered 2022-10-02 – 2022-10-05 (×7): 3 mL via INTRAVENOUS

## 2022-10-02 MED ORDER — BUTALBITAL-APAP-CAFFEINE 50-325-40 MG PO TABS: 1.0000 | ORAL_TABLET | Freq: Four times a day (QID) | ORAL | Status: DC | PRN

## 2022-10-02 MED ORDER — SODIUM CHLORIDE 0.9 % IV SOLN: 250.0000 mL | INTRAVENOUS | Status: DC | PRN

## 2022-10-02 MED ORDER — ATORVASTATIN CALCIUM 10 MG PO TABS
20.0000 mg | ORAL_TABLET | Freq: Every day | ORAL | Status: DC
  Administered 2022-10-03 – 2022-10-05 (×3): 20 mg via ORAL
  Filled 2022-10-02 (×3): qty 2

## 2022-10-02 MED ORDER — FOLIC ACID 1 MG PO TABS
1.0000 mg | ORAL_TABLET | Freq: Every day | ORAL | Status: DC
  Administered 2022-10-03 – 2022-10-05 (×3): 1 mg via ORAL
  Filled 2022-10-02 (×3): qty 1

## 2022-10-02 MED ORDER — HYDRALAZINE HCL 20 MG/ML IJ SOLN: 10.0000 mg | INTRAMUSCULAR | Status: DC | PRN

## 2022-10-02 MED ORDER — ACETAMINOPHEN 650 MG RE SUPP: 650.0000 mg | Freq: Four times a day (QID) | RECTAL | Status: DC | PRN

## 2022-10-02 MED ORDER — SODIUM CHLORIDE 0.9% FLUSH
3.0000 mL | INTRAVENOUS | Status: DC | PRN
  Administered 2022-10-02: 3 mL via INTRAVENOUS

## 2022-10-02 MED ORDER — ONDANSETRON HCL 4 MG PO TABS: 4.0000 mg | ORAL_TABLET | Freq: Four times a day (QID) | ORAL | Status: DC | PRN

## 2022-10-02 MED ORDER — LACTATED RINGERS IV SOLN: INTRAVENOUS | Status: DC

## 2022-10-02 MED ORDER — ONDANSETRON HCL 4 MG/2ML IJ SOLN: 4.0000 mg | Freq: Four times a day (QID) | INTRAMUSCULAR | Status: DC | PRN

## 2022-10-02 MED ORDER — LACTATED RINGERS IV BOLUS (SEPSIS)
500.0000 mL | Freq: Once | INTRAVENOUS | Status: AC
  Administered 2022-10-02: 500 mL via INTRAVENOUS

## 2022-10-02 MED ORDER — HYDROCODONE-ACETAMINOPHEN 10-325 MG PO TABS
1.0000 | ORAL_TABLET | Freq: Four times a day (QID) | ORAL | Status: DC | PRN
  Administered 2022-10-02 – 2022-10-05 (×11): 1 via ORAL
  Filled 2022-10-02 (×11): qty 1

## 2022-10-02 MED ORDER — CEFAZOLIN SODIUM-DEXTROSE 2-4 GM/100ML-% IV SOLN
2.0000 g | Freq: Three times a day (TID) | INTRAVENOUS | Status: DC
  Administered 2022-10-02 – 2022-10-05 (×10): 2 g via INTRAVENOUS
  Filled 2022-10-02 (×10): qty 100

## 2022-10-02 MED ORDER — CEFAZOLIN SODIUM-DEXTROSE 2-4 GM/100ML-% IV SOLN: 2.0000 g | Freq: Once | INTRAVENOUS | Status: DC

## 2022-10-02 MED ORDER — LACTATED RINGERS IV BOLUS (SEPSIS)
1500.0000 mL | Freq: Once | INTRAVENOUS | Status: AC
  Administered 2022-10-02: 1500 mL via INTRAVENOUS

## 2022-10-02 MED ORDER — ACETAMINOPHEN 325 MG PO TABS
650.0000 mg | ORAL_TABLET | Freq: Four times a day (QID) | ORAL | Status: DC | PRN
  Administered 2022-10-02: 650 mg via ORAL
  Filled 2022-10-02: qty 2

## 2022-10-02 MED ORDER — ENOXAPARIN SODIUM 80 MG/0.8ML IJ SOSY
1.0000 mg/kg | PREFILLED_SYRINGE | Freq: Two times a day (BID) | INTRAMUSCULAR | Status: DC
  Administered 2022-10-02 – 2022-10-04 (×4): 70 mg via SUBCUTANEOUS
  Filled 2022-10-02 (×4): qty 0.8

## 2022-10-02 MED ORDER — GABAPENTIN 400 MG PO CAPS
1200.0000 mg | ORAL_CAPSULE | Freq: Two times a day (BID) | ORAL | Status: DC
  Administered 2022-10-02 – 2022-10-05 (×6): 1200 mg via ORAL
  Filled 2022-10-02 (×6): qty 3

## 2022-10-02 NOTE — ED Notes (Signed)
Pt states he would like something else for pain. Patient also states taking at home Humira 40 mg every other Saturday and treliegy at night and wanted to know if wife could bring medication from home. MD notified and patient updated of discussion.

## 2022-10-02 NOTE — ED Notes (Signed)
ED Provider at bedside. 

## 2022-10-02 NOTE — Consult Note (Addendum)
[facility]    Date of Admission:  10/02/2022     Reason for Consult: mssa bacteremia    Referring Provider: Ladene Artist      Abx: Inpatient none       4/13 +7 days order of cipro/doxy  Assessment: 67 yo male with hep c (treated), htn, PE on eliquis, polycythemia vera, tobacco/alcohol abuse, hx back surgery, pvd s/p bilateral common iliac stent and left external iliac stent, recently seen 09/23/22 in ED for "pain all over" and discharge at the back surgical site, found to have mssa bacteremia but left ama  4/13 bcx 2 of 2 set mssa   4/13 ct superficial surgical site fluid collection, but I am worried about potential deeper infection  I am also worried given duration of bacteremia (cipro/doxy while some coverage is substandard) he might have distant metastatic complication)   I think he would be better evaluated at [place] or [place] (where nsg would be able to see him in person)  Plan: Repeat blood culture before starting abx Start cefazolin 2 gram iv q8hr Tte Would need tee as well Evaluate for any focal pain and would image -- I would get mri lumbar spine Doesn't appear to have any indwelling catheter/cardiac device Recommend transferring to [facility] or [place] as mentioned above to in person ID evaluation and other subspecialty need if so needed (unclear at this time pending further evaluation Discussed with dr Durwin Nora via secure chat    I spent more than 15 minute reviewing data/chart, and coordinating care   ------------------------------------------------ Active Problems:   * No active hospital problems. *    HPI: Micheal Serrano is a 67 y.o. male recalled for mssa bacteremia from 4/13 when he was seen for pain all over and back surgical site discharge   He had ct 4/13 that showed a fluid collection at the surgical site. No obvious om. Nsg evaluated. Patient discharged and bcx returned positive   [facility] has  been trying to reengage patient for admission and he finally showed up today 4/22  Mri was scheduled but not yet done  Afebrile  He was given doxy/cipro 4/13 for concern surgical site infection 4/13  I am unable to determine if he has been taking them  This admission afebrile as well  He is currently at [facility] ed  Family History  Problem Relation Age of Onset   Breast cancer Mother    Thyroid disease Mother    Stroke Mother    Dementia Father    Colon cancer Neg Hx     Social History   Tobacco Use   Smoking status: Former    Packs/day: 1.00    Years: 25.00    Additional pack years: 0.00    Total pack years: 25.00    Types: Cigarettes    Quit date: 09/13/2020    Years since quitting: 2.0   Smokeless tobacco: Never   Tobacco comments:    1 pack a day x  x 30 yrs  Vaping Use   Vaping Use: Never used  Substance Use Topics   Alcohol use: Yes    Alcohol/week: 14.0 standard drinks of alcohol    Types: 14 Cans of beer per week    Comment: 2-3 beers a day   Drug use: No    No Known Allergies  Review of Systems: ROS All Other ROS was negative, except mentioned above   Past  Medical History:  Diagnosis Date   Alcohol abuse    GERD (gastroesophageal reflux disease)    Hepatitis C    states this has been treated and is clear   Hypertension    Multiple subsegmental pulmonary emboli without acute cor pulmonale 09/14/2020   Neck pain    Polycythemia, secondary 05/03/2021       Scheduled Meds: Continuous Infusions:  lactated ringers     lactated ringers     PRN Meds:.   OBJECTIVE: Blood pressure (!) 170/98, pulse 84, temperature 97.7 F (36.5 C), temperature source Oral, resp. rate 14, height  (1.676 m), weight 70 kg, SpO2 96 %.  Physical Exam Chart reviewed   Lab Results Lab Results  Component Value Date   WBC 11.4 (H) 09/23/2022   HGB 14.0 09/23/2022   HCT 40.5 09/23/2022   MCV 91.8 09/23/2022   PLT 205 09/23/2022    Lab Results   Component Value Date   CREATININE 0.61 09/23/2022   BUN 6 (L) 09/23/2022   NA 132 (L) 09/23/2022   K 3.4 (L) 09/23/2022   CL 100 09/23/2022   CO2 22 09/23/2022    Lab Results  Component Value Date   ALT 16 09/23/2022   AST 21 09/23/2022   ALKPHOS 53 09/23/2022   BILITOT 1.2 09/23/2022      Microbiology: Recent Results (from the past 240 hour(s))  Culture, blood (routine x 2)     Status: Abnormal   Collection Time: 09/23/22 10:59 AM   Specimen: Right Antecubital; Blood  Result Value Ref Range Status   Specimen Description RIGHT ANTECUBITAL BLOOD  Final   Special Requests   Final    Blood Culture adequate volume BOTTLES DRAWN AEROBIC AND ANAEROBIC   Culture  Setup Time   Final    GRAM POSITIVE COCCI Gram Stain Report Called to,Read Back By and Verified With:  E.GANTT @ 1518 BY STEPHTR 09/24/22 IN BOTH AEROBIC AND ANAEROBIC BOTTLES CRITICAL VALUE NOTED.  VALUE IS CONSISTENT WITH PREVIOUSLY REPORTED AND CALLED VALUE.    Culture (A)  Final    STAPHYLOCOCCUS AUREUS SUSCEPTIBILITIES PERFORMED ON PREVIOUS CULTURE WITHIN THE LAST 5 DAYS. Performed at [facility], [address] [place], [place] [address]    Report Status 09/26/2022 FINAL  Final  Resp panel by RT-PCR (RSV, Flu A&B, Covid) Anterior Nasal Swab     Status: None   Collection Time: 09/23/22 11:00 AM   Specimen: Anterior Nasal Swab  Result Value Ref Range Status   SARS Coronavirus 2 by RT PCR NEGATIVE NEGATIVE Final    Comment: (NOTE) SARS-CoV-2 target nucleic acids are NOT DETECTED.  The SARS-CoV-2 RNA is generally detectable in upper respiratory specimens during the acute phase of infection. The lowest concentration of SARS-CoV-2 viral copies this assay can detect is 138 copies/mL. A negative result does not preclude SARS-Cov-2 infection and should not be used as the sole basis for treatment or other patient management decisions. A negative result may occur with  improper specimen collection/handling,  submission of specimen other than nasopharyngeal swab, presence of viral mutation(s) within the areas targeted by this assay, and inadequate number of viral copies(<138 copies/mL). A negative result must be combined with clinical observations, patient history, and epidemiological information. The expected result is Negative.  Fact Sheet for Patients:  BloggerCourse.com  Fact Sheet for Healthcare Providers:  SeriousBroker.it  This test is no t yet approved or cleared by the [place] FDA and  has been authorized for detection  and/or diagnosis of SARS-CoV-2 by FDA under an Emergency Use Authorization (EUA). This EUA will remain  in effect (meaning this test can be used) for the duration of the COVID-19 declaration under Section 564(b)(1) of the Act, 21 U.S.C.section 360bbb-3(b)(1), unless the authorization is terminated  or revoked sooner.       Influenza A by PCR NEGATIVE NEGATIVE Final   Influenza B by PCR NEGATIVE NEGATIVE Final    Comment: (NOTE) The Xpert Xpress SARS-CoV-2/FLU/RSV plus assay is intended as an aid in the diagnosis of influenza from Nasopharyngeal swab specimens and should not be used as a sole basis for treatment. Nasal washings and aspirates are unacceptable for Xpert Xpress SARS-CoV-2/FLU/RSV testing.  Fact Sheet for Patients: BloggerCourse.com  Fact Sheet for Healthcare Providers: SeriousBroker.it  This test is not yet approved or cleared by the [place] FDA and has been authorized for detection and/or diagnosis of SARS-CoV-2 by FDA under an Emergency Use Authorization (EUA). This EUA will remain in effect (meaning this test can be used) for the duration of the COVID-19 declaration under Section 564(b)(1) of the Act, 21 U.S.C. section 360bbb-3(b)(1), unless the authorization is terminated or revoked.     Resp Syncytial Virus by PCR NEGATIVE  NEGATIVE Final    Comment: (NOTE) Fact Sheet for Patients: BloggerCourse.com  Fact Sheet for Healthcare Providers: SeriousBroker.it  This test is not yet approved or cleared by the [place] FDA and has been authorized for detection and/or diagnosis of SARS-CoV-2 by FDA under an Emergency Use Authorization (EUA). This EUA will remain in effect (meaning this test can be used) for the duration of the COVID-19 declaration under Section 564(b)(1) of the Act, 21 U.S.C. section 360bbb-3(b)(1), unless the authorization is terminated or revoked.  Performed at [facility], [address] [place], [place] [address]   Culture, blood (routine x 2)     Status: Abnormal   Collection Time: 09/23/22 11:04 AM   Specimen: Left Antecubital; Blood  Result Value Ref Range Status   Specimen Description   Final    LEFT ANTECUBITAL BLOOD Performed at [facility], [address] [place], [place] [address]    Special Requests   Final    Blood Culture results may not be optimal due to an excessive volume of blood received in culture bottles BOTTLES DRAWN AEROBIC AND ANAEROBIC Performed at [facility], [address] [place], [place] [address]    Culture  Setup Time   Final    GRAM POSITIVE COCCI Gram Stain Report Called to,Read Back By and Verified With: E. GANTT @ 1003 BY STEPHTR 09/24/22 BOTTLES DRAWN AEROBIC AND ANAEROBIC CRITICAL RESULT CALLED TO, READ BACK BY AND VERIFIED WITH: RN Polo Riley [id number] AT 1725 BY EC Performed at [facility], [address], [place], [place] [address]    Culture STAPHYLOCOCCUS AUREUS (A)  Final   Report Status 09/26/2022 FINAL  Final   Organism ID, Bacteria STAPHYLOCOCCUS AUREUS  Final      Susceptibility   Staphylococcus aureus - MIC*    CIPROFLOXACIN <=0.5 SENSITIVE Sensitive     ERYTHROMYCIN <=0.25 SENSITIVE Sensitive     GENTAMICIN <=0.5 SENSITIVE Sensitive     OXACILLIN 0.5 SENSITIVE Sensitive      TETRACYCLINE <=1 SENSITIVE Sensitive     VANCOMYCIN 1 SENSITIVE Sensitive     TRIMETH/SULFA <=10 SENSITIVE Sensitive     CLINDAMYCIN <=0.25 SENSITIVE Sensitive     RIFAMPIN <=0.5 SENSITIVE Sensitive     Inducible Clindamycin NEGATIVE Sensitive     *  STAPHYLOCOCCUS AUREUS  Blood Culture ID Panel (Reflexed)     Status: Abnormal   Collection Time: 09/23/22 11:04 AM  Result Value Ref Range Status   Enterococcus faecalis NOT DETECTED NOT DETECTED Final   Enterococcus Faecium NOT DETECTED NOT DETECTED Final   Listeria monocytogenes NOT DETECTED NOT DETECTED Final   Staphylococcus species DETECTED (A) NOT DETECTED Final    Comment: CRITICAL RESULT CALLED TO, READ BACK BY AND VERIFIED WITH: RN Polo Riley [id number] AT 1725 BY EC    Staphylococcus aureus (BCID) DETECTED (A) NOT DETECTED Final    Comment: CRITICAL RESULT CALLED TO, READ BACK BY AND VERIFIED WITH: RN Polo Riley [id number] AT 1725 BY EC    Staphylococcus epidermidis NOT DETECTED NOT DETECTED Final   Staphylococcus lugdunensis NOT DETECTED NOT DETECTED Final   Streptococcus species NOT DETECTED NOT DETECTED Final   Streptococcus agalactiae NOT DETECTED NOT DETECTED Final   Streptococcus pneumoniae NOT DETECTED NOT DETECTED Final   Streptococcus pyogenes NOT DETECTED NOT DETECTED Final   A.calcoaceticus-baumannii NOT DETECTED NOT DETECTED Final   Bacteroides fragilis NOT DETECTED NOT DETECTED Final   Enterobacterales NOT DETECTED NOT DETECTED Final   Enterobacter cloacae complex NOT DETECTED NOT DETECTED Final   Escherichia coli NOT DETECTED NOT DETECTED Final   Klebsiella aerogenes NOT DETECTED NOT DETECTED Final   Klebsiella oxytoca NOT DETECTED NOT DETECTED Final   Klebsiella pneumoniae NOT DETECTED NOT DETECTED Final   Proteus species NOT DETECTED NOT DETECTED Final   Salmonella species NOT DETECTED NOT DETECTED Final   Serratia marcescens NOT DETECTED NOT DETECTED Final   Haemophilus influenzae NOT DETECTED NOT DETECTED Final    Neisseria meningitidis NOT DETECTED NOT DETECTED Final   Pseudomonas aeruginosa NOT DETECTED NOT DETECTED Final   Stenotrophomonas maltophilia NOT DETECTED NOT DETECTED Final   Candida albicans NOT DETECTED NOT DETECTED Final   Candida auris NOT DETECTED NOT DETECTED Final   Candida glabrata NOT DETECTED NOT DETECTED Final   Candida krusei NOT DETECTED NOT DETECTED Final   Candida parapsilosis NOT DETECTED NOT DETECTED Final   Candida tropicalis NOT DETECTED NOT DETECTED Final   Cryptococcus neoformans/gattii NOT DETECTED NOT DETECTED Final   Meth resistant mecA/C and MREJ NOT DETECTED NOT DETECTED Final    Comment: Performed at [facility], [address] [place], [place] [address]     Serology:    Imaging: If present, new imagings (plain films, ct scans, and mri) have been personally visualized and interpreted; radiology reports have been reviewed. Decision making incorporated into the Impression / Recommendations.  4/13 ct lumbar spine 1. Interval fusion and posterior decompression of the L4-S1 levels. 2. Fluid and gas containing collection within the posterior paraspinal soft tissues of the lower lumbar spine measuring approximately 4.3 x 4.3 x 6.0 cm. Exact dimensions are difficult given lack of IV contrast and associated metallic streak artifact from patient's hardware. An infected postoperative fluid collection is the primary concern. Neurosurgical assessment is recommended. 3. Bone loss of the L4 and L5 spinous processes could be postsurgical or related to new erosive change/osteomyelitis. 4. Aortic atherosclerosis    Raymondo Band, MD [facility] [phone] pager    10/02/2022, 12:04 PM

## 2022-10-02 NOTE — Progress Notes (Signed)
ANTICOAGULATION CONSULT NOTE - Initial Consult  Pharmacy Consult for lovenox Indication: pulmonary embolus  No Known Allergies  Patient Measurements: Height:  (167.6 cm) Weight: 70 kg (154 lb 5.2 oz) IBW/kg (Calculated) : 63.8  Vital Signs: Temp: 97.7 F (36.5 C) (04/22 1130) Temp Source: Oral (04/22 1130) BP: 128/106 (04/22 1300) Pulse Rate: 73 (04/22 1300)  Labs: Recent Labs    10/02/22 1218 10/02/22 1229  HGB 15.4 17.0  HCT 46.6 50.0  PLT 425*  --   LABPROT 16.4*  --   INR 1.3*  --   CREATININE 0.69 0.60*    Estimated Creatinine Clearance: 83.1 mL/min (A) (by C-G formula based on SCr of 0.6 mg/dL (L)).   Medical History: Past Medical History:  Diagnosis Date   Alcohol abuse    GERD (gastroesophageal reflux disease)    Hepatitis C    states this has been treated and is clear   Hypertension    Multiple subsegmental pulmonary emboli without acute cor pulmonale 09/14/2020   Neck pain    Polycythemia, secondary 05/03/2021   Assessment: 67 year old male with history of PE on apixaban prior to admit. His last dose was this morning. Orders to transition to lovenox in case procedures are needed. CBC within normal limits.   Goal of Therapy:  Anti-Xa level 0.6-1 units/ml 4hrs after LMWH dose given Monitor platelets by anticoagulation protocol: Yes   Plan:  Lovenox  bid - start tonight CBC every 72 hours for now  Sheppard Coil PharmD., BCPS Clinical Pharmacist 10/02/2022 4:18 PM

## 2022-10-02 NOTE — ED Triage Notes (Signed)
Pt was told to come to hospital due to positive blood cultures  Lab reports pt has staph in his blood cultures

## 2022-10-02 NOTE — H&P (Signed)
History and Physical    Micheal Serrano:[id number] DOB: 30-Jul-1955 DOA: 10/02/2022  PCP: Roe Rutherford, NP   Patient coming from: Home  Chief Complaint: MSSA Bacteremia  HPI: Micheal Serrano is a 67 y.o. male with medical history significant for hypertension, GERD, hep C, polycythemia vera, PE on Eliquis, L5-S1 microdiscectomy on 3/18 at [facility], bilateral common iliac artery stents, and tobacco and alcohol abuse who initially presented to [facility] ED on 4/13 with complaints of "pain everywhere."  He was noted to have some drainage from his surgical site to his low back and at that time after discussion with neurosurgery he was placed on ciprofloxacin with close follow-up outpatient to neurosurgery who recommended continuing the course of treatment as ordered.  He was noted to have positive blood cultures on 4/13 with gram-positive cocci and was called and told to go to an ED with MRI capability to rule out discitis/osteomyelitis.  His culture showed MSSA bacteremia and he had MRI 4/19 with no significant findings.  He finally showed back up to the ED today for evaluation and was seen by ID with recommendations to start cefazolin and transfer to [facility].   ED Course: Vital signs stable and patient afebrile with no leukocytosis noted.  He continues to have some numbness to his legs that persist.  ID consulted and patient has been started on cefazolin with blood cultures obtained.  Chest x-ray with no acute findings.  Review of Systems: Reviewed as noted above, otherwise negative.  Past Medical History:  Diagnosis Date   Alcohol abuse    GERD (gastroesophageal reflux disease)    Hepatitis C    states this has been treated and is clear   Hypertension    Multiple subsegmental pulmonary emboli without acute cor pulmonale 09/14/2020   Neck pain    Polycythemia, secondary 05/03/2021    Past Surgical History:  Procedure Laterality Date   CATARACT EXTRACTION W/PHACO Right 12/26/2019    Procedure: CATARACT EXTRACTION PHACO AND INTRAOCULAR LENS PLACEMENT RIGHT EYE;  Surgeon: Fabio Pierce, MD;  Location: [facility] ORS;  Service: Ophthalmology;  Laterality: Right;  CDE: 11.65   CERVICAL FUSION  06/12/2013   CHOLECYSTECTOMY     COLONOSCOPY N/A 01/30/2013   Procedure: COLONOSCOPY;  Surgeon: Malissa Hippo, MD;  Location: [facility] ENDO SUITE;  Service: Endoscopy;  Laterality: N/A;  100-moved to 12:00 Ann to notify pt   HERNIA REPAIR     KNEE ARTHROSCOPY Right    LEFT L5-S1 SPINE LUMBAR MICRODISCECTOMY  Left    08/2022   LUMBAR LAMINECTOMY/DECOMPRESSION MICRODISCECTOMY Left 06/03/2014   Procedure: LUMBAR FIVE-SACRAL ONE LUMBAR LAMINECTOMY/DECOMPRESSION MICRODISCECTOMY 1 LEVEL;  Surgeon: Hewitt Shorts, MD;  Location: [facility] NEURO ORS;  Service: Neurosurgery;  Laterality: Left;  Left L5S1 laminotomy and microdiskectomy   VASECTOMY       reports that he quit smoking about 2 years ago. His smoking use included cigarettes. He has a 25.00 pack-year smoking history. He has never used smokeless tobacco. He reports current alcohol use of about 14.0 standard drinks of alcohol per week. He reports that he does not use drugs.  No Known Allergies  Family History  Problem Relation Age of Onset   Breast cancer Mother    Thyroid disease Mother    Stroke Mother    Dementia Father    Colon cancer Neg Hx     Prior to Admission medications   Medication Sig Start Date End Date Taking? Authorizing Provider  acetaminophen (TYLENOL) 325 MG tablet  Take 650 mg by mouth every 6 (six) hours as needed for mild pain.    [provider]  apixaban (ELIQUIS) 5 MG TABS tablet Take 1 tablet (5 mg total) by mouth 2 (two) times daily. 09/25/20 12/24/20  Sherryll Burger, Sharley Keeler D, DO  ciprofloxacin (CIPRO) 500 MG tablet Take 1 tablet (500 mg total) by mouth 2 (two) times daily. 09/23/22   Jacalyn Lefevre, MD  Cyanocobalamin (VITAMIN B-12 PO) Take by mouth.    [provider]  doxycycline (VIBRAMYCIN) 100 MG  capsule Take 1 capsule (100 mg total) by mouth 2 (two) times daily. 09/23/22   Jacalyn Lefevre, MD  folic acid (FOLVITE) 1 MG tablet Take 1 mg by mouth daily. 05/16/21   [provider]  HUMIRA PEN 40 MG/0.4ML PNKT Inject 40 mg into the skin every 14 (fourteen) days. 08/16/20   [provider]  olmesartan (BENICAR) 40 MG tablet Take 40 mg by mouth daily.    [provider]  predniSONE (DELTASONE) 50 MG tablet One tablet a day 09/13/21   Elson Areas, PA-C  thiamine 100 MG tablet Take 100 mg by mouth daily. 12/28/20   [provider]    Physical Exam: Vitals:   10/02/22 1130 10/02/22 1130 10/02/22 1200 10/02/22 1300  BP: (!) 170/98  (!) 140/81 (!) 128/106  Pulse: 84  77 73  Resp: Temp:  97.7 F (36.5 C)    TempSrc:  Oral    SpO2: 96%  95% 100%  Weight:      Height:        Constitutional: NAD, calm, comfortable Vitals:   10/02/22 1130 10/02/22 1130 10/02/22 1200 10/02/22 1300  BP: (!) 170/98  (!) 140/81 (!) 128/106  Pulse: 84  77 73  Resp: Temp:  97.7 F (36.5 C)    TempSrc:  Oral    SpO2: 96%  95% 100%  Weight:      Height:       Eyes: lids and conjunctivae normal Neck: normal, supple Respiratory: clear to auscultation bilaterally. Normal respiratory effort. No accessory muscle use.  Cardiovascular: Regular rate and rhythm, no murmurs. Abdomen: no tenderness, no distention. Bowel sounds positive.  Musculoskeletal:  No edema. Skin: no rashes, lesions, ulcers.  Psychiatric: Flat affect  Labs on Admission: I have personally reviewed following labs and imaging studies  CBC: Recent Labs  Lab 10/02/22 1218 10/02/22 1229  WBC 8.0  --   NEUTROABS 4.2  --   HGB 15.4 17.0  HCT 46.6 50.0  MCV 93.0  --   PLT 425*  --    Basic Metabolic Panel: Recent Labs  Lab 10/02/22 1218 10/02/22 1229  NA 136 139  K 3.4* 3.5  CL 101 101  CO2 25  --   GLUCOSE 135* 131*  BUN 6* 4*  CREATININE 0.69 0.60*  CALCIUM 9.4  --     GFR: Estimated Creatinine Clearance: 83.1 mL/min (A) (by C-G formula based on SCr of 0.6 mg/dL (L)). Liver Function Tests: Recent Labs  Lab 10/02/22 1218  AST 18  ALT 17  ALKPHOS 60  BILITOT 0.5  PROT 9.3*  ALBUMIN 3.5   No results for input(s): "LIPASE", "AMYLASE" in the last 168 hours. No results for input(s): "AMMONIA" in the last 168 hours. Coagulation Profile: Recent Labs  Lab 10/02/22 1218  INR 1.3*   Cardiac Enzymes: No results for input(s): "CKTOTAL", "CKMB", "CKMBINDEX", "TROPONINI" in the last 168 hours. BNP (last  3 results) No results for input(s): "PROBNP" in the last 8760 hours. HbA1C: No results for input(s): "HGBA1C" in the last 72 hours. CBG: No results for input(s): "GLUCAP" in the last 168 hours. Lipid Profile: No results for input(s): "CHOL", "HDL", "LDLCALC", "TRIG", "CHOLHDL", "LDLDIRECT" in the last 72 hours. Thyroid Function Tests: No results for input(s): "TSH", "T4TOTAL", "FREET4", "T3FREE", "THYROIDAB" in the last 72 hours. Anemia Panel: No results for input(s): "VITAMINB12", "FOLATE", "FERRITIN", "TIBC", "IRON", "RETICCTPCT" in the last 72 hours. Urine analysis:    Component Value Date/Time   COLORURINE AMBER (A) 10/02/2022 1247   APPEARANCEUR HAZY (A) 10/02/2022 1247   LABSPEC 1.026 10/02/2022 1247   PHURINE 5.0 10/02/2022 1247   GLUCOSEU NEGATIVE 10/02/2022 1247   HGBUR SMALL (A) 10/02/2022 1247   BILIRUBINUR NEGATIVE 10/02/2022 1247   KETONESUR NEGATIVE 10/02/2022 1247   PROTEINUR 30 (A) 10/02/2022 1247   NITRITE NEGATIVE 10/02/2022 1247   LEUKOCYTESUR NEGATIVE 10/02/2022 1247    Radiological Exams on Admission: DG Chest Port 1 View  Result Date: 10/02/2022 CLINICAL DATA:  Sepsis. EXAM: PORTABLE CHEST 1 VIEW COMPARISON:  September 23, 2022. FINDINGS: The heart size and mediastinal contours are within normal limits. Both lungs are clear. The visualized skeletal structures are unremarkable. IMPRESSION: No active disease. Electronically  Signed   By: Lupita Raider M.D.   On: 10/02/2022 12:33    EKG: Independently reviewed. SR 76bpm.  Assessment/Plan Principal Problem:   Bacteremia due to methicillin susceptible Staphylococcus aureus (MSSA) Active Problems:   Tobacco abuse   HTN (hypertension)   GERD (gastroesophageal reflux disease)   Chronic bilateral low back pain without sciatica   Hepatitis C antibody test positive    MSSA bacteremia in the setting of recent L5-S1 microdiscectomy -Continue on Ancef every 8 hours and follow blood cultures -TTE -Transfer to [facility] for formal ID evaluation  History of PE -Hold Eliquis for now and maintain on Lovenox in case operative intervention is needed  Hypertension -Hold home blood pressure agents and monitor for now as he states that he has been somewhat hypotensive recently -IV hydralazine as needed for significant elevations in blood pressure  GERD -PPI  Tobacco abuse -Smokes 10 cigarettes/day, counseled on cessation  Alcohol abuse -Drinks 2 alcoholic drinks every evening with hard liquor, counseled on cessation -Monitor for withdrawal symptoms   DVT prophylaxis: Full dose Lovenox Code Status: Full Family Communication: None at bedside Disposition Plan: Transfer to [facility] for further ID evaluation Consults called: EDP has discussed with ID and neurosurgery Admission status: Inpatient, MedSurg  Severity of Illness: The appropriate patient status for this patient is INPATIENT. Inpatient status is judged to be reasonable and necessary in order to provide the required intensity of service to ensure the patient's safety. The patient's presenting symptoms, physical exam findings, and initial radiographic and laboratory data in the context of their chronic comorbidities is felt to place them at high risk for further clinical deterioration. Furthermore, it is not anticipated that the patient will be medically stable for discharge from the hospital within 2 midnights  of admission.   * I certify that at the point of admission it is my clinical judgment that the patient will require inpatient hospital care spanning beyond 2 midnights from the point of admission due to high intensity of service, high risk for further deterioration and high frequency of surveillance required.*   Melita Villalona D Arias Weinert DO [facility]  If 7PM-7AM, please contact night-coverage [email or web address]  10/02/2022, 2:22 PM

## 2022-10-02 NOTE — ED Provider Notes (Signed)
[place] EMERGENCY DEPARTMENT AT [facility] Provider Note   CSN: [id number] Arrival date & time: 10/02/22  1053     History  Chief Complaint  Patient presents with   abnormal lab value    Micheal Serrano is a 67 y.o. male.  Positive blood cultures    Patient presents for recent positive blood cultures.  Medical history includes HTN, gerd, hep c (treated), PE (on Eliquis), polycythemia vera, hx tobacco and alcohol abuse.  He had back surgery in January and again on 3/18.  He was seen in the ED 9 days ago for diffuse pain and drainage from surgical site.  At the time, this was discussed with neurosurgery who recommended ciprofloxacin.  He was started on this and completed 7 days of it.  Purulent drainage improved.  He had neurosurgery follow-up scheduled on 4/16.  During that visit, his neurosurgeon reviewed CT imaging and did not have concern of osteomyelitis.  He did undergo MRI last week.  He was not informed of the results of this study.  Today he had a routine PCP visit.  PCP reviewed recent medical records and saw that he had positive blood cultures 9 days ago.  She sent him to the ED.  Patient denies any recent systemic symptoms.  He has an ongoing low back pain as well as migrating pain throughout his lower extremities.    Home Medications Prior to Admission medications   Medication Sig Start Date End Date Taking? Authorizing Provider  acetaminophen (TYLENOL) 325 MG tablet Take 650 mg by mouth every 6 (six) hours as needed for mild pain.    [provider]  apixaban (ELIQUIS) 5 MG TABS tablet Take 1 tablet (5 mg total) by mouth 2 (two) times daily. 09/25/20 12/24/20  Sherryll Burger, Pratik D, DO  ciprofloxacin (CIPRO) 500 MG tablet Take 1 tablet (500 mg total) by mouth 2 (two) times daily. 09/23/22   Jacalyn Lefevre, MD  Cyanocobalamin (VITAMIN B-12 PO) Take by mouth.    [provider]  doxycycline (VIBRAMYCIN) 100 MG capsule Take 1 capsule (100 mg total) by  mouth 2 (two) times daily. 09/23/22   Jacalyn Lefevre, MD  folic acid (FOLVITE) 1 MG tablet Take 1 mg by mouth daily. 05/16/21   [provider]  HUMIRA PEN 40 MG/0.4ML PNKT Inject 40 mg into the skin every 14 (fourteen) days. 08/16/20   [provider]  olmesartan (BENICAR) 40 MG tablet Take 40 mg by mouth daily.    [provider]  predniSONE (DELTASONE) 50 MG tablet One tablet a day 09/13/21   Elson Areas, PA-C  thiamine 100 MG tablet Take 100 mg by mouth daily. 12/28/20   [provider]      Allergies    Patient has no known allergies.    Review of Systems   Review of Systems  Musculoskeletal:  Positive for back pain.  Skin:  Positive for wound.  All other systems reviewed and are negative.   Physical Exam Updated Vital Signs BP (!) 128/106   Pulse 73   Temp 97.7 F (36.5 C) (Oral)   Resp 15   Ht 5\' 6"  (1.676 m)   Wt 70 kg   SpO2 100%   BMI 24.91 kg/m  Physical Exam Vitals and nursing note reviewed.  Constitutional:      General: He is not in acute distress.    Appearance: Normal appearance. He is well-developed and normal weight. He is not ill-appearing, toxic-appearing or diaphoretic.  HENT:     Head: Normocephalic and atraumatic.     Right Ear: External ear normal.     Left Ear: External ear normal.     Nose: Nose normal.     Mouth/Throat:     Mouth: Mucous membranes are moist.  Eyes:     Extraocular Movements: Extraocular movements intact.     Conjunctiva/sclera: Conjunctivae normal.  Cardiovascular:     Rate and Rhythm: Normal rate and regular rhythm.  Pulmonary:     Effort: Pulmonary effort is normal. No respiratory distress.  Abdominal:     General: There is no distension.     Palpations: Abdomen is soft.     Tenderness: There is no abdominal tenderness.  Musculoskeletal:        General: No swelling. Normal range of motion.     Cervical back: Normal range of motion and neck supple.     Right lower leg: No edema.      Left lower leg: No edema.  Skin:    General: Skin is warm and dry.     Coloration: Skin is not jaundiced or pale.     Comments: Surgical site to lower back shows small opening at the superior aspect.  There is some serous drainage.  Tenderness is greater on the inferior aspect.  Neurological:     General: No focal deficit present.     Mental Status: He is alert and oriented to person, place, and time.     Cranial Nerves: No cranial nerve deficit.     Sensory: No sensory deficit.     Motor: No weakness.     Coordination: Coordination normal.  Psychiatric:        Mood and Affect: Mood normal.        Behavior: Behavior normal.        Thought Content: Thought content normal.        Judgment: Judgment normal.     ED Results / Procedures / Treatments   Labs (all labs ordered are listed, but only abnormal results are displayed) Labs Reviewed  LACTIC ACID, PLASMA - Abnormal; Notable for the following components:      Result Value   Lactic Acid, Venous 2.0 (*)    All other components within normal limits  COMPREHENSIVE METABOLIC PANEL - Abnormal; Notable for the following components:   Potassium 3.4 (*)    Glucose, Bld 135 (*)    BUN 6 (*)    Total Protein 9.3 (*)    All other components within normal limits  CBC WITH DIFFERENTIAL/PLATELET - Abnormal; Notable for the following components:   Platelets 425 (*)    All other components within normal limits  PROTIME-INR - Abnormal; Notable for the following components:   Prothrombin Time 16.4 (*)    INR 1.3 (*)    All other components within normal limits  I-STAT CHEM 8, ED - Abnormal; Notable for the following components:   BUN 4 (*)    Creatinine, Ser 0.60 (*)    Glucose, Bld 131 (*)    All other components within normal limits  CULTURE, BLOOD (ROUTINE X 2)  CULTURE, BLOOD (ROUTINE X 2)  LACTIC ACID, PLASMA  URINALYSIS, ROUTINE W REFLEX MICROSCOPIC  SEDIMENTATION RATE  C-REACTIVE PROTEIN    EKG None  Radiology DG Chest  Port 1 View  Result Date: 10/02/2022 CLINICAL DATA:  Sepsis. EXAM: PORTABLE CHEST 1 VIEW COMPARISON:  September 23, 2022. FINDINGS: The heart size and mediastinal contours are within normal limits. Both  lungs are clear. The visualized skeletal structures are unremarkable. IMPRESSION: No active disease. Electronically Signed   By: Lupita Raider M.D.   On: 10/02/2022 12:33    Procedures Procedures    Medications Ordered in ED Medications  lactated ringers infusion ( Intravenous New Bag/Given 10/02/22 1217)  ceFAZolin (ANCEF) IVPB 2g/100 mL premix (2 g Intravenous New Bag/Given 10/02/22 1305)  lactated ringers bolus 1,500 mL (has no administration in time range)  lactated ringers bolus 500 mL (500 mLs Intravenous New Bag/Given 10/02/22 1219)    ED Course/ Medical Decision Making/ A&P                             Medical Decision Making Amount and/or Complexity of Data Reviewed Labs: ordered. Radiology: ordered. ECG/medicine tests: ordered.  Risk Prescription drug management. Decision regarding hospitalization.   This patient presents to the ED for concern of positive blood cultures, this involves an extensive number of treatment options, and is a complaint that carries with it a high risk of complications and morbidity.  The differential diagnosis includes bacteremia, contaminated sample   Co morbidities that complicate the patient evaluation  HTN, gerd, hep c (treated), PE (on Eliquis), polycythemia vera, hx tobacco and alcohol abuse   Additional history obtained:  Additional history obtained from patient's wife External records from outside source obtained and reviewed including EMR   Lab Tests:  I Ordered, and personally interpreted labs.  The pertinent results include: High-normal lactate, no leukocytosis, normal hemoglobin, normal kidney function   Imaging Studies ordered:  I ordered imaging studies including chest x-ray I independently visualized and interpreted  imaging which showed no acute findings I agree with the radiologist interpretation   Cardiac Monitoring: / EKG:  The patient was maintained on a cardiac monitor.  I personally viewed and interpreted the cardiac monitored which showed an underlying rhythm of: Sinus rhythm   Consultations Obtained:  I requested consultation with the infectious disease doctor, Dr. Renold Don,  and discussed lab and imaging findings as well as pertinent plan - they recommend:  patient is high risk for morbid outcome. given duration of mssa bacteremia, vascular stent, and potential surgical site deep infection.  Recommend initiation of Ancef to [facility] or [facility] I requested consultation with the neurosurgery team, Leo Grosser,  and discussed lab and imaging findings as well as pertinent plan - they recommend: Medical management only in the absence of epidural abscess.  Further spine evaluation should be directed to his neurosurgeon, Dr. Lorenso Courier.   Problem List / ED Course / Critical interventions / Medication management  Patient presents for positive blood cultures.  These were drawn 9 days ago during his visit in the ED.  Since that time, he has followed up with his neurosurgeon who stated that he does not have concerns of osteomyelitis.  The purulent drainage from his recent surgical site has resolved.  He has ongoing pain that has not worsened.  He has not had systemic symptoms.  He did complete 1 week of ciprofloxacin.  He had a routine visit with his PCP today and was directed to the ED due to concerns of bacteremia.  On arrival in the ED, vital signs are normal.  Patient is well-appearing on exam.  There does appear to be some serous drainage from the surgical site, without obvious purulence.  I spoke with infectious disease doctor, Dr. Renold Don, who recommends initiation of Ancef following repeat blood cultures and admission  to [facility] or [facility].  Ancef was ordered.  Initial lab work is notable for high-normal  lactic acid at 2.0.  Patient declined any pain medication while in the ED. I reached out to radial allergy who was able to input a read from patient's MRI 3 days ago.  Radiology impression is that no findings are present to suggest epidural abscess or osteomyelitis.  Patient was admitted for further management. I ordered medication including IV fluids for hydration; Ancef for bacteremia Reevaluation of the patient after these medicines showed that the patient improved I have reviewed the patients home medicines and have made adjustments as needed   Social Determinants of Health:  Has access to outpatient care         Final Clinical Impression(s) / ED Diagnoses Final diagnoses:  Bacteremia    Rx / DC Orders ED Discharge Orders     None         Gloris Manchester, MD 10/02/22 1425

## 2022-10-02 NOTE — ED Notes (Signed)
ED TO INPATIENT HANDOFF REPORT  ED Nurse Name and Phone #: Deanna  S Name/Age/Gender Micheal Serrano 67 y.o. male Room/Bed: APFT20/APFT20  Code Status   Code Status: Full Code  Home/SNF/Other Home Patient oriented to: self, place, time, and situation Is this baseline? Yes   Triage Complete: Triage complete  Chief Complaint Bacteremia due to methicillin susceptible Staphylococcus aureus (MSSA) [R78.81, B95.61]  Triage Note Pt was told to come to hospital due to positive blood cultures  Lab reports pt has staph in his blood cultures   Allergies No Known Allergies  Level of Care/Admitting Diagnosis ED Disposition     ED Disposition  Admit   Condition  --   Comment  Hospital Area: [facility] [[id number]]  Level of Care: Med-Surg [16]  May admit patient to [facility] or [facility] if equivalent level of care is available:: No  Covid Evaluation: Asymptomatic - no recent exposure (last 10 days) testing not required  Diagnosis: Bacteremia due to methicillin susceptible Staphylococcus aureus (MSSA) [1478295]  Admitting Physician: Erick Blinks [[id number]]  Attending Physician: Maurilio Lovely D [[id number]]  Certification:: I certify this patient will need inpatient services for at least 2 midnights  Estimated Length of Stay: 3          B Medical/Surgery History Past Medical History:  Diagnosis Date   Alcohol abuse    GERD (gastroesophageal reflux disease)    Hepatitis C    states this has been treated and is clear   Hypertension    Multiple subsegmental pulmonary emboli without acute cor pulmonale 09/14/2020   Neck pain    Polycythemia, secondary 05/03/2021   Past Surgical History:  Procedure Laterality Date   CATARACT EXTRACTION W/PHACO Right 12/26/2019   Procedure: CATARACT EXTRACTION PHACO AND INTRAOCULAR LENS PLACEMENT RIGHT EYE;  Surgeon: Fabio Pierce, MD;  Location: [facility];  Service: Ophthalmology;  Laterality: Right;  CDE: 11.65    CERVICAL FUSION  06/12/2013   CHOLECYSTECTOMY     COLONOSCOPY N/A 01/30/2013   Procedure: COLONOSCOPY;  Surgeon: Malissa Hippo, MD;  Location: [facility];  Service: Endoscopy;  Laterality: N/A;  100-moved to 12:00 Ann to notify pt   HERNIA REPAIR     KNEE ARTHROSCOPY Right    LEFT L5-S1 SPINE LUMBAR MICRODISCECTOMY  Left    08/2022   LUMBAR LAMINECTOMY/DECOMPRESSION MICRODISCECTOMY Left 06/03/2014   Procedure: LUMBAR FIVE-SACRAL ONE LUMBAR LAMINECTOMY/DECOMPRESSION MICRODISCECTOMY 1 LEVEL;  Surgeon: Hewitt Shorts, MD;  Location: [facility];  Service: Neurosurgery;  Laterality: Left;  Left L5S1 laminotomy and microdiskectomy   VASECTOMY       A IV Location/Drains/Wounds Patient Lines/Drains/Airways Status     Active Line/Drains/Airways     Name Placement date Placement time Site Days   Peripheral IV 10/02/22 22 G Right Antecubital 10/02/22  1503  Antecubital  less than 1            Intake/Output Last 24 hours  Intake/Output Summary (Last 24 hours) at 10/02/2022 1642 Last data filed at 10/02/2022 1454 Gross per 24 hour  Intake 603 ml  Output 250 ml  Net 353 ml    Labs/Imaging Results for orders placed or performed during the hospital encounter of 10/02/22 (from the past 48 hour(s))  Lactic acid, plasma     Status: Abnormal   Collection Time: 10/02/22 12:18 PM  Result Value Ref Range   Lactic Acid, Venous 2.0 (HH) 0.5 - 1.9 mmol/L    Comment: CRITICAL RESULT CALLED TO, READ  BACK BY AND VERIFIED WITH DEANNA FOWLER @ 1258 ON 10/02/22 C VARNER Performed at [facility], [address] [place], [place] [address]   Comprehensive metabolic panel     Status: Abnormal   Collection Time: 10/02/22 12:18 PM  Result Value Ref Range   Sodium 136 135 - 145 mmol/L   Potassium 3.4 (L) 3.5 - 5.1 mmol/L   Chloride 101 98 - 111 mmol/L   CO2 25 22 - 32 mmol/L   Glucose, Bld 135 (H) 70 - 99 mg/dL    Comment: Glucose reference range applies only to samples taken after fasting for  at least 8 hours.   BUN 6 (L) 8 - 23 mg/dL   Creatinine, Ser 6.04 0.61 - 1.24 mg/dL   Calcium 9.4 8.9 - 54.0 mg/dL   Total Protein 9.3 (H) 6.5 - 8.1 g/dL   Albumin 3.5 3.5 - 5.0 g/dL   AST 18 15 - 41 U/L   ALT 17 0 - 44 U/L   Alkaline Phosphatase 60 38 - 126 U/L   Total Bilirubin 0.5 0.3 - 1.2 mg/dL   GFR, Estimated >98 >11 mL/min    Comment: (NOTE) Calculated using the CKD-EPI Creatinine Equation (2021)    Anion gap 10 5 - 15    Comment: Performed at [facility], [address], [place], [place] [address]  CBC with Differential     Status: Abnormal   Collection Time: 10/02/22 12:18 PM  Result Value Ref Range   WBC 8.0 4.0 - 10.5 K/uL   RBC 5.01 4.22 - 5.81 MIL/uL   Hemoglobin 15.4 13.0 - 17.0 g/dL   HCT 29.5 62.1 - 30.8 %   MCV 93.0 80.0 - 100.0 fL   MCH 30.7 26.0 - 34.0 pg   MCHC 33.0 30.0 - 36.0 g/dL   RDW 65.7 84.6 - 96.2 %   Platelets 425 (H) 150 - 400 K/uL   nRBC 0.0 0.0 - 0.2 %   Neutrophils Relative % 51 %   Neutro Abs 4.2 1.7 - 7.7 K/uL   Lymphocytes Relative 35 %   Lymphs Abs 2.8 0.7 - 4.0 K/uL   Monocytes Relative 9 %   Monocytes Absolute 0.7 0.1 - 1.0 K/uL   Eosinophils Relative 3 %   Eosinophils Absolute 0.3 0.0 - 0.5 K/uL   Basophils Relative 1 %   Basophils Absolute 0.1 0.0 - 0.1 K/uL   Immature Granulocytes 1 %   Abs Immature Granulocytes 0.04 0.00 - 0.07 K/uL    Comment: Performed at [facility], [address] [place], [place] [address]  Protime-INR     Status: Abnormal   Collection Time: 10/02/22 12:18 PM  Result Value Ref Range   Prothrombin Time 16.4 (H) 11.4 - 15.2 seconds   INR 1.3 (H) 0.8 - 1.2    Comment: (NOTE) INR goal varies based on device and disease states. Performed at [facility], [address], [place], [place] [address]   Blood Culture (routine x 2)     Status: None (Preliminary result)   Collection Time: 10/02/22 12:18 PM   Specimen: Right Antecubital; Blood  Result Value Ref Range   Specimen Description      RIGHT  ANTECUBITAL BOTTLES DRAWN AEROBIC AND ANAEROBIC   Special Requests      Blood Culture results may not be optimal due to an excessive volume of blood received in culture bottles Performed at [facility], [address], [place], [place] [address]    Culture PENDING  Report Status PENDING   Sedimentation rate     Status: Abnormal   Collection Time: 10/02/22 12:18 PM  Result Value Ref Range   Sed Rate 97 (H) 0 - 16 mm/hr    Comment: Performed at [facility], [address] [place], [place] [address]  I-stat chem 8, ED     Status: Abnormal   Collection Time: 10/02/22 12:29 PM  Result Value Ref Range   Sodium 139 135 - 145 mmol/L   Potassium 3.5 3.5 - 5.1 mmol/L   Chloride 101 98 - 111 mmol/L   BUN 4 (L) 8 - 23 mg/dL   Creatinine, Ser 7.82 (L) 0.61 - 1.24 mg/dL   Glucose, Bld 956 (H) 70 - 99 mg/dL    Comment: Glucose reference range applies only to samples taken after fasting for at least 8 hours.   Calcium, Ion 1.20 1.15 - 1.40 mmol/L   TCO2 25 22 - 32 mmol/L   Hemoglobin 17.0 13.0 - 17.0 g/dL   HCT 21.3 08.6 - 57.8 %  Blood Culture (routine x 2)     Status: None (Preliminary result)   Collection Time: 10/02/22 12:34 PM   Specimen: BLOOD LEFT ARM  Result Value Ref Range   Specimen Description BLOOD LEFT ARM BOTTLES DRAWN AEROBIC AND ANAEROBIC    Special Requests      Blood Culture adequate volume Performed at [facility], [address], [place], [place] [address]    Culture PENDING    Report Status PENDING   C-reactive protein     Status: Abnormal   Collection Time: 10/02/22 12:34 PM  Result Value Ref Range   CRP 1.9 (H) <1.0 mg/dL    Comment: Performed at [facility], [address], [place], [place] [address]  Urinalysis, Routine w reflex microscopic -Urine, Clean Catch     Status: Abnormal   Collection Time: 10/02/22 12:47 PM  Result Value Ref Range   Color, Urine AMBER (A) YELLOW    Comment: BIOCHEMICALS MAY BE AFFECTED BY COLOR   APPearance HAZY (A) CLEAR    Specific Gravity, Urine 1.026 1.005 - 1.030   pH 5.0 5.0 - 8.0   Glucose, UA NEGATIVE NEGATIVE mg/dL   Hgb urine dipstick SMALL (A) NEGATIVE   Bilirubin Urine NEGATIVE NEGATIVE   Ketones, ur NEGATIVE NEGATIVE mg/dL   Protein, ur 30 (A) NEGATIVE mg/dL   Nitrite NEGATIVE NEGATIVE   Leukocytes,Ua NEGATIVE NEGATIVE   RBC / HPF 21-50 0 - 5 RBC/hpf   WBC, UA 11-20 0 - 5 WBC/hpf   Bacteria, UA RARE (A) NONE SEEN   Squamous Epithelial / HPF 0-5 0 - 5 /HPF   Mucus PRESENT    Hyaline Casts, UA PRESENT    Ca Oxalate Crys, UA PRESENT     Comment: Performed at [facility], [address], [place], [place] [address]  Lactic acid, plasma     Status: None   Collection Time: 10/02/22  2:33 PM  Result Value Ref Range   Lactic Acid, Venous 1.7 0.5 - 1.9 mmol/L    Comment: Performed at [facility], [address] [place], [place] [address]   DG Chest Port 1 View  Result Date: 10/02/2022 CLINICAL DATA:  Sepsis. EXAM: PORTABLE CHEST 1 VIEW COMPARISON:  September 23, 2022. FINDINGS: The heart size and mediastinal contours are within normal limits. Both lungs are clear. The visualized skeletal structures are unremarkable. IMPRESSION: No active disease. Electronically Signed   By: Zenda Alpers.D.  On: 10/02/2022 12:33    Pending Labs Unresulted Labs (From admission, onward)     Start     Ordered   10/03/22 0500  Magnesium  Tomorrow morning,   R        10/02/22 1425   10/03/22 0500  Basic metabolic panel  Tomorrow morning,   R        10/02/22 1425   10/03/22 0500  CBC  Tomorrow morning,   R        10/02/22 1425   10/02/22 1424  HIV Antibody (routine testing w rflx)  (HIV Antibody (Routine testing w reflex) panel)  Once,   R        10/02/22 1425            Vitals/Pain Today's Vitals   10/02/22 1600 10/02/22 1619 10/02/22 1629 10/02/22 1630  BP: 130/72   (!) 141/80  Pulse: 75   71  Resp: 10   10  Temp:  97.6 F (36.4 C)    TempSrc:  Oral    SpO2: 96%   95%  Weight:      Height:       PainSc:   5      Isolation Precautions No active isolations  Medications Medications  ceFAZolin (ANCEF) IVPB 2g/100 mL premix (0 g Intravenous Stopped 10/02/22 1346)  HYDROcodone-acetaminophen (NORCO) 10-325 MG per tablet 1 tablet (1 tablet Oral Given 10/02/22 1452)  atorvastatin (LIPITOR) tablet 20 mg (has no administration in time range)  folic acid (FOLVITE) tablet 1 mg (has no administration in time range)  gabapentin (NEURONTIN) capsule 1,200 mg (has no administration in time range)  sodium chloride flush (NS) 0.9 % injection 3 mL (3 mLs Intravenous Given 10/02/22 1453)  sodium chloride flush (NS) 0.9 % injection 3 mL (3 mLs Intravenous Given 10/02/22 1453)  0.9 %  sodium chloride infusion (has no administration in time range)  acetaminophen (TYLENOL) tablet 650 mg (has no administration in time range)    Or  acetaminophen (TYLENOL) suppository 650 mg (has no administration in time range)  ondansetron (ZOFRAN) tablet 4 mg (has no administration in time range)    Or  ondansetron (ZOFRAN) injection 4 mg (has no administration in time range)  hydrALAZINE (APRESOLINE) injection 10 mg (has no administration in time range)  enoxaparin (LOVENOX) injection 70 mg (has no administration in time range)  lactated ringers bolus 500 mL (0 mLs Intravenous Stopped 10/02/22 1347)  lactated ringers bolus 1,500 mL (0 mLs Intravenous Stopped 10/02/22 1619)    Mobility walks     Focused Assessments Neuro Assessment Handoff:  Swallow screen pass?  N/A         Neuro Assessment:   Neuro Checks:      Has TPA been given? No If patient is a Neuro Trauma and patient is going to OR before floor call report to 4N Charge nurse: [phone] or [phone]   R Recommendations: See Admitting Provider Note  Report given to:   Additional Notes:

## 2022-10-03 ENCOUNTER — Inpatient Hospital Stay (HOSPITAL_COMMUNITY): Payer: BC Managed Care – PPO

## 2022-10-03 DIAGNOSIS — B9561 Methicillin susceptible Staphylococcus aureus infection as the cause of diseases classified elsewhere: Secondary | ICD-10-CM | POA: Diagnosis not present

## 2022-10-03 DIAGNOSIS — R7881 Bacteremia: Secondary | ICD-10-CM | POA: Diagnosis not present

## 2022-10-03 LAB — BASIC METABOLIC PANEL
Anion gap: 9 (ref 5–15)
BUN: <5 mg/dL — ABNORMAL LOW (ref 8–23)
CO2: 23 mmol/L (ref 22–32)
Calcium: 8.6 mg/dL — ABNORMAL LOW (ref 8.9–10.3)
Chloride: 104 mmol/L (ref 98–111)
Creatinine, Ser: 0.63 mg/dL (ref 0.61–1.24)
GFR, Estimated: >60 mL/min (ref >60)
Glucose, Bld: 94 mg/dL (ref 70–99)
Potassium: 3.3 mmol/L — ABNORMAL LOW (ref 3.5–5.1)
Sodium: 136 mmol/L (ref 135–145)

## 2022-10-03 LAB — ECHOCARDIOGRAM COMPLETE
AR max vel: 2.54 cm2
AV Peak grad: 3.5 mmHg
Ao pk vel: 0.93 m/s
Area-P 1/2: 3.91 cm2
Height: 66 in
S' Lateral: 3.4 cm
Weight: 2469.15 oz

## 2022-10-03 LAB — CBC
HCT: 35.7 % — ABNORMAL LOW (ref 39.0–52.0)
Hemoglobin: 11.8 g/dL — ABNORMAL LOW (ref 13.0–17.0)
MCH: 30.8 pg (ref 26.0–34.0)
MCHC: 33.1 g/dL (ref 30.0–36.0)
MCV: 93.2 fL (ref 80.0–100.0)
Platelets: 348 10*3/uL (ref 150–400)
RBC: 3.83 MIL/uL — ABNORMAL LOW (ref 4.22–5.81)
RDW: 12.6 % (ref 11.5–15.5)
WBC: 8.6 10*3/uL (ref 4.0–10.5)
nRBC: 0 % (ref 0.0–0.2)

## 2022-10-03 LAB — MAGNESIUM: Magnesium: 1.6 mg/dL — ABNORMAL LOW (ref 1.7–2.4)

## 2022-10-03 LAB — CULTURE, BLOOD (ROUTINE X 2): Special Requests: ADEQUATE

## 2022-10-03 MED ORDER — SENNOSIDES-DOCUSATE SODIUM 8.6-50 MG PO TABS
1.0000 | ORAL_TABLET | Freq: Every day | ORAL | Status: DC
  Administered 2022-10-03 – 2022-10-04 (×2): 1 via ORAL
  Filled 2022-10-03 (×2): qty 1

## 2022-10-03 MED ORDER — TRAZODONE HCL 50 MG PO TABS: 50.0000 mg | ORAL_TABLET | Freq: Every evening | ORAL | Status: DC | PRN

## 2022-10-03 MED ORDER — POTASSIUM CHLORIDE CRYS ER 20 MEQ PO TBCR
20.0000 meq | EXTENDED_RELEASE_TABLET | Freq: Two times a day (BID) | ORAL | Status: AC
  Administered 2022-10-03 – 2022-10-04 (×4): 20 meq via ORAL
  Filled 2022-10-03 (×4): qty 1

## 2022-10-03 NOTE — Plan of Care (Signed)
Patient AOX4, VSS throughout shift. All meds given on time as ordered. Pt c/o hip and back pain relieved by PRN norco and warm blankets. Diminished lungs, IS encouraged. Pt voided in urinal. Pt ambulated with steady gait. POC maintained, will continue to monitor.   Problem: Education: Goal: Knowledge of General Education information will improve Description: Including pain rating scale, medication(s)/side effects and non-pharmacologic comfort measures Outcome: Progressing   Problem: Health Behavior/Discharge Planning: Goal: Ability to manage health-related needs will improve Outcome: Progressing   Problem: Clinical Measurements: Goal: Ability to maintain clinical measurements within normal limits will improve Outcome: Progressing Goal: Will remain free from infection Outcome: Progressing Goal: Diagnostic test results will improve Outcome: Progressing Goal: Respiratory complications will improve Outcome: Progressing Goal: Cardiovascular complication will be avoided Outcome: Progressing   Problem: Activity: Goal: Risk for activity intolerance will decrease Outcome: Progressing   Problem: Nutrition: Goal: Adequate nutrition will be maintained Outcome: Progressing   Problem: Coping: Goal: Level of anxiety will decrease Outcome: Progressing   Problem: Elimination: Goal: Will not experience complications related to bowel motility Outcome: Progressing Goal: Will not experience complications related to urinary retention Outcome: Progressing   Problem: Pain Managment: Goal: General experience of comfort will improve Outcome: Progressing   Problem: Safety: Goal: Ability to remain free from injury will improve Outcome: Progressing   Problem: Skin Integrity: Goal: Risk for impaired skin integrity will decrease Outcome: Progressing   

## 2022-10-03 NOTE — Progress Notes (Signed)
PROGRESS NOTE    Micheal Serrano  NUU:[id number] DOB: March 19, 1956 DOA: 10/02/2022 PCP: Roe Rutherford, NP    Brief Narrative:  67 year old with history of hypertension, GERD, hep C, polycythemia vera, PE on Eliquis, recent L5-S1 microdiscectomy on 3/18 at [facility], smoker presented to [facility], ER on 4/13 with pain everywhere.  He was also noted to have drainage from his surgical site of the low back.  Was placed on ciprofloxacin and recommended follow-up with neurosurgery.  He was found to have positive blood cultures and called back to ER.  Blood culture showed MSSA bacteremia, MRI on 4/19 with no significant findings.  Seen at [facility], ER, transferred to [facility] for ID consultation.   Assessment & Plan:   MSSA bacteremia due to recent spinal surgery: Hemodynamically stable. Currently on Ancef. TTE pending. Blood cultures 4/13 MSSA Blood cultures 4/22, pending ID following. MRI lumbar spine 4/19 with postoperative changes, no epidural abscess or osteomyelitis.  He was seen by his neurosurgery in the office and recommended oral antibiotics. Pain managed with oral pain medications.  Continue mobility.  History of pulmonary embolism: Holding Eliquis.  On Lovenox.  Unlikely needing any surgery.  GERD: PPI  Smoker: Smokes 10 cigarettes/day.  Counseled on cessation.  Nicotine patch.  Alcoholism with alcohol abuse: Counseled on cessation.  Monitor for withdrawal symptoms.     DVT prophylaxis: Lovenox   Code Status: Full code Family Communication: Wife at the bedside. Disposition Plan: Status is: Inpatient Remains inpatient appropriate because: Treatment for bacteremia     Consultants:  Infectious disease  Procedures:  None  Antimicrobials:  Ancef 4/22---   Subjective: Patient seen and examined.  Complained of poor sleep at night.  Patient was seen with ID team.  Extensive discussion about the treatment plan and possible home with IV antibiotics.   Patient complains of low back pain which has been worse since last 2 Gillen not a stable.  Afebrile overnight. Wants some medicine to help sleep.  Objective: Vitals:   10/02/22 1805 10/02/22 2157 10/03/22 0348 10/03/22 0750  BP: 139/73 (!) 145/85 120/60 129/68  Pulse: 71 72 74 72  Resp: Temp:  98 F (36.7 C) 97.9 F (36.6 C) 98 F (36.7 C)  TempSrc:  Oral Oral Oral  SpO2: 98% 98% 96% 97%  Weight:      Height:        Intake/Output Summary (Last 24 hours) at 10/03/2022 1136 Last data filed at 10/03/2022 0751 Gross per 24 hour  Intake 603 ml  Output 1280 ml  Net -677 ml   Filed Weights   10/02/22 1127  Weight: 70 kg    Examination:  General exam: Appears calm and comfortable at rest.  Pleasant to conversation. Respiratory system: No added sounds. Cardiovascular system: S1 & S2 heard, RRR. No pedal edema. Gastrointestinal system: Abdomen is nondistended, soft and nontender. No organomegaly or masses felt. Normal bowel sounds heard. Central nervous system: Alert and oriented. No focal neurological deficits. Extremities: Symmetric 5 x 5 power. Skin:  Lower spinal incision site clean and dry, has a small minimally gaping wound that looks dry.  No fluctuation or erythema.    Data Reviewed: I have personally reviewed following labs and imaging studies  CBC: Recent Labs  Lab 10/02/22 1218 10/02/22 1229 10/03/22 0452  WBC 8.0  --  8.6  NEUTROABS 4.2  --   --   HGB 15.4 17.0 11.8*  HCT 46.6 50.0 35.7*  MCV 93.0  --  93.2  PLT 425*  --  348   Basic Metabolic Panel: Recent Labs  Lab 10/02/22 1218 10/02/22 1229 10/03/22 0452  NA 136 139 136  K 3.4* 3.5 3.3*  CL 101 101 104  CO2 25  --  23  GLUCOSE 135* 131* 94  BUN 6* 4* <5*  CREATININE 0.69 0.60* 0.63  CALCIUM 9.4  --  8.6*  MG  --   --  1.6*   GFR: Estimated Creatinine Clearance: 83.1 mL/min (by C-G formula based on SCr of 0.63 mg/dL). Liver Function Tests: Recent Labs  Lab 10/02/22 1218   AST 18  ALT 17  ALKPHOS 60  BILITOT 0.5  PROT 9.3*  ALBUMIN 3.5   No results for input(s): "LIPASE", "AMYLASE" in the last 168 hours. No results for input(s): "AMMONIA" in the last 168 hours. Coagulation Profile: Recent Labs  Lab 10/02/22 1218  INR 1.3*   Cardiac Enzymes: No results for input(s): "CKTOTAL", "CKMB", "CKMBINDEX", "TROPONINI" in the last 168 hours. BNP (last 3 results) No results for input(s): "PROBNP" in the last 8760 hours. HbA1C: No results for input(s): "HGBA1C" in the last 72 hours. CBG: No results for input(s): "GLUCAP" in the last 168 hours. Lipid Profile: No results for input(s): "CHOL", "HDL", "LDLCALC", "TRIG", "CHOLHDL", "LDLDIRECT" in the last 72 hours. Thyroid Function Tests: No results for input(s): "TSH", "T4TOTAL", "FREET4", "T3FREE", "THYROIDAB" in the last 72 hours. Anemia Panel: No results for input(s): "VITAMINB12", "FOLATE", "FERRITIN", "TIBC", "IRON", "RETICCTPCT" in the last 72 hours. Sepsis Labs: Recent Labs  Lab 10/02/22 1218 10/02/22 1433  LATICACIDVEN 2.0* 1.7    Recent Results (from the past 240 hour(s))  Blood Culture (routine x 2)     Status: None (Preliminary result)   Collection Time: 10/02/22 12:18 PM   Specimen: Right Antecubital; Blood  Result Value Ref Range Status   Specimen Description   Final    RIGHT ANTECUBITAL BOTTLES DRAWN AEROBIC AND ANAEROBIC   Special Requests   Final    Blood Culture results may not be optimal due to an excessive volume of blood received in culture bottles   Culture   Final    NO GROWTH < 24 HOURS Performed at [facility], [address], [place], [place] [address]    Report Status PENDING  Incomplete  Blood Culture (routine x 2)     Status: None (Preliminary result)   Collection Time: 10/02/22 12:34 PM   Specimen: BLOOD LEFT ARM  Result Value Ref Range Status   Specimen Description BLOOD LEFT ARM BOTTLES DRAWN AEROBIC AND ANAEROBIC  Final   Special Requests Blood Culture adequate  volume  Final   Culture   Final    NO GROWTH < 24 HOURS Performed at [facility], [address], [place], [place] [address]    Report Status PENDING  Incomplete         Radiology Studies: DG Chest Port 1 View  Result Date: 10/02/2022 CLINICAL DATA:  Sepsis. EXAM: PORTABLE CHEST 1 VIEW COMPARISON:  September 23, 2022. FINDINGS: The heart size and mediastinal contours are within normal limits. Both lungs are clear. The visualized skeletal structures are unremarkable. IMPRESSION: No active disease. Electronically Signed   By: Lupita Raider M.D.   On: 10/02/2022 12:33        Scheduled Meds:  atorvastatin  20 mg Oral Daily   enoxaparin (LOVENOX) injection  1 mg/kg Subcutaneous Q12H   folic acid  1 mg Oral Daily   gabapentin  1,200 mg Oral  BID   potassium chloride  20 mEq Oral BID   senna-docusate  1 tablet Oral QHS   sodium chloride flush  3 mL Intravenous Q12H   Continuous Infusions:  sodium chloride      ceFAZolin (ANCEF) IV 2 g (10/03/22 1610)     LOS: 1 day    Time spent: 35 minutes    Dorcas Carrow, MD [facility] Pager [phone]

## 2022-10-03 NOTE — Progress Notes (Signed)
Echocardiogram 2D Echocardiogram has been performed.  Micheal Serrano 10/03/2022, 11:49 AM

## 2022-10-03 NOTE — Consult Note (Addendum)
[facility]    Date of Admission:  10/02/2022     Total days of antibiotics 2  Cefazolin 4/22 >> C               Reason for Consult: MSSA bacteremia history     Referring Provider: Auto consultation  Primary Care Provider: Roe Rutherford, NP    Assessment: Micheal Serrano is a 67 y.o. male admitted to the hospital after having MSSA grow out in blood cultures collected during an ER evaluation for worsening back pain in the setting of draining surgical spine incision. He took a course of doxycycline + ciprofloxacin outpatient over the last week and since stopping has felt worse. Now transferred to [facility] for [facility] evaluation given partially/sub-optimally treated MSSA bacteremia in the ambulatory setting.   MSSA Bacteremia - His preliminary blood cultures from this admission are no growth 24h from 4/22 draw. Continue cefazolin. Suspect source 2/2 surgical site infection. Will work up further with TTE - unless significant structural abnormalities or large vegetation seen, we may be able to defer TEE given he has a long indication with vertebral infection already. Follow pending micro.   L Spine infection - suspect this is the source given purulent drainage from spine incision described and increased/new back pain he was experiencing. Suspect he will need 6-8 R[place]igan of IV antibiotics and conversion to longer term PO for suppression given HW in place. MRI shows improvement in fluid collection from CT 1 week prior (in the setting of oral antibiotic use).   Medication Monitoring - follow creatinines for any dose adjustments of abx. WBC normalized. CRP 1.9 / ESR 97 baseline  Rheumatoid Arthritis - continue to hold humira with severe infection.     Plan: Continue cefazolin  Follow pending micro form 4/22 to maturity  TTE impression pending  Hold on PICC Line for now - will need after we have more time to prove bcx sterile Hold  Humira      Principal Problem:   Bacteremia due to methicillin susceptible Staphylococcus aureus (MSSA) Active Problems:   HTN (hypertension)   Tobacco abuse   GERD (gastroesophageal reflux disease)   Chronic bilateral low back pain without sciatica   Hepatitis C antibody test positive    atorvastatin  20 mg Oral Daily   enoxaparin (LOVENOX) injection  1 mg/kg Subcutaneous Q12H   folic acid  1 mg Oral Daily   gabapentin  1,200 mg Oral BID   sodium chloride flush  3 mL Intravenous Q12H    HPI: Micheal Serrano is a 67 y.o. male transferred from [facility] for evaluation of MSSA bacteremia diagnosed back on 4/13 during ER visit for acute back pain and new purulent drainage. He has history of recent L5-S1 decompression March 18 of this year. He did well up until a few [place] ago when the spine incision started bleeding. A few days later it began to drain purulent material. He noticed increasing back pain over this time that is completely new. No fevers of this time frame.  He went to be evaluated in ER on 4/13 which included blood cultures. These returned positive for growth. CT scan at this check also showed fluid collection that had concern for infection - he was started on and completed a course of cipro + doxy Sunday.   He usually takes Humria injections Q2 Schwegler for RA management - they skipped the Saturday dose. He has been  on this since late 2021 and managed by Dr. Reola Calkins.   History of hepatitis c s/p treatment. Last HCV RNA > 10 yrs ago not detected, previously    Review of Systems: Review of Systems  Constitutional:  Positive for diaphoresis. Negative for chills, fever and weight loss.  Respiratory:  Negative for cough and shortness of breath.   Genitourinary:  Negative for dysuria.  Musculoskeletal:  Positive for back pain.  Neurological:  Positive for focal weakness (left foot numbness). Negative for dizziness and headaches.  Psychiatric/Behavioral:  Negative for  depression. The patient is not nervous/anxious.      Past Medical History:  Diagnosis Date   Alcohol abuse    GERD (gastroesophageal reflux disease)    Hepatitis C    states this has been treated and is clear   Hypertension    Multiple subsegmental pulmonary emboli without acute cor pulmonale 09/14/2020   Neck pain    Polycythemia, secondary 05/03/2021     Social History   Tobacco Use   Smoking status: Former    Packs/day: 1.00    Years: 25.00    Additional pack years: 0.00    Total pack years: 25.00    Types: Cigarettes    Quit date: 09/13/2020    Years since quitting: 2.0   Smokeless tobacco: Never   Tobacco comments:    1 pack a day x  x 30 yrs  Vaping Use   Vaping Use: Never used  Substance Use Topics   Alcohol use: Yes    Alcohol/week: 14.0 standard drinks of alcohol    Types: 14 Cans of beer per week    Comment: 2-3 beers a day   Drug use: No    Family History  Problem Relation Age of Onset   Breast cancer Mother    Thyroid disease Mother    Stroke Mother    Dementia Father    Colon cancer Neg Hx    No Known Allergies  OBJECTIVE: Blood pressure 129/68, pulse 72, temperature 98 F (36.7 C), temperature source Oral, resp. rate 18, height  (1.676 m), weight 70 kg, SpO2 97 %.  Physical Exam Vitals and nursing note reviewed.  Constitutional:      Appearance: Normal appearance. He is not ill-appearing.  HENT:     Mouth/Throat:     Mouth: Mucous membranes are moist.  Eyes:     Pupils: Pupils are equal, round, and reactive to light.  Cardiovascular:     Rate and Rhythm: Normal rate and regular rhythm.  Pulmonary:     Effort: Pulmonary effort is normal. No respiratory distress.     Breath sounds: Normal breath sounds.  Abdominal:     General: Bowel sounds are normal. There is no distension.     Palpations: Abdomen is soft.  Musculoskeletal:        General: No swelling or tenderness.  Skin:    General: Skin is warm and dry.     Capillary  Refill: Capillary refill takes less than 2 seconds.     Comments: Lumbar spine incision clean and dry at present. No surrounding edema/erythema.   Neurological:     Mental Status: He is alert and oriented to person, place, and time.     Lab Results Lab Results  Component Value Date   WBC 8.6 10/03/2022   HGB 11.8 (L) 10/03/2022   HCT 35.7 (L) 10/03/2022   MCV 93.2 10/03/2022   PLT 348 10/03/2022    Lab Results  Component Value  Date   CREATININE 0.63 10/03/2022   BUN <5 (L) 10/03/2022   NA 136 10/03/2022   K 3.3 (L) 10/03/2022   CL 104 10/03/2022   CO2 23 10/03/2022    Lab Results  Component Value Date   ALT 17 10/02/2022   AST 18 10/02/2022   ALKPHOS 60 10/02/2022   BILITOT 0.5 10/02/2022     Microbiology: Recent Results (from the past 240 hour(s))  Blood Culture (routine x 2)     Status: None (Preliminary result)   Collection Time: 10/02/22 12:18 PM   Specimen: Right Antecubital; Blood  Result Value Ref Range Status   Specimen Description   Final    RIGHT ANTECUBITAL BOTTLES DRAWN AEROBIC AND ANAEROBIC   Special Requests   Final    Blood Culture results may not be optimal due to an excessive volume of blood received in culture bottles   Culture   Final    NO GROWTH < 24 HOURS Performed at [facility], [address], [place], [place] [address]    Report Status PENDING  Incomplete  Blood Culture (routine x 2)     Status: None (Preliminary result)   Collection Time: 10/02/22 12:34 PM   Specimen: BLOOD LEFT ARM  Result Value Ref Range Status   Specimen Description BLOOD LEFT ARM BOTTLES DRAWN AEROBIC AND ANAEROBIC  Final   Special Requests Blood Culture adequate volume  Final   Culture   Final    NO GROWTH < 24 HOURS Performed at [facility], [address] [place], [place] [address]    Report Status PENDING  Incomplete    Rexene Alberts, MSN, NP-C [facility]  [email or web address] Pager:  [phone] Office: [phone] [facility] Main Line: [phone] *Secure Chat Communication Welcome

## 2022-10-04 DIAGNOSIS — R7881 Bacteremia: Secondary | ICD-10-CM | POA: Diagnosis not present

## 2022-10-04 DIAGNOSIS — Z452 Encounter for adjustment and management of vascular access device: Secondary | ICD-10-CM

## 2022-10-04 DIAGNOSIS — Z8619 Personal history of other infectious and parasitic diseases: Secondary | ICD-10-CM

## 2022-10-04 DIAGNOSIS — T8149XA Infection following a procedure, other surgical site, initial encounter: Secondary | ICD-10-CM | POA: Diagnosis not present

## 2022-10-04 DIAGNOSIS — B9561 Methicillin susceptible Staphylococcus aureus infection as the cause of diseases classified elsewhere: Secondary | ICD-10-CM | POA: Diagnosis not present

## 2022-10-04 LAB — CULTURE, BLOOD (ROUTINE X 2)

## 2022-10-04 MED ORDER — APIXABAN 5 MG PO TABS
5.0000 mg | ORAL_TABLET | Freq: Two times a day (BID) | ORAL | Status: DC
  Administered 2022-10-04 – 2022-10-05 (×2): 5 mg via ORAL
  Filled 2022-10-04 (×2): qty 1

## 2022-10-04 MED ORDER — MAGNESIUM OXIDE -MG SUPPLEMENT 400 (240 MG) MG PO TABS
400.0000 mg | ORAL_TABLET | Freq: Two times a day (BID) | ORAL | Status: DC
  Administered 2022-10-04 – 2022-10-05 (×3): 400 mg via ORAL
  Filled 2022-10-04 (×3): qty 1

## 2022-10-04 NOTE — Progress Notes (Signed)
PROGRESS NOTE    Micheal Serrano  ZOX:[id number] DOB: 12/14/1955 DOA: 10/02/2022 PCP: Roe Rutherford, NP    Brief Narrative:  67 year old with history of hypertension, GERD, hep C, polycythemia vera, PE on Eliquis, recent L5-S1 microdiscectomy on 3/18 at [facility] health, smoker presented to [facility], ER on 4/13 with pain everywhere.  He was also noted to have drainage from his surgical site of the low back.  Was placed on ciprofloxacin and recommended follow-up with neurosurgery.  He was found to have positive blood cultures and called back to ER.  Blood culture showed MSSA bacteremia, MRI on 4/19 with no significant findings.  Seen at [facility], ER, transferred to [facility] for ID consultation.   Assessment & Plan:   MSSA bacteremia due to recent spinal surgery: Hemodynamically stable. Currently on Ancef. TTE with no evidence of vegetation. Blood cultures 4/13 MSSA Blood cultures 4/22, pending.  Negative so far. ID following. MRI lumbar spine 4/19 with postoperative changes, no epidural abscess or osteomyelitis.  He was seen by his neurosurgery in the office and recommended oral antibiotics. Pain managed with oral pain medications.  Continue mobility. Anticipate to home with 6 [place] of Ancef when cleared for PICC line.  History of pulmonary embolism: Do not anticipate procedure.  Go back on Eliquis.  GERD: PPI  Smoker: Smokes 10 cigarettes/day.  Counseled on cessation.  Nicotine patch.  Alcoholism with alcohol abuse: Counseled on cessation.  Monitor for withdrawal symptoms.  Stable so far.  Hepatitis C: Treated.  Not active.  Rheumatoid arthritis: On Humira that is on hold.  Continue to hold until follow-up.  Hypomagnesemia: Replaced.     DVT prophylaxis:  apixaban (ELIQUIS) tablet 5 mg   Code Status: Full code Family Communication: Wife at the bedside. Disposition Plan: Status is: Inpatient Remains inpatient appropriate because: Treatment for bacteremia      Consultants:  Infectious disease  Procedures:  None  Antimicrobials:  Ancef 4/22---   Subjective:  Seen and examined.  No complaints today other than some back pain and hip pain.  Was worried about restricted diet and not able to eat as much.  Changed to regular diet.  Objective: Vitals:   10/03/22 1624 10/03/22 1947 10/04/22 0607 10/04/22 0722  BP: 129/81 125/70 (!) 150/90 (!) 140/73  Pulse: 76 78 75 67  Resp: Temp: 97.8 F (36.6 C) 97.7 F (36.5 C) 98.1 F (36.7 C) 98 F (36.7 C)  TempSrc: Oral Oral Oral   SpO2: 94% 95% 91% 93%  Weight:      Height:        Intake/Output Summary (Last 24 hours) at 10/04/2022 1248 Last data filed at 10/04/2022 0521 Gross per 24 hour  Intake 800 ml  Output 700 ml  Net 100 ml   Filed Weights   10/02/22 1127  Weight: 70 kg    Examination:  General exam: Looks comfortable. Respiratory system: No added sounds. Cardiovascular system: S1 & S2 heard, RRR. No pedal edema. Gastrointestinal system: Abdomen is nondistended, soft and nontender. No organomegaly or masses felt. Normal bowel sounds heard. Central nervous system: Alert and oriented. No focal neurological deficits. Extremities: Symmetric 5 x 5 power. Skin:  Lower spinal incision site clean and dry, has a small minimally gaping wound that looks dry.  No fluctuation or erythema.    Data Reviewed: I have personally reviewed following labs and imaging studies  CBC: Recent Labs  Lab 10/02/22 1218 10/02/22 1229 10/03/22 0452  WBC 8.0  --  8.6  NEUTROABS 4.2  --   --   HGB 15.4 17.0 11.8*  HCT 46.6 50.0 35.7*  MCV 93.0  --  93.2  PLT 425*  --  348   Basic Metabolic Panel: Recent Labs  Lab 10/02/22 1218 10/02/22 1229 10/03/22 0452  NA 136 139 136  K 3.4* 3.5 3.3*  CL 101 101 104  CO2 25  --  23  GLUCOSE 135* 131* 94  BUN 6* 4* <5*  CREATININE 0.69 0.60* 0.63  CALCIUM 9.4  --  8.6*  MG  --   --  1.6*   GFR: Estimated Creatinine Clearance:  83.1 mL/min (by C-G formula based on SCr of 0.63 mg/dL). Liver Function Tests: Recent Labs  Lab 10/02/22 1218  AST 18  ALT 17  ALKPHOS 60  BILITOT 0.5  PROT 9.3*  ALBUMIN 3.5   No results for input(s): "LIPASE", "AMYLASE" in the last 168 hours. No results for input(s): "AMMONIA" in the last 168 hours. Coagulation Profile: Recent Labs  Lab 10/02/22 1218  INR 1.3*   Cardiac Enzymes: No results for input(s): "CKTOTAL", "CKMB", "CKMBINDEX", "TROPONINI" in the last 168 hours. BNP (last 3 results) No results for input(s): "PROBNP" in the last 8760 hours. HbA1C: No results for input(s): "HGBA1C" in the last 72 hours. CBG: No results for input(s): "GLUCAP" in the last 168 hours. Lipid Profile: No results for input(s): "CHOL", "HDL", "LDLCALC", "TRIG", "CHOLHDL", "LDLDIRECT" in the last 72 hours. Thyroid Function Tests: No results for input(s): "TSH", "T4TOTAL", "FREET4", "T3FREE", "THYROIDAB" in the last 72 hours. Anemia Panel: No results for input(s): "VITAMINB12", "FOLATE", "FERRITIN", "TIBC", "IRON", "RETICCTPCT" in the last 72 hours. Sepsis Labs: Recent Labs  Lab 10/02/22 1218 10/02/22 1433  LATICACIDVEN 2.0* 1.7    Recent Results (from the past 240 hour(s))  Blood Culture (routine x 2)     Status: None (Preliminary result)   Collection Time: 10/02/22 12:18 PM   Specimen: Right Antecubital; Blood  Result Value Ref Range Status   Specimen Description   Final    RIGHT ANTECUBITAL BOTTLES DRAWN AEROBIC AND ANAEROBIC   Special Requests   Final    Blood Culture results may not be optimal due to an excessive volume of blood received in culture bottles   Culture   Final    NO GROWTH 2 DAYS Performed at [facility], [address] [place], [place] [address]    Report Status PENDING  Incomplete  Blood Culture (routine x 2)     Status: None (Preliminary result)   Collection Time: 10/02/22 12:34 PM   Specimen: BLOOD LEFT ARM  Result Value Ref Range Status   Specimen  Description BLOOD LEFT ARM BOTTLES DRAWN AEROBIC AND ANAEROBIC  Final   Special Requests Blood Culture adequate volume  Final   Culture   Final    NO GROWTH 2 DAYS Performed at [facility], [address], [place], [place] [address]    Report Status PENDING  Incomplete         Radiology Studies: ECHOCARDIOGRAM COMPLETE  Result Date: 10/03/2022    ECHOCARDIOGRAM REPORT   Patient Name:   Micheal Serrano Date of Exam: 10/03/2022 Medical Rec #:  [id number]       Height:       66.0 in Accession #:    [id number]      Weight:       154.3 lb Date of Birth:  [birth year]      BSA:  1.791 m Patient Age:    66 years        BP:           120/60 mmHg Patient Gender: M               HR:           69 bpm. Exam Location:  Inpatient Procedure: 2D Echo, Cardiac Doppler and Color Doppler Indications:    Bacteremia R78.81  History:        Patient has prior history of Echocardiogram examinations, most                 recent 09/15/2020. Signs/Symptoms:Chest Pain; Risk                 Factors:Hypertension and Current Smoker.  Sonographer:    Lucendia Herrlich Referring Phys: [id number] PRATIK D [facility] IMPRESSIONS  1. Left ventricular ejection fraction, by estimation, is 60 to 65%. The left ventricle has normal function. The left ventricle has no regional wall motion abnormalities. Left ventricular diastolic parameters were normal.  2. Right ventricular systolic function is normal. The right ventricular size is normal.  3. The mitral valve is normal in structure. No evidence of mitral valve regurgitation. No evidence of mitral stenosis.  4. The aortic valve is tricuspid. Aortic valve regurgitation is not visualized. No aortic stenosis is present.  5. The inferior vena cava is normal in size with greater than 50% respiratory variability, suggesting right atrial pressure of 3 mmHg. Comparison(s): No significant change from prior study. Prior images reviewed side by side. Conclusion(s)/Recommendation(s): No evidence of valvular  vegetations on this transthoracic echocardiogram. Consider a transesophageal echocardiogram to exclude infective endocarditis if clinically indicated. FINDINGS  Left Ventricle: Left ventricular ejection fraction, by estimation, is 60 to 65%. The left ventricle has normal function. The left ventricle has no regional wall motion abnormalities. The left ventricular internal cavity size was normal in size. There is  no left ventricular hypertrophy. Left ventricular diastolic parameters were normal. Right Ventricle: The right ventricular size is normal. No increase in right ventricular wall thickness. Right ventricular systolic function is normal. Left Atrium: Left atrial size was normal in size. Right Atrium: Right atrial size was normal in size. Pericardium: There is no evidence of pericardial effusion. Mitral Valve: The mitral valve is normal in structure. No evidence of mitral valve regurgitation. No evidence of mitral valve stenosis. Tricuspid Valve: The tricuspid valve is normal in structure. Tricuspid valve regurgitation is not demonstrated. No evidence of tricuspid stenosis. Aortic Valve: The aortic valve is tricuspid. Aortic valve regurgitation is not visualized. No aortic stenosis is present. Aortic valve peak gradient measures 3.5 mmHg. Pulmonic Valve: The pulmonic valve was normal in structure. Pulmonic valve regurgitation is not visualized. No evidence of pulmonic stenosis. Aorta: The aortic root is normal in size and structure. Venous: The inferior vena cava is normal in size with greater than 50% respiratory variability, suggesting right atrial pressure of 3 mmHg. IAS/Shunts: No atrial level shunt detected by color flow Doppler.  LEFT VENTRICLE PLAX 2D LVIDd:         4.60 cm   Diastology LVIDs:         3.40 cm   LV e' medial:    6.22 cm/s LV PW:         1.00 cm   LV E/e' medial:  10.4 LV IVS:        1.00 cm   LV e' lateral:   12.00 cm/s LVOT diam:  2.20 cm   LV E/e' lateral: 5.4 LV SV:         46 LV SV  Index:   26 LVOT Area:     3.80 cm  RIGHT VENTRICLE             IVC RV S prime:     19.30 cm/s  IVC diam: 1.10 cm TAPSE (M-mode): 2.4 cm LEFT ATRIUM             Index        RIGHT ATRIUM           Index LA diam:        3.00 cm 1.67 cm/m   RA Area:     13.40 cm LA Vol (A2C):   41.0 ml 22.89 ml/m  RA Volume:   25.60 ml  14.29 ml/m LA Vol (A4C):   69.7 ml 38.91 ml/m LA Biplane Vol: 56.9 ml 31.77 ml/m  AORTIC VALVE AV Area (Vmax): 2.54 cm AV Vmax:        93.10 cm/s AV Peak Grad:   3.5 mmHg LVOT Vmax:      62.10 cm/s LVOT Vmean:     38.633 cm/s LVOT VTI:       0.121 m  AORTA Ao Root diam: 3.80 cm Ao Asc diam:  3.70 cm MITRAL VALVE               TRICUSPID VALVE MV Area (PHT): 3.91 cm    TR Peak grad:   9.2 mmHg MV Decel Time: 194 msec    TR Vmax:        152.00 cm/s MV E velocity: 64.50 cm/s MV A velocity: 52.90 cm/s  SHUNTS MV E/A ratio:  1.22        Systemic VTI:  0.12 m                            Systemic Diam: 2.20 cm Mihai Croitoru MD Electronically signed by Thurmon Fair MD Signature Date/Time: 10/03/2022/12:22:18 PM    Final         Scheduled Meds:  apixaban  5 mg Oral BID   atorvastatin  20 mg Oral Daily   folic acid  1 mg Oral Daily   gabapentin  1,200 mg Oral BID   magnesium oxide  400 mg Oral BID   potassium chloride  20 mEq Oral BID   senna-docusate  1 tablet Oral QHS   sodium chloride flush  3 mL Intravenous Q12H   Continuous Infusions:  sodium chloride      ceFAZolin (ANCEF) IV 2 g (10/04/22 0521)     LOS: 2 days    Time spent: 35 minutes    Dorcas Carrow, MD [facility] Pager [phone]

## 2022-10-04 NOTE — Progress Notes (Addendum)
PHARMACY CONSULT NOTE FOR:  OUTPATIENT  PARENTERAL ANTIBIOTIC THERAPY (OPAT)  Indication: MSSA lumbar wound infection Regimen: Cefazolin 2g IV every 8 hours End date: 11/27/22 (8 Balik from neg BCx on 10/02/22)  IV antibiotic discharge orders are pended. To discharging provider:  please sign these orders via discharge navigator,  Select New Orders & click on the button choice - Manage This Unsigned Work.     Thank you for allowing pharmacy to be a part of this patient's care.  Georgina Pillion, PharmD, BCPS Infectious Diseases Clinical Pharmacist 10/05/2022 2:30 PM   **Pharmacist phone directory can now be found on [email or web address] ([email or web address]).  Listed under [facility].

## 2022-10-04 NOTE — TOC Initial Note (Signed)
Transition of Care [facility]) - Initial/Assessment Note    Patient Details  Name: Micheal Serrano MRN: [id number] Date of Birth: 13-Apr-1956  Transition of Care [facility]) CM/SW Contact:    Janae Bridgeman, RN Phone Number:   Clinical Narrative:                 CM met with the patient at the bedside to discuss TOC needs for IV antibiotics for home.  The patient lives with this wife at the home and the patient is pending cultures and CVL/PICC placement at this time.  I discussed with the patient that referral was placed with Jeri Modena, RNCM with [employer] and she would coordinate IV antibiotics for home.  [facility] referral would be placed as well for RN [facility] coordination and PICC line care at the home.  CM will continue to follow the patient for [facility] needs for home - pending medical readiness to return home  Expected Discharge Plan: Home w Home Health Services Barriers to Discharge: Continued Medical Work up   Patient Goals and CMS Choice Patient states their goals for this hospitalization and ongoing recovery are:: To return home CMS Medicare.gov Compare Post Acute Care list provided to:: Patient Choice offered to / list presented to : Patient [place] ownership interest in [facility].provided to:: Patient    Expected Discharge Plan and Services   Discharge Planning Services: CM Consult Post Acute Care Choice: Home Health Living arrangements for the past 2 months: Single Family Home                           HH Arranged: RN HH Agency: [employer] ([facility] referral to be placed by [facility]) Date [facility] Agency Contacted: 10/03/22 Time HH Agency Contacted: 0830 Representative spoke with at [facility] Agency: Jeri Modena, RNCM with [employer] company for IV Antibiotics needs  Prior Living Arrangements/Services Living arrangements for the past 2 months: Single Family Home Lives with:: Spouse Patient language and need for interpreter reviewed::  Yes Do you feel safe going back to the place where you live?: Yes      Need for Family Participation in Patient Care: Yes (Comment) Care giver support system in place?: Yes (comment)   Criminal Activity/Legal Involvement Pertinent to Current Situation/Hospitalization: No - Comment as needed  Activities of Daily Living      Permission Sought/Granted Permission sought to share information with : Case Manager, Family Supports Permission granted to share information with : Yes, Verbal Permission Granted     Permission granted to share info w AGENCY: Referral placed with Jeri Modena, RNCM with [employer]  Permission granted to share info w Relationship: spouse Sophie Quiles - [phone]     Emotional Assessment Appearance:: Appears stated age Attitude/Demeanor/Rapport: Gracious Affect (typically observed): Accepting Orientation: : Oriented to Self, Oriented to Place, Oriented to  Time, Oriented to Situation Alcohol / Substance Use: Not Applicable Psych Involvement: No (comment)  Admission diagnosis:  Bacteremia [R78.81] Bacteremia due to methicillin susceptible Staphylococcus aureus (MSSA) [R78.81, B95.61] Patient Active Problem List   Diagnosis Date Noted   Bacteremia due to methicillin susceptible Staphylococcus aureus (MSSA) 10/02/2022   PAD (peripheral artery disease) 08/08/2022   Atherosclerosis 08/01/2022   Hepatitis C antibody test positive 08/01/2022   Primary osteoarthritis 08/01/2022   Rheumatoid factor positive 08/01/2022   Other emphysema 03/31/2022   Seropositive rheumatoid arthritis of multiple sites 03/31/2022   Polycythemia, secondary 05/03/2021  Osteoarthritis of spine with radiculopathy, lumbar region 12/15/2020   Chronic bilateral low back pain without sciatica 11/05/2020   Cigarette nicotine dependence with nicotine-induced disorder 11/05/2020   Hardening of the aorta (main artery of the heart) 11/05/2020   History of pulmonary embolus (PE) 11/05/2020    PE (pulmonary thromboembolism) 09/15/2020   Pulmonary embolism 09/14/2020   History of fusion of cervical spine 07/04/2019   Neck pain 07/04/2019   Other specified postprocedural states 07/04/2019   Alcohol abuse    Pain in the chest    Esophageal reflux    Chest pain 12/12/2014   S/P lumbar spinal fusion 06/24/2014   HNP (herniated nucleus pulposus), lumbar 06/03/2014   Displacement of lumbar intervertebral disc without myelopathy 06/02/2014   DDD (degenerative disc disease), cervical 06/17/2013   Hepatitis, chronic active 01/06/2013   Hypokalemia 02/28/2011   Tobacco abuse 02/28/2011   GERD (gastroesophageal reflux disease) 02/28/2011   Transaminitis 02/28/2011   IFG (impaired fasting glucose) 02/28/2011   Chest pain 02/27/2011   HTN (hypertension) 02/27/2011   PCP:  Roe Rutherford, NP Pharmacy:   [facility] [place], [place] - [address] [place] [address] Phone: [phone] Fax: [phone]  [facility] - [place], [place] - [address] [place] [address] Phone: [phone] Fax: [phone]  [facility] - [place], [place] - [address] [place] [address] Phone: [phone] Fax: [phone]  [facility] [phone] - [place], [place] - [address] AT [facility] [address]  [place] [address] Phone: [phone] Fax: [phone]     Social Determinants of Health (SDOH) Social History: SDOH Screenings   Food Insecurity: No Food Insecurity (09/29/2020)  Housing: Low Risk  (09/29/2020)  Transportation Needs: No Transportation Needs (09/29/2020)  Alcohol Screen: Low Risk  (09/29/2020)  Depression (PHQ2-9): Low Risk  (09/29/2020)  Financial Resource Strain: Low Risk  (09/29/2020)  Physical Activity: Sufficiently Active (09/29/2020)  Social Connections: Socially Integrated (09/29/2020)  Stress: No Stress Concern Present  (09/29/2020)  Tobacco Use: Medium Risk (10/02/2022)   SDOH Interventions:     Readmission Risk Interventions    10/04/2022    8:30 AM  Readmission Risk Prevention Plan  Transportation Screening Complete  PCP or Specialist Appt within 3-5 Days Complete  HRI or Home Care Consult Complete  Social Work Consult for Recovery Care Planning/Counseling Complete  Palliative Care Screening Not Applicable  Medication Review Oceanographer) Complete

## 2022-10-04 NOTE — Progress Notes (Signed)
[facility]  Date of Admission:  10/02/2022      Total days of antibiotics 2   Cefazolin 4/22 >> c           ASSESSMENT: Micheal Serrano is a 67 y.o. male with MSSA bacteremia we think 2/2 vertebral infection. He underwent L5-S1 microdiscectomy 08/28/22 and developed purulent drainage from surgical incision about 3 Plaug[place] ago. In the ER on 4/13 during evaluation for this blood cultures with growth of MSSA within 24h in both sets. He was on oral antibiotics and had delay in getting back to hospital for re-evaluation until 10/02/22. He was then transferred from [facility] to [facility] for further evaluation.   MSSA Bacteremia - was on oral treatment prior to admission on 4/22, blood cultures are so far without growth. TTE was completely normal, no role for TEE given long indication for tx already. Holding on PICC line for now.   Vertebral Infection - lumbar wound drainage has decreased substantially. CT scan on 4/13 with larger fluid collection noted - FU MRI on 4/22 revealed decreased collection and changes more c/w post surgical changes as read by radiology.  Continue dry dressing to collect drainage with changes daily.  Will need NSGY follow up after hospital stay. Would anticipate given HW in place he would need longer term suppressive PO abx.   Venous Access - if continued no growth can place PICC 4/25 with anticipated long course of home IV abx.   Discharge Planning - home with [facility] support. Micheal Serrano is planning to see them sometime today/tomorrow for discussions about IV abx at home. I think they will do well - they have a great support system.   H/O Hepatitis C, GT 1a, S/P Tx - nothing further needed. Cured w/o risk for re-infection.   H/O Rheumatoid Arthritis on Humira - needs follow up with rheumatologist Dierdre Forth) outpatient to discuss further tx plans. Would recommend holding Humira injections during severe infection treatment. Not sure he would be a  good candidate to resume this infection would likely require suppression.    PLAN: Continue cefazolin Follow blood cultures  PICC and D/C planned for Friday 4/26 as of now  Home Health to be set up Hold Humira     Principal Problem:   Bacteremia due to methicillin susceptible Staphylococcus aureus (MSSA) Active Problems:   HTN (hypertension)   Tobacco abuse   GERD (gastroesophageal reflux disease)   Chronic bilateral low back pain without sciatica   Hepatitis C antibody test positive    atorvastatin  20 mg Oral Daily   enoxaparin (LOVENOX) injection  1 mg/kg Subcutaneous Q12H   folic acid  1 mg Oral Daily   gabapentin  1,200 mg Oral BID   potassium chloride  20 mEq Oral BID   senna-docusate  1 tablet Oral QHS   sodium chloride flush  3 mL Intravenous Q12H    SUBJECTIVE: Slept well last night, more lower back pain this morning than last night.  Some leaking of the pump channel on the IV tubing that his wife has noticed.   Review of Systems: Review of Systems  Constitutional:  Negative for chills, fever and malaise/fatigue.  Respiratory: Negative.    Cardiovascular: Negative.   Gastrointestinal:  Negative for abdominal pain, diarrhea, nausea and vomiting.  Musculoskeletal:  Positive for back pain.     No Known Allergies  OBJECTIVE: Vitals:   10/03/22 1624 10/03/22 1947 10/04/22 1610 10/04/22 [id number]  BP: 129/81 125/70 (!) 150/90 (!) 140/73  Pulse: 76 78 75 67  Resp: Temp: 97.8 F (36.6 C) 97.7 F (36.5 C) 98.1 F (36.7 C) 98 F (36.7 C)  TempSrc: Oral Oral Oral   SpO2: 94% 95% 91% 93%  Weight:      Height:       Body mass index is 24.91 kg/m.  Physical Exam Constitutional:      Appearance: Normal appearance.  Cardiovascular:     Rate and Rhythm: Normal rate.  Abdominal:     General: There is no distension.     Palpations: Abdomen is soft.     Tenderness: There is no abdominal tenderness.  Skin:    General: Skin is warm and dry.      Capillary Refill: Capillary refill takes less than 2 seconds.  Neurological:     Mental Status: He is alert and oriented to person, place, and time.     Lab Results Lab Results  Component Value Date   WBC 8.6 10/03/2022   HGB 11.8 (L) 10/03/2022   HCT 35.7 (L) 10/03/2022   MCV 93.2 10/03/2022   PLT 348 10/03/2022    Lab Results  Component Value Date   CREATININE 0.63 10/03/2022   BUN <5 (L) 10/03/2022   NA 136 10/03/2022   K 3.3 (L) 10/03/2022   CL 104 10/03/2022   CO2 23 10/03/2022    Lab Results  Component Value Date   ALT 17 10/02/2022   AST 18 10/02/2022   ALKPHOS 60 10/02/2022   BILITOT 0.5 10/02/2022     Microbiology: Recent Results (from the past 240 hour(s))  Blood Culture (routine x 2)     Status: None (Preliminary result)   Collection Time: 10/02/22 12:18 PM   Specimen: Right Antecubital; Blood  Result Value Ref Range Status   Specimen Description   Final    RIGHT ANTECUBITAL BOTTLES DRAWN AEROBIC AND ANAEROBIC   Special Requests   Final    Blood Culture results may not be optimal due to an excessive volume of blood received in culture bottles   Culture   Final    NO GROWTH 2 DAYS Performed at [facility], [address], [place], [place] [address]    Report Status PENDING  Incomplete  Blood Culture (routine x 2)     Status: None (Preliminary result)   Collection Time: 10/02/22 12:34 PM   Specimen: BLOOD LEFT ARM  Result Value Ref Range Status   Specimen Description BLOOD LEFT ARM BOTTLES DRAWN AEROBIC AND ANAEROBIC  Final   Special Requests Blood Culture adequate volume  Final   Culture   Final    NO GROWTH 2 DAYS Performed at [facility], [address], [place], [place] [address]    Report Status PENDING  Incomplete     Rexene Alberts, MSN, NP-C [facility]  [email or web address] Pager: [phone] Office: [phone] [facility] Main Line: [phone] *Secure Chat  Communication Welcome

## 2022-10-04 NOTE — Plan of Care (Signed)
Patient AOX4, VSS throughout shift. All meds given on time as ordered. Pt c/o hip and back pain relieved by PRN norco and warm blankets. Diminished lungs, IS encouraged. Pt voided in urinal. Pt ambulated with steady gait. POC maintained, will continue to monitor.   Problem: Education: Goal: Knowledge of General Education information will improve Description: Including pain rating scale, medication(s)/side effects and non-pharmacologic comfort measures Outcome: Progressing   Problem: Health Behavior/Discharge Planning: Goal: Ability to manage health-related needs will improve Outcome: Progressing   Problem: Clinical Measurements: Goal: Ability to maintain clinical measurements within normal limits will improve Outcome: Progressing Goal: Will remain free from infection Outcome: Progressing Goal: Diagnostic test results will improve Outcome: Progressing Goal: Respiratory complications will improve Outcome: Progressing Goal: Cardiovascular complication will be avoided Outcome: Progressing   Problem: Activity: Goal: Risk for activity intolerance will decrease Outcome: Progressing   Problem: Nutrition: Goal: Adequate nutrition will be maintained Outcome: Progressing   Problem: Coping: Goal: Level of anxiety will decrease Outcome: Progressing   Problem: Elimination: Goal: Will not experience complications related to bowel motility Outcome: Progressing Goal: Will not experience complications related to urinary retention Outcome: Progressing   Problem: Pain Managment: Goal: General experience of comfort will improve Outcome: Progressing   Problem: Safety: Goal: Ability to remain free from injury will improve Outcome: Progressing   Problem: Skin Integrity: Goal: Risk for impaired skin integrity will decrease Outcome: Progressing

## 2022-10-05 ENCOUNTER — Other Ambulatory Visit: Payer: Self-pay

## 2022-10-05 DIAGNOSIS — B9561 Methicillin susceptible Staphylococcus aureus infection as the cause of diseases classified elsewhere: Secondary | ICD-10-CM | POA: Diagnosis not present

## 2022-10-05 DIAGNOSIS — R7881 Bacteremia: Secondary | ICD-10-CM | POA: Diagnosis not present

## 2022-10-05 LAB — CBC WITH DIFFERENTIAL/PLATELET
Abs Immature Granulocytes: 0.01 10*3/uL (ref 0.00–0.07)
Basophils Absolute: 0 10*3/uL (ref 0.0–0.1)
Basophils Relative: 1 %
Eosinophils Absolute: 0.4 10*3/uL (ref 0.0–0.5)
Eosinophils Relative: 5 %
HCT: 37 % — ABNORMAL LOW (ref 39.0–52.0)
Hemoglobin: 12.5 g/dL — ABNORMAL LOW (ref 13.0–17.0)
Immature Granulocytes: 0 %
Lymphocytes Relative: 39 %
Lymphs Abs: 3 10*3/uL (ref 0.7–4.0)
MCH: 30.8 pg (ref 26.0–34.0)
MCHC: 33.8 g/dL (ref 30.0–36.0)
MCV: 91.1 fL (ref 80.0–100.0)
Monocytes Absolute: 0.9 10*3/uL (ref 0.1–1.0)
Monocytes Relative: 11 %
Neutro Abs: 3.5 10*3/uL (ref 1.7–7.7)
Neutrophils Relative %: 44 %
Platelets: 357 10*3/uL (ref 150–400)
RBC: 4.06 MIL/uL — ABNORMAL LOW (ref 4.22–5.81)
RDW: 12.8 % (ref 11.5–15.5)
WBC: 7.8 10*3/uL (ref 4.0–10.5)
nRBC: 0 % (ref 0.0–0.2)

## 2022-10-05 LAB — COMPREHENSIVE METABOLIC PANEL
ALT: 10 U/L (ref 0–44)
AST: 13 U/L — ABNORMAL LOW (ref 15–41)
Albumin: 2.6 g/dL — ABNORMAL LOW (ref 3.5–5.0)
Alkaline Phosphatase: 45 U/L (ref 38–126)
Anion gap: 9 (ref 5–15)
BUN: <5 mg/dL — ABNORMAL LOW (ref 8–23)
CO2: 24 mmol/L (ref 22–32)
Calcium: 8.9 mg/dL (ref 8.9–10.3)
Chloride: 107 mmol/L (ref 98–111)
Creatinine, Ser: 0.75 mg/dL (ref 0.61–1.24)
GFR, Estimated: >60 mL/min (ref >60)
Glucose, Bld: 96 mg/dL (ref 70–99)
Potassium: 4.1 mmol/L (ref 3.5–5.1)
Sodium: 140 mmol/L (ref 135–145)
Total Bilirubin: 0.5 mg/dL (ref 0.3–1.2)
Total Protein: 6.8 g/dL (ref 6.5–8.1)

## 2022-10-05 LAB — MAGNESIUM: Magnesium: 1.9 mg/dL (ref 1.7–2.4)

## 2022-10-05 LAB — CULTURE, BLOOD (ROUTINE X 2)
Culture: NO GROWTH
Culture: NO GROWTH

## 2022-10-05 LAB — PHOSPHORUS: Phosphorus: 4.2 mg/dL (ref 2.5–4.6)

## 2022-10-05 MED ORDER — CALCIUM CARBONATE ANTACID 500 MG PO CHEW
1.0000 | CHEWABLE_TABLET | Freq: Three times a day (TID) | ORAL | Status: DC
  Administered 2022-10-05: 200 mg via ORAL
  Filled 2022-10-05: qty 1

## 2022-10-05 MED ORDER — SODIUM CHLORIDE 0.9% FLUSH: 10.0000 mL | INTRAVENOUS | Status: DC | PRN

## 2022-10-05 MED ORDER — CHLORHEXIDINE GLUCONATE CLOTH 2 % EX PADS: 6.0000 | MEDICATED_PAD | Freq: Every day | CUTANEOUS | Status: DC

## 2022-10-05 MED ORDER — SODIUM CHLORIDE 0.9% FLUSH: 10.0000 mL | Freq: Two times a day (BID) | INTRAVENOUS | Status: DC

## 2022-10-05 NOTE — Plan of Care (Signed)
Patient AOX4, VSS throughout shift. All meds given on time as ordered. Pt c/o hip and back pain relieved by PRN norco and warm blankets. Diminished lungs, IS encouraged. Pt voided in urinal. Pt ambulated with steady gait. Pt took shower last night and bed linen changed.  POC maintained, will continue to monitor.   Problem: Education: Goal: Knowledge of General Education information will improve Description: Including pain rating scale, medication(s)/side effects and non-pharmacologic comfort measures Outcome: Progressing   Problem: Health Behavior/Discharge Planning: Goal: Ability to manage health-related needs will improve Outcome: Progressing   Problem: Clinical Measurements: Goal: Ability to maintain clinical measurements within normal limits will improve Outcome: Progressing Goal: Will remain free from infection Outcome: Progressing Goal: Diagnostic test results will improve Outcome: Progressing Goal: Respiratory complications will improve Outcome: Progressing Goal: Cardiovascular complication will be avoided Outcome: Progressing   Problem: Activity: Goal: Risk for activity intolerance will decrease Outcome: Progressing   Problem: Nutrition: Goal: Adequate nutrition will be maintained Outcome: Progressing   Problem: Coping: Goal: Level of anxiety will decrease Outcome: Progressing   Problem: Elimination: Goal: Will not experience complications related to bowel motility Outcome: Progressing Goal: Will not experience complications related to urinary retention Outcome: Progressing   Problem: Pain Managment: Goal: General experience of comfort will improve Outcome: Progressing   Problem: Safety: Goal: Ability to remain free from injury will improve Outcome: Progressing   Problem: Skin Integrity: Goal: Risk for impaired skin integrity will decrease Outcome: Progressing

## 2022-10-05 NOTE — Progress Notes (Signed)
PROGRESS NOTE    Micheal Serrano  ZOX:[id number] DOB: Jun 25, 1955 DOA: 10/02/2022 PCP: Roe Rutherford, NP    Brief Narrative:  67 year old with history of hypertension, GERD, hep C, polycythemia vera, PE on Eliquis, recent L5-S1 microdiscectomy on 3/18 at [facility], smoker presented to [facility], ER on 4/13 with pain everywhere.  He was also noted to have drainage from his surgical site of the low back.  Was placed on ciprofloxacin and recommended follow-up with neurosurgery.  He was found to have positive blood cultures and called back to ER.  Blood culture showed MSSA bacteremia, MRI on 4/19 with no significant findings.  Seen at [facility], ER, transferred to [facility] for ID consultation.   Assessment & Plan:   MSSA bacteremia due to recent spinal surgery: Hemodynamically stable. Currently on Ancef. TTE with no evidence of vegetation. Blood cultures 4/13 MSSA Blood cultures 4/22, pending.  Negative so far. ID following. MRI lumbar spine 4/19 with postoperative changes, no epidural abscess or osteomyelitis.  He was seen by his neurosurgery in the office and recommended oral antibiotics. Pain managed with oral pain medications.  Continue mobility. Anticipate to home with 6 Neubecker of Ancef when cleared for PICC line. Dry dressing in the surgical wound.  History of pulmonary embolism: Do not anticipate procedure.  Go back on Eliquis.  GERD: PPI  Smoker: Smokes 10 cigarettes/day.  Counseled on cessation.  Nicotine patch.  Alcoholism with alcohol abuse: Counseled on cessation.  Monitor for withdrawal symptoms.  Stable so far.  Hepatitis C: Treated.  Not active.  Rheumatoid arthritis: On Humira that is on hold.  Continue to hold until follow-up.  Hypomagnesemia: Replaced.     DVT prophylaxis:  apixaban (ELIQUIS) tablet 5 mg   Code Status: Full code Family Communication: Wife at the bedside. Disposition Plan: Status is: Inpatient Remains inpatient appropriate  because: Treatment for bacteremia     Consultants:  Infectious disease  Procedures:  None  Antimicrobials:  Ancef 4/22---   Subjective:  Seen and examined.  No overnight events.  He has some more drainage from the surgical wound that I examined.  Objective: Vitals:   10/04/22 1630 10/04/22 2024 10/05/22 0629 10/05/22 0751  BP: 130/80 (!) 148/85 137/81 (!) 148/90  Pulse: 78 74 67 69  Resp: 18 15 16 20   Temp: 98 F (36.7 C) 98.2 F (36.8 C) 97.8 F (36.6 C) 98 F (36.7 C)  TempSrc:  Oral Oral   SpO2: 96% 98% 96% 95%  Weight:      Height:        Intake/Output Summary (Last 24 hours) at 10/05/2022 1041 Last data filed at 10/04/2022 2025 Gross per 24 hour  Intake 200 ml  Output 300 ml  Net -100 ml    Filed Weights   10/02/22 1127  Weight: 70 kg    Examination:  General exam: Looks comfortable. Respiratory system: No added sounds. Cardiovascular system: S1 & S2 heard, RRR. No pedal edema. Gastrointestinal system: Abdomen is nondistended, soft and nontender. No organomegaly or masses felt. Normal bowel sounds heard. Central nervous system: Alert and oriented. No focal neurological deficits. Extremities: Symmetric 5 x 5 power. Skin:  Small gaping surgical wound with serosanguineous discharge.  No purulence.    Data Reviewed: I have personally reviewed following labs and imaging studies  CBC: Recent Labs  Lab 10/02/22 1218 10/02/22 1229 10/03/22 0452 10/05/22 0451  WBC 8.0  --  8.6 7.8  NEUTROABS 4.2  --   --  3.5  HGB 15.4 17.0 11.8* 12.5*  HCT 46.6 50.0 35.7* 37.0*  MCV 93.0  --  93.2 91.1  PLT 425*  --  348 357    Basic Metabolic Panel: Recent Labs  Lab 10/02/22 1218 10/02/22 1229 10/03/22 0452 10/05/22 0451  NA 136 139 136 140  K 3.4* 3.5 3.3* 4.1  CL 101 101 104 107  CO2 25  --  23 24  GLUCOSE 135* 131* 94 96  BUN 6* 4* <5* <5*  CREATININE 0.69 0.60* 0.63 0.75  CALCIUM 9.4  --  8.6* 8.9  MG  --   --  1.6* 1.9  PHOS  --   --   --   4.2    GFR: Estimated Creatinine Clearance: 83.1 mL/min (by C-G formula based on SCr of 0.75 mg/dL). Liver Function Tests: Recent Labs  Lab 10/02/22 1218 10/05/22 0451  AST 18 13*  ALT 17 10  ALKPHOS 60 45  BILITOT 0.5 0.5  PROT 9.3* 6.8  ALBUMIN 3.5 2.6*    No results for input(s): "LIPASE", "AMYLASE" in the last 168 hours. No results for input(s): "AMMONIA" in the last 168 hours. Coagulation Profile: Recent Labs  Lab 10/02/22 1218  INR 1.3*    Cardiac Enzymes: No results for input(s): "CKTOTAL", "CKMB", "CKMBINDEX", "TROPONINI" in the last 168 hours. BNP (last 3 results) No results for input(s): "PROBNP" in the last 8760 hours. HbA1C: No results for input(s): "HGBA1C" in the last 72 hours. CBG: No results for input(s): "GLUCAP" in the last 168 hours. Lipid Profile: No results for input(s): "CHOL", "HDL", "LDLCALC", "TRIG", "CHOLHDL", "LDLDIRECT" in the last 72 hours. Thyroid Function Tests: No results for input(s): "TSH", "T4TOTAL", "FREET4", "T3FREE", "THYROIDAB" in the last 72 hours. Anemia Panel: No results for input(s): "VITAMINB12", "FOLATE", "FERRITIN", "TIBC", "IRON", "RETICCTPCT" in the last 72 hours. Sepsis Labs: Recent Labs  Lab 10/02/22 1218 10/02/22 1433  LATICACIDVEN 2.0* 1.7     Recent Results (from the past 240 hour(s))  Blood Culture (routine x 2)     Status: None (Preliminary result)   Collection Time: 10/02/22 12:18 PM   Specimen: Right Antecubital; Blood  Result Value Ref Range Status   Specimen Description   Final    RIGHT ANTECUBITAL BOTTLES DRAWN AEROBIC AND ANAEROBIC   Special Requests   Final    Blood Culture results may not be optimal due to an excessive volume of blood received in culture bottles   Culture   Final    NO GROWTH 3 DAYS Performed at [facility], [address] [place], [place] [address]    Report Status PENDING  Incomplete  Blood Culture (routine x 2)     Status: None (Preliminary result)   Collection Time:  10/02/22 12:34 PM   Specimen: BLOOD LEFT ARM  Result Value Ref Range Status   Specimen Description BLOOD LEFT ARM BOTTLES DRAWN AEROBIC AND ANAEROBIC  Final   Special Requests Blood Culture adequate volume  Final   Culture   Final    NO GROWTH 3 DAYS Performed at [facility], [address], [place], [place] [address]    Report Status PENDING  Incomplete         Radiology Studies: ECHOCARDIOGRAM COMPLETE  Result Date: 10/03/2022    ECHOCARDIOGRAM REPORT   Patient Name:   Micheal Serrano Date of Exam: 10/03/2022 Medical Rec #:  [id number]       Height:       66.0 in Accession #:    [id number]  Weight:       154.3 lb Date of Birth:  [birth year]      BSA:          1.791 m Patient Age:    66 years        BP:           120/60 mmHg Patient Gender: M               HR:           69 bpm. Exam Location:  Inpatient Procedure: 2D Echo, Cardiac Doppler and Color Doppler Indications:    Bacteremia R78.81  History:        Patient has prior history of Echocardiogram examinations, most                 recent 09/15/2020. Signs/Symptoms:Chest Pain; Risk                 Factors:Hypertension and Current Smoker.  Sonographer:    Lucendia Herrlich Referring Phys: [id number] PRATIK D [facility] IMPRESSIONS  1. Left ventricular ejection fraction, by estimation, is 60 to 65%. The left ventricle has normal function. The left ventricle has no regional wall motion abnormalities. Left ventricular diastolic parameters were normal.  2. Right ventricular systolic function is normal. The right ventricular size is normal.  3. The mitral valve is normal in structure. No evidence of mitral valve regurgitation. No evidence of mitral stenosis.  4. The aortic valve is tricuspid. Aortic valve regurgitation is not visualized. No aortic stenosis is present.  5. The inferior vena cava is normal in size with greater than 50% respiratory variability, suggesting right atrial pressure of 3 mmHg. Comparison(s): No significant change from prior study.  Prior images reviewed side by side. Conclusion(s)/Recommendation(s): No evidence of valvular vegetations on this transthoracic echocardiogram. Consider a transesophageal echocardiogram to exclude infective endocarditis if clinically indicated. FINDINGS  Left Ventricle: Left ventricular ejection fraction, by estimation, is 60 to 65%. The left ventricle has normal function. The left ventricle has no regional wall motion abnormalities. The left ventricular internal cavity size was normal in size. There is  no left ventricular hypertrophy. Left ventricular diastolic parameters were normal. Right Ventricle: The right ventricular size is normal. No increase in right ventricular wall thickness. Right ventricular systolic function is normal. Left Atrium: Left atrial size was normal in size. Right Atrium: Right atrial size was normal in size. Pericardium: There is no evidence of pericardial effusion. Mitral Valve: The mitral valve is normal in structure. No evidence of mitral valve regurgitation. No evidence of mitral valve stenosis. Tricuspid Valve: The tricuspid valve is normal in structure. Tricuspid valve regurgitation is not demonstrated. No evidence of tricuspid stenosis. Aortic Valve: The aortic valve is tricuspid. Aortic valve regurgitation is not visualized. No aortic stenosis is present. Aortic valve peak gradient measures 3.5 mmHg. Pulmonic Valve: The pulmonic valve was normal in structure. Pulmonic valve regurgitation is not visualized. No evidence of pulmonic stenosis. Aorta: The aortic root is normal in size and structure. Venous: The inferior vena cava is normal in size with greater than 50% respiratory variability, suggesting right atrial pressure of 3 mmHg. IAS/Shunts: No atrial level shunt detected by color flow Doppler.  LEFT VENTRICLE PLAX 2D LVIDd:         4.60 cm   Diastology LVIDs:         3.40 cm   LV e' medial:    6.22 cm/s LV PW:         1.00 cm  LV E/e' medial:  10.4 LV IVS:        1.00 cm   LV e'  lateral:   12.00 cm/s LVOT diam:     2.20 cm   LV E/e' lateral: 5.4 LV SV:         46 LV SV Index:   26 LVOT Area:     3.80 cm  RIGHT VENTRICLE             IVC RV S prime:     19.30 cm/s  IVC diam: 1.10 cm TAPSE (M-mode): 2.4 cm LEFT ATRIUM             Index        RIGHT ATRIUM           Index LA diam:        3.00 cm 1.67 cm/m   RA Area:     13.40 cm LA Vol (A2C):   41.0 ml 22.89 ml/m  RA Volume:   25.60 ml  14.29 ml/m LA Vol (A4C):   69.7 ml 38.91 ml/m LA Biplane Vol: 56.9 ml 31.77 ml/m  AORTIC VALVE AV Area (Vmax): 2.54 cm AV Vmax:        93.10 cm/s AV Peak Grad:   3.5 mmHg LVOT Vmax:      62.10 cm/s LVOT Vmean:     38.633 cm/s LVOT VTI:       0.121 m  AORTA Ao Root diam: 3.80 cm Ao Asc diam:  3.70 cm MITRAL VALVE               TRICUSPID VALVE MV Area (PHT): 3.91 cm    TR Peak grad:   9.2 mmHg MV Decel Time: 194 msec    TR Vmax:        152.00 cm/s MV E velocity: 64.50 cm/s MV A velocity: 52.90 cm/s  SHUNTS MV E/A ratio:  1.22        Systemic VTI:  0.12 m                            Systemic Diam: 2.20 cm Mihai Croitoru MD Electronically signed by Thurmon Fair MD Signature Date/Time: 10/03/2022/12:22:18 PM    Final         Scheduled Meds:  apixaban  5 mg Oral BID   atorvastatin  20 mg Oral Daily   folic acid  1 mg Oral Daily   gabapentin  1,200 mg Oral BID   magnesium oxide  400 mg Oral BID   senna-docusate  1 tablet Oral QHS   sodium chloride flush  3 mL Intravenous Q12H   Continuous Infusions:  sodium chloride      ceFAZolin (ANCEF) IV 2 g (10/05/22 0621)     LOS: 3 days    Time spent: 35 minutes    Dorcas Carrow, MD [facility] Pager [phone]

## 2022-10-05 NOTE — Progress Notes (Signed)
Received confirmation via Rexene Alberts with ID to place PICC line today, 4/25, as pt likely to discharge.

## 2022-10-05 NOTE — Progress Notes (Signed)
Peripherally Inserted Central Catheter Placement  The IV Nurse has discussed with the patient and/or persons authorized to consent for the patient, the purpose of this procedure and the potential benefits and risks involved with this procedure.  The benefits include less needle sticks, lab draws from the catheter, and the patient may be discharged home with the catheter. Risks include, but not limited to, infection, bleeding, blood clot (thrombus formation), and puncture of an artery; nerve damage and irregular heartbeat and possibility to perform a PICC exchange if needed/ordered by physician.  Alternatives to this procedure were also discussed.  Bard Power PICC patient education guide, fact sheet on infection prevention and patient information card has been provided to patient /or left at bedside.    PICC Placement Documentation  PICC Single Lumen 10/05/22 Right Basilic 39 cm 0 cm (Active)  Indication for Insertion or Continuance of Line Home intravenous therapies (PICC only) 10/05/22 1718  Exposed Catheter (cm) 0 cm 10/05/22 1718  Site Assessment Clean, Dry, Intact 10/05/22 1718  Line Status Flushed;Saline locked;Blood return noted 10/05/22 1718  Dressing Type Transparent;Securing device 10/05/22 1718  Dressing Status Antimicrobial disc in place 10/05/22 1718  Safety Lock Not Applicable 10/05/22 1718  Line Care Connections checked and tightened 10/05/22 1718  Line Adjustment (NICU/IV Team Only) No 10/05/22 1718  Dressing Intervention New dressing 10/05/22 1718  Dressing Change Due 10/12/22 10/05/22 1718       Vernona Rieger  Jorie Zee 10/05/2022, 5:20 PM

## 2022-10-05 NOTE — TOC Progression Note (Addendum)
Transition of Care [facility]) - Progression Note    Patient Details  Name: Micheal Serrano MRN: [id number] Date of Birth: Feb 23, 1956  Transition of Care [facility] CM/SW Contact  Janae Bridgeman, RN Phone Number: 10/05/2022, 3:21 PM  Clinical Narrative:    CM met with the patient at the bedside to discuss IV antibiotics for home and the patient may likely have PICC line placed this afternoon.  I sent a message to Jeri Modena, CM with [employer] to check on when patient's home medications will be delivered to the home.  Attending MD is aware along with bedside nursing that last scheduled dose of antibiotics is due tonight at 10 pm.  CM will continue to follow the patient for discharge planning needs.  The patient is anxious to discharge home.  10/05/22 1610 - I called and spoke with Jeri Modena, [employer] CM and the patient's antibiotics wioll be delivered to the home tonight  at 9 pm.  PICC Team is at the bedside now to place the PICC line.  The patient's home health RN will be at the home in the am to start services through [facility] and patient is aware and it's noted in the discharge instructions.    Bedside nursing is aware and will discharge him home once attending MD has confirmed line placement is okay.  Expected Discharge Plan: Home w Home Health Services Barriers to Discharge: Continued Medical Work up  Expected Discharge Plan and Services   Discharge Planning Services: CM Consult Post Acute Care Choice: Home Health Living arrangements for the past 2 months: Single Family Home Expected Discharge Date: 10/05/22                         HH Arranged: RN HH Agency: [employer] ([facility] referral to be placed by [facility]) Date [facility] Agency Contacted: 10/03/22 Time HH Agency Contacted: 0830 Representative spoke with at [facility] Agency: Jeri Modena, RNCM with [employer] DME company for IV Antibiotics needs   Social Determinants of Health (SDOH) Interventions SDOH  Screenings   Food Insecurity: No Food Insecurity (09/29/2020)  Housing: Low Risk  (09/29/2020)  Transportation Needs: No Transportation Needs (09/29/2020)  Alcohol Screen: Low Risk  (09/29/2020)  Depression (PHQ2-9): Low Risk  (09/29/2020)  Financial Resource Strain: Low Risk  (09/29/2020)  Physical Activity: Sufficiently Active (09/29/2020)  Social Connections: Socially Integrated (09/29/2020)  Stress: No Stress Concern Present (09/29/2020)  Tobacco Use: Medium Risk (10/02/2022)    Readmission Risk Interventions    10/04/2022    8:30 AM  Readmission Risk Prevention Plan  Transportation Screening Complete  PCP or Specialist Appt within 3-5 Days Complete  HRI or Home Care Consult Complete  Social Work Consult for Recovery Care Planning/Counseling Complete  Palliative Care Screening Not Applicable  Medication Review Oceanographer) Complete

## 2022-10-05 NOTE — Progress Notes (Signed)
Brief ID Note:  Micheal Serrano is a 67 y.o. male with MSSA lumbosacral infection. Notified by home health that they would like to be ready to discharge today if possible. Will place orders for PICC and OPAT as follows.    Outpatient FU arranged and placed on DC AVS.    OPAT ORDERS:  Diagnosis: lumbosacral infection  Culture Result: MSSA   No Known Allergies   Discharge antibiotics to be given via PICC line: Cefazolin 2 gm IV q8h   Duration: 8 [place]    End Date: 11/27/2022  [facility] Care Per Protocol with [employer] Use: Home health RN for IV administration and teaching, line care and labs.    Labs weekly while on IV antibiotics: _x_ CBC with differential __ BMP **TWICE WEEKLY ON VANCOMYCIN  _x_ CMP _x_ CRP __ ESR __ Vancomycin trough TWICE WEEKLY __ CK  _x_ Please pull PIC at completion of IV antibiotics __ Please leave PIC in place until doctor has seen patient or been notified  Fax weekly labs to [phone]  Clinic Follow Up Appt: 5/28 @ 3:00 pm with Judeth Cornfield, NP     Rexene Alberts, MSN, NP-C [facility]  [email or web address] Pager: [phone] Office: [phone] [facility] Main Line: [phone] *Secure Chat Communication Welcome

## 2022-10-06 LAB — CULTURE, BLOOD (ROUTINE X 2)

## 2022-10-06 NOTE — Discharge Summary (Signed)
Physician Discharge Summary  Micheal Serrano ZOX:[id number] DOB: 23-Jan-1956 DOA: 10/02/2022  PCP: Roe Rutherford, NP  Admit date: 10/02/2022 Discharge date: 10/06/2022  Admitted From: Home Disposition: Home with home infusion therapy  Recommendations for Outpatient Follow-up:  Follow up with PCP in 1-2 [place] Antibiotics management, follow-up labs and toxicity surveillance with ID clinic.  Home Health: Home health RN, Equipment/Devices: Antibiotic infusion, PICC line management  Discharge Condition: Stable CODE STATUS: Full code Diet recommendation: Low-salt diet  Discharge summary: 67 year old with history of hypertension, GERD, hep C, polycythemia vera, PE on Eliquis, recent L5-S1 microdiscectomy on 3/18 at [facility] health, smoker presented to [facility], ER on 4/13 with pain everywhere.  He was also noted to have drainage from his surgical site of the low back.  Was placed on ciprofloxacin and recommended follow-up with neurosurgery.  He was found to have positive blood cultures and called back to ER.  Blood culture showed MSSA bacteremia, MRI on 4/19 with no significant findings.  Seen at [facility], ER, transferred to [facility] for ID consultation.  MSSA bacteremia due to recent spinal surgery: Hemodynamically stable. TTE with no evidence of vegetation. Blood cultures 4/13 MSSA Blood cultures 4/22, negative so far.  Patient received ciprofloxacin in between cultures. Seen and followed by infectious disease team. MRI lumbar spine 4/19 with postoperative changes, no epidural abscess or osteomyelitis.  He was seen by his neurosurgery in the office and recommended oral antibiotics. Pain managed with oral pain medications.  Continue mobility. With negative cultures, patient was discharged home with PICC line and 6 [place] of IV antibiotic therapy.  Drug monitoring, toxicity monitoring lab test to be done by home health and will be communicated to ID clinic. Dry dressing for back wound.   Follow-up with your neurosurgery.   History of pulmonary embolism: Therapeutic on Eliquis.   GERD: PPI   Smoker: Smokes 10 cigarettes/day.  Counseled on cessation.  Nicotine patch.   Alcoholism with alcohol abuse: Counseled on cessation.  No evidence of drawl.  Counseled.   Hepatitis C: Treated.  Not active.   Rheumatoid arthritis: On Humira that is on hold.  Continue to hold until follow-up.   Hypomagnesemia: Replaced and adequate.  Stable for discharge.  Discharge Diagnoses:  Principal Problem:   Bacteremia due to methicillin susceptible Staphylococcus aureus (MSSA) Active Problems:   Tobacco abuse   HTN (hypertension)   GERD (gastroesophageal reflux disease)   Chronic bilateral low back pain without sciatica   Hepatitis C antibody test positive    Discharge Instructions  Discharge Instructions     Diet - low sodium heart healthy   Complete by: As directed    Discharge wound care:   Complete by: As directed    Keep the wound dry and clean , covered with sterile dressing   Increase activity slowly   Complete by: As directed       Allergies as of 10/05/2022   No Known Allergies      Medication List     STOP taking these medications    ciprofloxacin 500 MG tablet Commonly known as: CIPRO   doxycycline 100 MG capsule Commonly known as: VIBRAMYCIN   olmesartan 40 MG tablet Commonly known as: BENICAR   predniSONE 50 MG tablet Commonly known as: DELTASONE       TAKE these medications    acetaminophen 325 MG tablet Commonly known as: TYLENOL Take 650 mg by mouth every 6 (six) hours as needed for mild pain.   apixaban 5 MG  Tabs tablet Commonly known as: ELIQUIS Take 1 tablet (5 mg total) by mouth 2 (two) times daily.   atorvastatin 20 MG tablet Commonly known as: LIPITOR Take 20 mg by mouth daily.   folic acid 1 MG tablet Commonly known as: FOLVITE Take 1 mg by mouth daily.   gabapentin 600 MG tablet Commonly known as: NEURONTIN Take  1,200 mg by mouth 2 (two) times daily.   HYDROcodone-acetaminophen 10-325 MG tablet Commonly known as: NORCO Take 1 tablet by mouth every 6 (six) hours as needed for moderate pain.               Discharge Care Instructions  (From admission, onward)           Start     Ordered   10/05/22 0000  Discharge wound care:       Comments: Keep the wound dry and clean , covered with sterile dressing   10/05/22 1423            Follow-up Information     [facility] Follow up.   Why: [facility] will provide home anitbiotics coordination and delivery.        [facility] Follow up.   Why: [facility] will providing home health RN for PICC line care.        [facility] for Infectious Disease Follow up on 11/07/2022.   Specialty: Infectious Diseases Why: Hospital Follow up Appointment with Judeth Cornfield @ 3:00 pm  please arrive 30 min early to park (we have some surrounding construction going on). Contact information: [address] [place], [address] [id number] mc [place] [address] [phone]               No Known Allergies  Consultations: Infectious disease   Procedures/Studies: [place] EKG SITE RITE  Result Date: 10/05/2022 If Site Rite image not attached, placement could not be confirmed due to current cardiac rhythm.  ECHOCARDIOGRAM COMPLETE  Result Date: 10/03/2022    ECHOCARDIOGRAM REPORT   Patient Name:   Micheal Serrano Date of Exam: 10/03/2022 Medical Rec #:  [id number]       Height:       66.0 in Accession #:    [id number]      Weight:       154.3 lb Date of Birth:  1956-01-22      BSA:          1.791 m Patient Age:    67 years        BP:           120/60 mmHg Patient Gender: M               HR:           69 bpm. Exam Location:  Inpatient Procedure: 2D Echo, Cardiac Doppler and Color Doppler Indications:    Bacteremia R78.81  History:        Patient has prior history of Echocardiogram examinations, most                  recent 09/15/2020. Signs/Symptoms:Chest Pain; Risk                 Factors:Hypertension and Current Smoker.  Sonographer:    Lucendia Herrlich Referring Phys: [id number] PRATIK D [facility] IMPRESSIONS  1. Left ventricular ejection fraction, by estimation, is 60 to 65%. The left ventricle has normal function. The left ventricle has no regional wall motion abnormalities. Left ventricular diastolic parameters were normal.  2. Right ventricular systolic function is normal. The right ventricular size is normal.  3. The mitral valve is normal in structure. No evidence of mitral valve regurgitation. No evidence of mitral stenosis.  4. The aortic valve is tricuspid. Aortic valve regurgitation is not visualized. No aortic stenosis is present.  5. The inferior vena cava is normal in size with greater than 50% respiratory variability, suggesting right atrial pressure of 3 mmHg. Comparison(s): No significant change from prior study. Prior images reviewed side by side. Conclusion(s)/Recommendation(s): No evidence of valvular vegetations on this transthoracic echocardiogram. Consider a transesophageal echocardiogram to exclude infective endocarditis if clinically indicated. FINDINGS  Left Ventricle: Left ventricular ejection fraction, by estimation, is 60 to 65%. The left ventricle has normal function. The left ventricle has no regional wall motion abnormalities. The left ventricular internal cavity size was normal in size. There is  no left ventricular hypertrophy. Left ventricular diastolic parameters were normal. Right Ventricle: The right ventricular size is normal. No increase in right ventricular wall thickness. Right ventricular systolic function is normal. Left Atrium: Left atrial size was normal in size. Right Atrium: Right atrial size was normal in size. Pericardium: There is no evidence of pericardial effusion. Mitral Valve: The mitral valve is normal in structure. No evidence of mitral valve regurgitation. No  evidence of mitral valve stenosis. Tricuspid Valve: The tricuspid valve is normal in structure. Tricuspid valve regurgitation is not demonstrated. No evidence of tricuspid stenosis. Aortic Valve: The aortic valve is tricuspid. Aortic valve regurgitation is not visualized. No aortic stenosis is present. Aortic valve peak gradient measures 3.5 mmHg. Pulmonic Valve: The pulmonic valve was normal in structure. Pulmonic valve regurgitation is not visualized. No evidence of pulmonic stenosis. Aorta: The aortic root is normal in size and structure. Venous: The inferior vena cava is normal in size with greater than 50% respiratory variability, suggesting right atrial pressure of 3 mmHg. IAS/Shunts: No atrial level shunt detected by color flow Doppler.  LEFT VENTRICLE PLAX 2D LVIDd:         4.60 cm   Diastology LVIDs:         3.40 cm   LV e' medial:    6.22 cm/s LV PW:         1.00 cm   LV E/e' medial:  10.4 LV IVS:        1.00 cm   LV e' lateral:   12.00 cm/s LVOT diam:     2.20 cm   LV E/e' lateral: 5.4 LV SV:         46 LV SV Index:   26 LVOT Area:     3.80 cm  RIGHT VENTRICLE             IVC RV S prime:     19.30 cm/s  IVC diam: 1.10 cm TAPSE (M-mode): 2.4 cm LEFT ATRIUM             Index        RIGHT ATRIUM           Index LA diam:        3.00 cm 1.67 cm/m   RA Area:     13.40 cm LA Vol (A2C):   41.0 ml 22.89 ml/m  RA Volume:   25.60 ml  14.29 ml/m LA Vol (A4C):   69.7 ml 38.91 ml/m LA Biplane Vol: 56.9 ml 31.77 ml/m  AORTIC VALVE AV Area (Vmax): 2.54 cm AV Vmax:        93.10 cm/s  AV Peak Grad:   3.5 mmHg LVOT Vmax:      62.10 cm/s LVOT Vmean:     38.633 cm/s LVOT VTI:       0.121 m  AORTA Ao Root diam: 3.80 cm Ao Asc diam:  3.70 cm MITRAL VALVE               TRICUSPID VALVE MV Area (PHT): 3.91 cm    TR Peak grad:   9.2 mmHg MV Decel Time: 194 msec    TR Vmax:        152.00 cm/s MV E velocity: 64.50 cm/s MV A velocity: 52.90 cm/s  SHUNTS MV E/A ratio:  1.22        Systemic VTI:  0.12 m                             Systemic Diam: 2.20 cm Rachelle Hora Croitoru MD Electronically signed by Thurmon Fair MD Signature Date/Time: 10/03/2022/12:22:18 PM    Final    MR Lumbar Spine W Wo Contrast  Result Date: 10/02/2022 CLINICAL DATA:  Low back pain.  Chronic pain syndrome. EXAM: MRI LUMBAR SPINE WITHOUT AND WITH CONTRAST TECHNIQUE: Multiplanar and multiecho pulse sequences of the lumbar spine were obtained without and with intravenous contrast. CONTRAST:  7mL GADAVIST GADOBUTROL 1 MMOL/ML IV SOLN COMPARISON:  CT 09/23/2022.  MRI 06/03/2020. FINDINGS: Segmentation: 5 lumbar type vertebral bodies as numbered previously. Alignment:  Normal Vertebrae: No significant finding at L3 or above. There has been previous posterior decompression, and fusion from L4 to the sacrum with posterior rods and pedicle screws. Conus medullaris and cauda equina: Conus extends to the T12 level. Conus and cauda equina appear normal. Paraspinal and other soft tissues: In comparing the MRI to the recent CT, there appears to be diminishing postoperative change within the posterior soft tissues. I think the amount of fluid, swelling and air bubbles is diminishing. I do not see a finding to suggest presence of an epidural abscess or osteomyelitis. Disc levels: No significant finding at L3-4 or above. Minimal desiccation and bulging but no stenosis or neural compression. L4 to sacrum: Previous posterior decompression and fusion. As above, there is an improving appearance of the posterior soft tissues when compared to the recent CT scan. Overall, the findings seem as expected in the postoperative time frame. No evidence of intraspinal or epidural collection. IMPRESSION: 1. Previous posterior decompression and fusion from L4 to the sacrum. Improving postoperative changes in the posterior soft tissues. I think the findings are as expected in the postoperative time frame. I do not see a finding to suggest the presence of an epidural abscess or osteomyelitis. 2. No  significant finding at L3 or above. Electronically Signed   By: Paulina Fusi M.D.   On: 10/02/2022 13:49   DG Chest Port 1 View  Result Date: 10/02/2022 CLINICAL DATA:  Sepsis. EXAM: PORTABLE CHEST 1 VIEW COMPARISON:  September 23, 2022. FINDINGS: The heart size and mediastinal contours are within normal limits. Both lungs are clear. The visualized skeletal structures are unremarkable. IMPRESSION: No active disease. Electronically Signed   By: Lupita Raider M.D.   On: 10/02/2022 12:33   CT Lumbar Spine Wo Contrast  Result Date: 09/23/2022 CLINICAL DATA:  Spine surgery/procedure, postop, infection suspected EXAM: CT LUMBAR SPINE WITHOUT CONTRAST TECHNIQUE: Multidetector CT imaging of the lumbar spine was performed without intravenous contrast administration. Multiplanar CT image reconstructions were also generated. RADIATION DOSE REDUCTION: This  exam was performed according to the departmental dose-optimization program which includes automated exposure control, adjustment of the mA and/or kV according to patient size and/or use of iterative reconstruction technique. COMPARISON:  MRI 06/03/2020, x-ray 09/13/2020 FINDINGS: Segmentation: 5 lumbar type vertebrae. Alignment: Minimal grade 1 anterolisthesis of L5 on S1. Vertebrae: Interval fusion of the L4-S1 levels with transpedicular screw and rod fixation. Posterior decompression and bone graft material is present at these levels. Bone loss of the tip of the L5 spinous process could be postsurgical or related to new erosive change (series 5, image 34; series 3, image 96). There also appears to be bone loss along the inferior margin of the L4 spinous process. Hardware appears intact. Endplate sclerosis associated with the L5-S1 level is chronic related to degenerative disc disease. Paraspinal and other soft tissues: Fluid and gas containing collection within the posterior paraspinal soft tissues of the lower lumbar spine measuring approximately 4.3 x 4.3 cm  transaxially and extends craniocaudal length of approximately 6 cm (series 4, image 95). Exact dimensions are difficult given lack of IV contrast and associated metallic streak artifact from patient's hardware. Aortoiliac atherosclerosis with prior stent grafting. Sigmoid diverticulosis. Disc levels: Advanced disc height loss at L5-S1. Mild L4-5 disc height loss. Non fused disc heights are maintained. IMPRESSION: 1. Interval fusion and posterior decompression of the L4-S1 levels. 2. Fluid and gas containing collection within the posterior paraspinal soft tissues of the lower lumbar spine measuring approximately 4.3 x 4.3 x 6.0 cm. Exact dimensions are difficult given lack of IV contrast and associated metallic streak artifact from patient's hardware. An infected postoperative fluid collection is the primary concern. Neurosurgical assessment is recommended. 3. Bone loss of the L4 and L5 spinous processes could be postsurgical or related to new erosive change/osteomyelitis. 4. Aortic atherosclerosis (ICD10-I70.0). Electronically Signed   By: Duanne Guess D.O.   On: 09/23/2022 15:23   DG Chest Portable 1 View  Result Date: 09/23/2022 CLINICAL DATA:  Cough. EXAM: PORTABLE CHEST 1 VIEW COMPARISON:  CT 04/21/2022 FINDINGS: Heart size is normal. No pleural effusion or edema. No airspace opacities identified. Chronic coarsened interstitial markings identified. Visualized osseous structures are notable for prior ACDF of the lower cervical spine. Mild thoracic scoliosis is convex towards the right. IMPRESSION: 1. No acute cardiopulmonary abnormalities. 2. Chronic interstitial coarsening which may reflect underlying COPD/emphysema. Electronically Signed   By: Signa Kell M.D.   On: 09/23/2022 11:40   (Echo, Carotid, EGD, Colonoscopy, ERCP)    Subjective: Patient seen and examined.  Had some drainage from the back but no complaints.  Family was very interested in going home so they left in the evening after PICC  line placed and home infusion therapy arranged.   Discharge Exam: Vitals:   10/05/22 0629 10/05/22 0751  BP: 137/81 (!) 148/90  Pulse: 67 69  Resp: 16 20  Temp: 97.8 F (36.6 C) 98 F (36.7 C)  SpO2: 96% 95%   Vitals:   10/04/22 1630 10/04/22 2024 10/05/22 0629 10/05/22 0751  BP: 130/80 (!) 148/85 137/81 (!) 148/90  Pulse: 78 74 67 69  Resp: 18 15 16 20   Temp: 98 F (36.7 C) 98.2 F (36.8 C) 97.8 F (36.6 C) 98 F (36.7 C)  TempSrc:  Oral Oral   SpO2: 96% 98% 96% 95%  Weight:      Height:        General: Pt is alert, awake, not in acute distress Cardiovascular: RRR, S1/S2 +, no rubs, no gallops Respiratory: CTA  bilaterally, no wheezing, no rhonchi Abdominal: Soft, NT, ND, bowel sounds + Extremities: no edema, no cyanosis Patient has a small gaping wound on the lower spine, draining serous fluid.  No fluctuation, no palpable underlying collection.  No erythema.    The results of significant diagnostics from this hospitalization (including imaging, microbiology, ancillary and laboratory) are listed below for reference.     Microbiology: Recent Results (from the past 240 hour(s))  Blood Culture (routine x 2)     Status: None (Preliminary result)   Collection Time: 10/02/22 12:18 PM   Specimen: Right Antecubital; Blood  Result Value Ref Range Status   Specimen Description   Final    RIGHT ANTECUBITAL BOTTLES DRAWN AEROBIC AND ANAEROBIC   Special Requests   Final    Blood Culture results may not be optimal due to an excessive volume of blood received in culture bottles   Culture   Final    NO GROWTH 4 DAYS Performed at [facility], [address], [place], [place] [address]    Report Status PENDING  Incomplete  Blood Culture (routine x 2)     Status: None (Preliminary result)   Collection Time: 10/02/22 12:34 PM   Specimen: BLOOD LEFT ARM  Result Value Ref Range Status   Specimen Description BLOOD LEFT ARM BOTTLES DRAWN AEROBIC AND ANAEROBIC  Final   Special  Requests Blood Culture adequate volume  Final   Culture   Final    NO GROWTH 4 DAYS Performed at [facility], [address], [place], [place] [address]    Report Status PENDING  Incomplete     Labs: BNP (last 3 results) No results for input(s): "BNP" in the last 8760 hours. Basic Metabolic Panel: Recent Labs  Lab 10/02/22 1218 10/02/22 1229 10/03/22 0452 10/05/22 0451  NA 136 139 136 140  K 3.4* 3.5 3.3* 4.1  CL 101 101 104 107  CO2 25  --  23 24  GLUCOSE 135* 131* 94 96  BUN 6* 4* <5* <5*  CREATININE 0.69 0.60* 0.63 0.75  CALCIUM 9.4  --  8.6* 8.9  MG  --   --  1.6* 1.9  PHOS  --   --   --  4.2   Liver Function Tests: Recent Labs  Lab 10/02/22 1218 10/05/22 0451  AST 18 13*  ALT 17 10  ALKPHOS 60 45  BILITOT 0.5 0.5  PROT 9.3* 6.8  ALBUMIN 3.5 2.6*   No results for input(s): "LIPASE", "AMYLASE" in the last 168 hours. No results for input(s): "AMMONIA" in the last 168 hours. CBC: Recent Labs  Lab 10/02/22 1218 10/02/22 1229 10/03/22 0452 10/05/22 0451  WBC 8.0  --  8.6 7.8  NEUTROABS 4.2  --   --  3.5  HGB 15.4 17.0 11.8* 12.5*  HCT 46.6 50.0 35.7* 37.0*  MCV 93.0  --  93.2 91.1  PLT 425*  --  348 357   Cardiac Enzymes: No results for input(s): "CKTOTAL", "CKMB", "CKMBINDEX", "TROPONINI" in the last 168 hours. BNP: Invalid input(s): "POCBNP" CBG: No results for input(s): "GLUCAP" in the last 168 hours. D-Dimer No results for input(s): "DDIMER" in the last 72 hours. Hgb A1c No results for input(s): "HGBA1C" in the last 72 hours. Lipid Profile No results for input(s): "CHOL", "HDL", "LDLCALC", "TRIG", "CHOLHDL", "LDLDIRECT" in the last 72 hours. Thyroid function studies No results for input(s): "TSH", "T4TOTAL", "T3FREE", "THYROIDAB" in the last 72 hours.  Invalid input(s): "FREET3" Anemia work up No results for input(s): "  VITAMINB12", "FOLATE", "FERRITIN", "TIBC", "IRON", "RETICCTPCT" in the last 72 hours. Urinalysis    Component Value  Date/Time   COLORURINE AMBER (A) 10/02/2022 1247   APPEARANCEUR HAZY (A) 10/02/2022 1247   LABSPEC 1.026 10/02/2022 1247   PHURINE 5.0 10/02/2022 1247   GLUCOSEU NEGATIVE 10/02/2022 1247   HGBUR SMALL (A) 10/02/2022 1247   BILIRUBINUR NEGATIVE 10/02/2022 1247   KETONESUR NEGATIVE 10/02/2022 1247   PROTEINUR 30 (A) 10/02/2022 1247   NITRITE NEGATIVE 10/02/2022 1247   LEUKOCYTESUR NEGATIVE 10/02/2022 1247   Sepsis Labs Recent Labs  Lab 10/02/22 1218 10/03/22 0452 10/05/22 0451  WBC 8.0 8.6 7.8   Microbiology Recent Results (from the past 240 hour(s))  Blood Culture (routine x 2)     Status: None (Preliminary result)   Collection Time: 10/02/22 12:18 PM   Specimen: Right Antecubital; Blood  Result Value Ref Range Status   Specimen Description   Final    RIGHT ANTECUBITAL BOTTLES DRAWN AEROBIC AND ANAEROBIC   Special Requests   Final    Blood Culture results may not be optimal due to an excessive volume of blood received in culture bottles   Culture   Final    NO GROWTH 4 DAYS Performed at [facility], [address] [place], [place] [address]    Report Status PENDING  Incomplete  Blood Culture (routine x 2)     Status: None (Preliminary result)   Collection Time: 10/02/22 12:34 PM   Specimen: BLOOD LEFT ARM  Result Value Ref Range Status   Specimen Description BLOOD LEFT ARM BOTTLES DRAWN AEROBIC AND ANAEROBIC  Final   Special Requests Blood Culture adequate volume  Final   Culture   Final    NO GROWTH 4 DAYS Performed at [facility], [address], [place], [place] [address]    Report Status PENDING  Incomplete     Time coordinating discharge: 35 minutes  SIGNED:   Dorcas Carrow, MD  [facility] 10/06/2022, 2:59 PM

## 2022-10-07 LAB — CULTURE, BLOOD (ROUTINE X 2)

## 2022-10-09 ENCOUNTER — Telehealth: Payer: Self-pay

## 2022-10-09 NOTE — Telephone Encounter (Signed)
Patients wife called very anxious about several issues. Patient is on IV abx and will not be seen until 11/07/22 for [facility] FU. Patient and wife confused on who will decide when to go back on Humira that was stopped while admitted. Advised that would be up to Rheumatology as discharge summary and notes state to HOLD until follow up. Wife upset stating that makes no sense and also reports multiple issues with IV abx and HH. Requesting OV sooner. Discussed with Rexene Alberts who approved OV for 10/11/22 at 2 pm and did verify Humira decision would be up to Rheum. Party hung up satisfied and calmed down by end of 21 min call.

## 2022-10-11 ENCOUNTER — Ambulatory Visit (INDEPENDENT_AMBULATORY_CARE_PROVIDER_SITE_OTHER): Payer: BC Managed Care – PPO | Admitting: Infectious Diseases

## 2022-10-11 ENCOUNTER — Other Ambulatory Visit: Payer: Self-pay

## 2022-10-11 VITALS — BP 158/82 | HR 87 | Temp 97.6°F | Wt 144.0 lb

## 2022-10-11 DIAGNOSIS — M0579 Rheumatoid arthritis with rheumatoid factor of multiple sites without organ or systems involvement: Secondary | ICD-10-CM

## 2022-10-11 DIAGNOSIS — I10 Essential (primary) hypertension: Secondary | ICD-10-CM | POA: Diagnosis not present

## 2022-10-11 DIAGNOSIS — B9561 Methicillin susceptible Staphylococcus aureus infection as the cause of diseases classified elsewhere: Secondary | ICD-10-CM

## 2022-10-11 DIAGNOSIS — R7881 Bacteremia: Secondary | ICD-10-CM

## 2022-10-11 DIAGNOSIS — T8149XA Infection following a procedure, other surgical site, initial encounter: Secondary | ICD-10-CM

## 2022-10-11 DIAGNOSIS — Z452 Encounter for adjustment and management of vascular access device: Secondary | ICD-10-CM | POA: Diagnosis not present

## 2022-10-11 NOTE — Progress Notes (Unsigned)
Patient: Micheal Serrano  DOB: Sep 27, 1955 MRN: [id number] PCP: Roe Rutherford, NP     Subjective:  Micheal Serrano is a 67 y.o. male recently admitted to [facility] with MSSA bacteremia secondary to presumed vertebral infection following recent microdiscectomy at L5-S1 on 08/28/2022.   He was seen in the ER on 4/13 after he began having bloody then purulent drainage from surgical incision and new lower back pain. During this encounter a CT scan revealed fluid collection with gas measuring 4.3 x 4.3 cm around the L5-S1 level, hard to visualize w/o contrast and hardware streak in place.  At this time blood cultures were collected. He was prescribed Doxycycline and Cipro for his drainage and discharged from the ER prior to blood culture growth. He was called to report back to the hospital for evaluation, which was delayed until 10/02/22. He reported improvement with the oral antibiotics and nearly resolved drainage from spine incision. He completed his doxycycline + cipro a day or so before re-presenting to the ER. Blood cultures were re-collected at that time and did not re-grow (after recent course of active antibioitcs). TTE was negative for any suspicion of valve infection and was essentially pristine - TEE was deferred given it would not make any impact on care recommendations.  PICC was placed and he was discharged home on cefazolin 2 gm IV TID through 11/27/2022  (8 Micheal Serrano). He was instructed to follow up with his neurosurgeon and rheumatology. It was recommended he hold Humira injections during active treatment of this infection.   Since discharge home they have had multiple concerns with PICC line and plan for treatment and requested an appointment to go over the plan and ensure questions were answered to their satisfaction. Lots of questions about blood pressure medications - wants   Saw Dr. Lorenso Courier yesterday with nsgy - feel like he has cleared up a lot of questions they had. Micheal Serrano still  has some dull back pain around the incision and an open pinhole incision that is draining some thin yellow fluid. At this time it happens everyday.    Review of Systems  Constitutional:  Negative for chills and fever.  Respiratory: Negative.    Cardiovascular:  Negative for chest pain.  Gastrointestinal: Negative.   Genitourinary: Negative.   Musculoskeletal:  Positive for back pain.  Skin:  Negative for rash.       Ongoing drainage from spine incision  Neurological:  Positive for focal weakness (left foot). Negative for dizziness and headaches.     Past Medical History:  Diagnosis Date   Alcohol abuse    GERD (gastroesophageal reflux disease)    Hepatitis C    states this has been treated and is clear   Hypertension    Multiple subsegmental pulmonary emboli without acute cor pulmonale (HCC) 09/14/2020   Neck pain    Polycythemia, secondary 05/03/2021    Outpatient Medications Prior to Visit  Medication Sig Dispense Refill   acetaminophen (TYLENOL) 325 MG tablet Take 650 mg by mouth every 6 (six) hours as needed for mild pain.     apixaban (ELIQUIS) 5 MG TABS tablet Take 1 tablet (5 mg total) by mouth 2 (two) times daily. 180 tablet 1   atorvastatin (LIPITOR) 20 MG tablet Take 20 mg by mouth daily.     folic acid (FOLVITE) 1 MG tablet Take 1 mg by mouth daily.     gabapentin (NEURONTIN) 600 MG tablet Take 1,200 mg by mouth 2 (two) times daily.  HYDROcodone-acetaminophen (NORCO) 10-325 MG tablet Take 1 tablet by mouth every 6 (six) hours as needed for moderate pain.     No facility-administered medications prior to visit.     No Known Allergies  Social History   Tobacco Use   Smoking status: Former    Packs/day: 1.00    Years: 25.00    Additional pack years: 0.00    Total pack years: 25.00    Types: Cigarettes    Quit date: 09/13/2020    Years since quitting: 2.0   Smokeless tobacco: Never   Tobacco comments:    1 pack a day x  x 30 yrs  Vaping Use   Vaping  Use: Never used  Substance Use Topics   Alcohol use: Yes    Alcohol/week: 14.0 standard drinks of alcohol    Types: 14 Cans of beer per week    Comment: 2-3 beers a day   Drug use: No    Family History  Problem Relation Age of Onset   Breast cancer Mother    Thyroid disease Mother    Stroke Mother    Dementia Father    Colon cancer Neg Hx     Objective:   Vitals:   10/11/22 1408 10/11/22 1447  BP: (!) 159/73 (!) 158/82  Pulse: 87   Temp: 97.6 F (36.4 C)   TempSrc: Temporal   SpO2: 97%   Weight: 144 lb (65.3 kg)    Body mass index is 23.24 kg/m.  Physical Exam Vitals reviewed.  Constitutional:      Appearance: Normal appearance. He is not ill-appearing.  HENT:     Mouth/Throat:     Mouth: Mucous membranes are moist.     Pharynx: Oropharynx is clear.  Eyes:     Pupils: Pupils are equal, round, and reactive to light.  Cardiovascular:     Rate and Rhythm: Normal rate and regular rhythm.  Pulmonary:     Effort: Pulmonary effort is normal.     Breath sounds: Normal breath sounds.  Abdominal:     General: There is no distension.     Tenderness: There is no abdominal tenderness.  Musculoskeletal:        General: Normal range of motion.  Skin:    General: Skin is warm and dry.     Findings: No erythema or rash.     Comments: Surgical incision mostly well healed though has a remaining pinhole drainage at the top of incision.   Neurological:     Mental Status: He is alert.     Lab Results: Lab Results  Component Value Date   WBC 7.8 10/05/2022   HGB 12.5 (L) 10/05/2022   HCT 37.0 (L) 10/05/2022   MCV 91.1 10/05/2022   PLT 357 10/05/2022    Lab Results  Component Value Date   CREATININE 0.75 10/05/2022   BUN <5 (L) 10/05/2022   NA 140 10/05/2022   K 4.1 10/05/2022   CL 107 10/05/2022   CO2 24 10/05/2022    Lab Results  Component Value Date   ALT 10 10/05/2022   AST 13 (L) 10/05/2022   ALKPHOS 45 10/05/2022   BILITOT 0.5 10/05/2022      Assessment & Plan:   Problem List Items Addressed This Visit       Unprioritized   HTN (hypertension) (Chronic)    I would like Micheal Serrano to discuss his blood pressure management with his PCP who is more familiar with his history, previous tolerance and general recommendations  here. They will contact Micheal Serrano for follow up. His BP today was a bit elevated.   BP Readings from Last 3 Encounters:  10/11/22 (!) 158/82  10/05/22 (!) 148/90  09/23/22 134/83        Seropositive rheumatoid arthritis of multiple sites (HCC)    Would hold on Humira during treatment with IV antibiotics (planned 8 week course). I will message his rheumatology team to let them know of his infection and these recommendations.  Restart TBD at this point - but I do worry his hardware will be an ongoing nidus for infection and keeping him on this may contribute to a difficult time treating this infection.       Bacteremia due to methicillin susceptible Staphylococcus aureus (MSSA) - Primary    Secondary to surgical site infection of lumbar spine complicated by hardware.   We reviewed course of his history of bacteremia from 4/13 ER visit. The PO Cipro he took as recommended by his surgeon cleared his blood as he was not bacteremic on 4/22 presentation. He has no concerning symptoms for relapsing bacteremia at the time of our visit.       PICC (peripherally inserted central catheter) in place    Site unremarkable, well maintained and functioning as expected. Continue care and maintenance until planned end of IV antibiotics. Home Health team to remove at completion.        Surgical site infection, Lumbar Spine    On exam he has a pinhole opening at the top part of his incision. I asked Micheal Serrano and his wife to please keep a very close eye on this. I would expect that this heal and seal up on antibiotics if the hardware is not infected. If we have an ongoing sinus tract, there will need to be further discussion about  formal debridement and possibly hardware removal if that is possible to cure this infection for him.   Will continue with close follow up - will send a message to his nsgy team as well.        Total Encounter Time: 45 minutes including 30 minutes of direct face to face time spent in discussion and review over hospitalization, treatment plan, PICC line care, as well as 15 min review time in chart with recent hospital stay and course.   Micheal Alberts, MSN, NP-C [facility] for Infectious Disease [facility] Pager: [phone] Office: [phone]  10/12/22  3:41 PM

## 2022-10-11 NOTE — Patient Instructions (Addendum)
Magnesium Citrate - 200 or 250 mg capsule once a day is a very safe thing to take at night to keep the magnesium levels up  I want you to talk with your primary care provider about the recommendations for the blood pressure management. Your blood pressure today was a little elevated.   Ideally over the next 6 Repinski I want to see that draining hole in the incision heal up and stop draining. If it does not, we need to talk with Dr. Lorenso Courier about that. Surgery may be needed if this does not heal and seal up.   We will plan to keep your appointment in May 28th.

## 2022-10-12 ENCOUNTER — Encounter (HOSPITAL_COMMUNITY): Payer: Self-pay | Admitting: Hematology

## 2022-10-12 DIAGNOSIS — T8149XA Infection following a procedure, other surgical site, initial encounter: Secondary | ICD-10-CM | POA: Insufficient documentation

## 2022-10-12 DIAGNOSIS — M4636 Infection of intervertebral disc (pyogenic), lumbar region: Secondary | ICD-10-CM | POA: Diagnosis not present

## 2022-10-12 NOTE — Assessment & Plan Note (Signed)
Secondary to surgical site infection of lumbar spine complicated by hardware.   We reviewed course of his history of bacteremia from 4/13 ER visit. The PO Cipro he took as recommended by his surgeon cleared his blood as he was not bacteremic on 4/22 presentation. He has no concerning symptoms for relapsing bacteremia at the time of our visit.

## 2022-10-12 NOTE — Assessment & Plan Note (Signed)
I would like Micheal Serrano to discuss his blood pressure management with his PCP who is more familiar with his history, previous tolerance and general recommendations here. They will contact Courtney for follow up. His BP today was a bit elevated.   BP Readings from Last 3 Encounters:  10/11/22 (!) 158/82  10/05/22 (!) 148/90  09/23/22 134/83

## 2022-10-12 NOTE — Assessment & Plan Note (Signed)
Would hold on Humira during treatment with IV antibiotics (planned 8 week course). I will message his rheumatology team to let them know of his infection and these recommendations.  Restart TBD at this point - but I do worry his hardware will be an ongoing nidus for infection and keeping him on this may contribute to a difficult time treating this infection.

## 2022-10-12 NOTE — Assessment & Plan Note (Signed)
On exam he has a pinhole opening at the top part of his incision. I asked Orvilla Fus and his wife to please keep a very close eye on this. I would expect that this heal and seal up on antibiotics if the hardware is not infected. If we have an ongoing sinus tract, there will need to be further discussion about formal debridement and possibly hardware removal if that is possible to cure this infection for him.   Will continue with close follow up - will send a message to his nsgy team as well.

## 2022-10-12 NOTE — Assessment & Plan Note (Signed)
Site unremarkable, well maintained and functioning as expected. Continue care and maintenance until planned end of IV antibiotics. Home Health team to remove at completion.   

## 2022-10-16 DIAGNOSIS — I10 Essential (primary) hypertension: Secondary | ICD-10-CM | POA: Diagnosis not present

## 2022-10-16 DIAGNOSIS — B9561 Methicillin susceptible Staphylococcus aureus infection as the cause of diseases classified elsewhere: Secondary | ICD-10-CM | POA: Diagnosis not present

## 2022-10-16 DIAGNOSIS — R7881 Bacteremia: Secondary | ICD-10-CM | POA: Diagnosis not present

## 2022-10-19 DIAGNOSIS — M4636 Infection of intervertebral disc (pyogenic), lumbar region: Secondary | ICD-10-CM | POA: Diagnosis not present

## 2022-10-24 DIAGNOSIS — M79672 Pain in left foot: Secondary | ICD-10-CM | POA: Diagnosis not present

## 2022-10-24 DIAGNOSIS — I10 Essential (primary) hypertension: Secondary | ICD-10-CM | POA: Diagnosis not present

## 2022-10-25 DIAGNOSIS — M4636 Infection of intervertebral disc (pyogenic), lumbar region: Secondary | ICD-10-CM | POA: Diagnosis not present

## 2022-10-26 LAB — LAB REPORT - SCANNED: EGFR (Non-African Amer.): 102

## 2022-10-28 ENCOUNTER — Other Ambulatory Visit: Payer: Self-pay | Admitting: Adult Health Nurse Practitioner

## 2022-10-28 DIAGNOSIS — M86172 Other acute osteomyelitis, left ankle and foot: Secondary | ICD-10-CM

## 2022-10-30 DIAGNOSIS — M86172 Other acute osteomyelitis, left ankle and foot: Secondary | ICD-10-CM | POA: Diagnosis not present

## 2022-10-30 DIAGNOSIS — M722 Plantar fascial fibromatosis: Secondary | ICD-10-CM | POA: Diagnosis not present

## 2022-10-30 DIAGNOSIS — R600 Localized edema: Secondary | ICD-10-CM | POA: Diagnosis not present

## 2022-10-30 DIAGNOSIS — M19072 Primary osteoarthritis, left ankle and foot: Secondary | ICD-10-CM | POA: Diagnosis not present

## 2022-11-01 DIAGNOSIS — M4636 Infection of intervertebral disc (pyogenic), lumbar region: Secondary | ICD-10-CM | POA: Diagnosis not present

## 2022-11-03 LAB — LAB REPORT - SCANNED: EGFR: 107

## 2022-11-07 ENCOUNTER — Ambulatory Visit (INDEPENDENT_AMBULATORY_CARE_PROVIDER_SITE_OTHER): Payer: BC Managed Care – PPO | Admitting: Infectious Diseases

## 2022-11-07 ENCOUNTER — Other Ambulatory Visit: Payer: Self-pay

## 2022-11-07 ENCOUNTER — Telehealth: Payer: Self-pay

## 2022-11-07 VITALS — BP 131/81 | HR 74 | Temp 97.3°F | Wt 143.0 lb

## 2022-11-07 DIAGNOSIS — R7881 Bacteremia: Secondary | ICD-10-CM

## 2022-11-07 DIAGNOSIS — T8149XA Infection following a procedure, other surgical site, initial encounter: Secondary | ICD-10-CM | POA: Diagnosis not present

## 2022-11-07 DIAGNOSIS — M0579 Rheumatoid arthritis with rheumatoid factor of multiple sites without organ or systems involvement: Secondary | ICD-10-CM

## 2022-11-07 DIAGNOSIS — Z452 Encounter for adjustment and management of vascular access device: Secondary | ICD-10-CM

## 2022-11-07 DIAGNOSIS — B9561 Methicillin susceptible Staphylococcus aureus infection as the cause of diseases classified elsewhere: Secondary | ICD-10-CM

## 2022-11-07 MED ORDER — CEFADROXIL 500 MG PO CAPS: 1000.0000 mg | ORAL_CAPSULE | Freq: Two times a day (BID) | ORAL | 11 refills | Status: DC

## 2022-11-07 NOTE — Patient Instructions (Addendum)
Will call your home health team to get your PICC line out Friday.   START the pills (2 pills twice a day) on Friday 5/31 - continue this until you come back to see me in 3 months

## 2022-11-07 NOTE — Progress Notes (Unsigned)
Patient: Micheal Serrano  DOB: 03-25-1956 MRN: [id number] PCP: Roe Rutherford, NP     Subjective:  Micheal Serrano is a 67 y.o. male recently admitted to [facility] with MSSA bacteremia secondary to presumed vertebral infection following recent microdiscectomy at L5-S1 on 08/28/2022.   He was seen in the ER on 4/13 after he began having bloody then purulent drainage from surgical incision and new lower back pain. During this encounter a CT scan revealed fluid collection with gas measuring 4.3 x 4.3 cm around the L5-S1 level, hard to visualize w/o contrast and hardware streak in place.  At this time blood cultures were collected. He was prescribed Doxycycline and Cipro for his drainage and discharged from the ER prior to blood culture growth. He was called to report back to the hospital for evaluation, which was delayed until 10/02/22. He reported improvement with the oral antibiotics and nearly resolved drainage from spine incision. He completed his doxycycline + cipro a day or so before re-presenting to the ER. Blood cultures were re-collected at that time and did not re-grow (after recent course of active antibioitcs). TTE was negative for any suspicion of valve infection and was essentially pristine - TEE was deferred given it would not make any impact on care recommendations.  PICC was placed and he was discharged home on cefazolin 2 gm IV TID through 11/27/2022  (8 Rao). He was instructed to follow up with his neurosurgeon and rheumatology. It was recommended he hold Humira injections during active treatment of this infection.    His back has stopped leaking fluid over a week ago. There are a few scabbed areas that are in the mid incision that flake off and appear to have a little pin hole there but no drainage.  He has seen Dr. Lorenso Courier (nsgy) after I have seen him. They have an appointment set up with Dr. Maurice Small.    Review of Systems  Constitutional:  Negative for chills and  fever.  Respiratory: Negative.    Cardiovascular:  Negative for chest pain.  Gastrointestinal: Negative.   Genitourinary: Negative.   Musculoskeletal:  Positive for back pain.  Skin:  Negative for rash.       Ongoing drainage from spine incision  Neurological:  Positive for focal weakness (left foot). Negative for dizziness and headaches.     Past Medical History:  Diagnosis Date   Alcohol abuse    GERD (gastroesophageal reflux disease)    Hepatitis C    states this has been treated and is clear   Hypertension    Multiple subsegmental pulmonary emboli without acute cor pulmonale (HCC) 09/14/2020   Neck pain    Polycythemia, secondary 05/03/2021    Outpatient Medications Prior to Visit  Medication Sig Dispense Refill   acetaminophen (TYLENOL) 325 MG tablet Take 650 mg by mouth every 6 (six) hours as needed for mild pain.     apixaban (ELIQUIS) 5 MG TABS tablet Take 1 tablet (5 mg total) by mouth 2 (two) times daily. 180 tablet 1   atorvastatin (LIPITOR) 20 MG tablet Take 20 mg by mouth daily.     folic acid (FOLVITE) 1 MG tablet Take 1 mg by mouth daily.     gabapentin (NEURONTIN) 600 MG tablet Take 1,200 mg by mouth 2 (two) times daily.     No facility-administered medications prior to visit.     No Known Allergies  Social History   Tobacco Use   Smoking status: Former  Packs/day: 1.00    Years: 25.00    Additional pack years: 0.00    Total pack years: 25.00    Types: Cigarettes    Quit date: 09/13/2020    Years since quitting: 2.1   Smokeless tobacco: Never   Tobacco comments:    1 pack a day x  x 30 yrs  Vaping Use   Vaping Use: Never used  Substance Use Topics   Alcohol use: Yes    Alcohol/week: 14.0 standard drinks of alcohol    Types: 14 Cans of beer per week    Comment: 2-3 beers a day   Drug use: No    Family History  Problem Relation Age of Onset   Breast cancer Mother    Thyroid disease Mother    Stroke Mother    Dementia Father    Colon  cancer Neg Hx     Objective:   There were no vitals filed for this visit.  There is no height or weight on file to calculate BMI.  Physical Exam Vitals reviewed.  Constitutional:      Appearance: Normal appearance. He is not ill-appearing.  HENT:     Mouth/Throat:     Mouth: Mucous membranes are moist.     Pharynx: Oropharynx is clear.  Eyes:     Pupils: Pupils are equal, round, and reactive to light.  Cardiovascular:     Rate and Rhythm: Normal rate and regular rhythm.  Pulmonary:     Effort: Pulmonary effort is normal.     Breath sounds: Normal breath sounds.  Abdominal:     General: There is no distension.     Tenderness: There is no abdominal tenderness.  Musculoskeletal:        General: Normal range of motion.  Skin:    General: Skin is warm and dry.     Findings: No erythema or rash.     Comments: Surgical incision mostly well healed though has a remaining pinhole drainage at the top of incision.   Neurological:     Mental Status: He is alert.     Lab Results: Lab Results  Component Value Date   WBC 7.8 10/05/2022   HGB 12.5 (L) 10/05/2022   HCT 37.0 (L) 10/05/2022   MCV 91.1 10/05/2022   PLT 357 10/05/2022    Lab Results  Component Value Date   CREATININE 0.75 10/05/2022   BUN <5 (L) 10/05/2022   NA 140 10/05/2022   K 4.1 10/05/2022   CL 107 10/05/2022   CO2 24 10/05/2022    Lab Results  Component Value Date   ALT 10 10/05/2022   AST 13 (L) 10/05/2022   ALKPHOS 45 10/05/2022   BILITOT 0.5 10/05/2022     Assessment & Plan:   Problem List Items Addressed This Visit   None  Total Encounter Time: 45 minutes including 30 minutes of direct face to face time spent in discussion and review over hospitalization, treatment plan, PICC line care, as well as 15 min review time in chart with recent hospital stay and course.   Rexene Alberts, MSN, NP-C [facility] for Infectious Disease [facility] Pager: [phone] Office:  [phone]  11/07/22  2:52 PM

## 2022-11-07 NOTE — Telephone Encounter (Signed)
Per Rexene Alberts, NP, okay to pull PICC on 11/10/22. Orders sent to Jeri Modena, RN with [facility].  Sandie Ano, RN

## 2022-11-08 NOTE — Assessment & Plan Note (Signed)
Will call home health team and have them remove Friday after he completes antibiotics he has at home.  Site looks good and functioning well.

## 2022-11-08 NOTE — Assessment & Plan Note (Signed)
He remains off Humira which I agree with. I would not be enthusiastic about putting him back on this for RA management given my concern over hardware involvement from previous fluid collection seen on 4/13 CT scan

## 2022-11-08 NOTE — Assessment & Plan Note (Signed)
Resolved and no symptoms concerning for relapsing bacteremia.

## 2022-11-09 NOTE — Assessment & Plan Note (Addendum)
Micheal Serrano continues to do well on IV cefazolin - his back incision looks good to me and appears dry w/o any signs of inflammation. His inflammatory markers have normalized. Will continue through 6 [place]ph[place] (Thursday) and have home health D/C PICC Friday. Will transition to oral cefadroxil 1 gm (2 500 mg tabs) BID for 3 months then have him return.   We discussed cure vs control in the setting of hardware infection (which I worry is the case given CT findings + purulent drainage form spine incision in the setting of MSSA bacteremia otherwise unexplained). The only way we can know for sure is to stop antibiotics and follow for changes in incision/back pain, etc. This would be a higher risk to him for operative return if it appears the hardware is infected. His goal is to avoid further OR trips at this point and may consider the ongoing suppressive antibiotic recommendation. We will discuss at return visit and await NSGY opinion as well -  He is awaiting a visit with Dr. Maurice Small and they plan to discuss these events with him as well.

## 2022-11-10 DIAGNOSIS — M5416 Radiculopathy, lumbar region: Secondary | ICD-10-CM | POA: Diagnosis not present

## 2022-11-15 LAB — LAB REPORT - SCANNED: EGFR: 100

## 2022-11-16 ENCOUNTER — Encounter: Payer: Self-pay | Admitting: Infectious Diseases

## 2022-11-21 ENCOUNTER — Encounter: Payer: Self-pay | Admitting: Infectious Diseases

## 2022-11-27 IMAGING — DX DG LUMBAR SPINE COMPLETE 4+V
4 series · 4 of 4 positions shown · non-contrast
Comparison: April 22, 2019

CLINICAL DATA: Localized lumbar pain after moving furniture.

EXAM:
LUMBAR SPINE - COMPLETE 4+ VIEW

[lumbar spine ap]
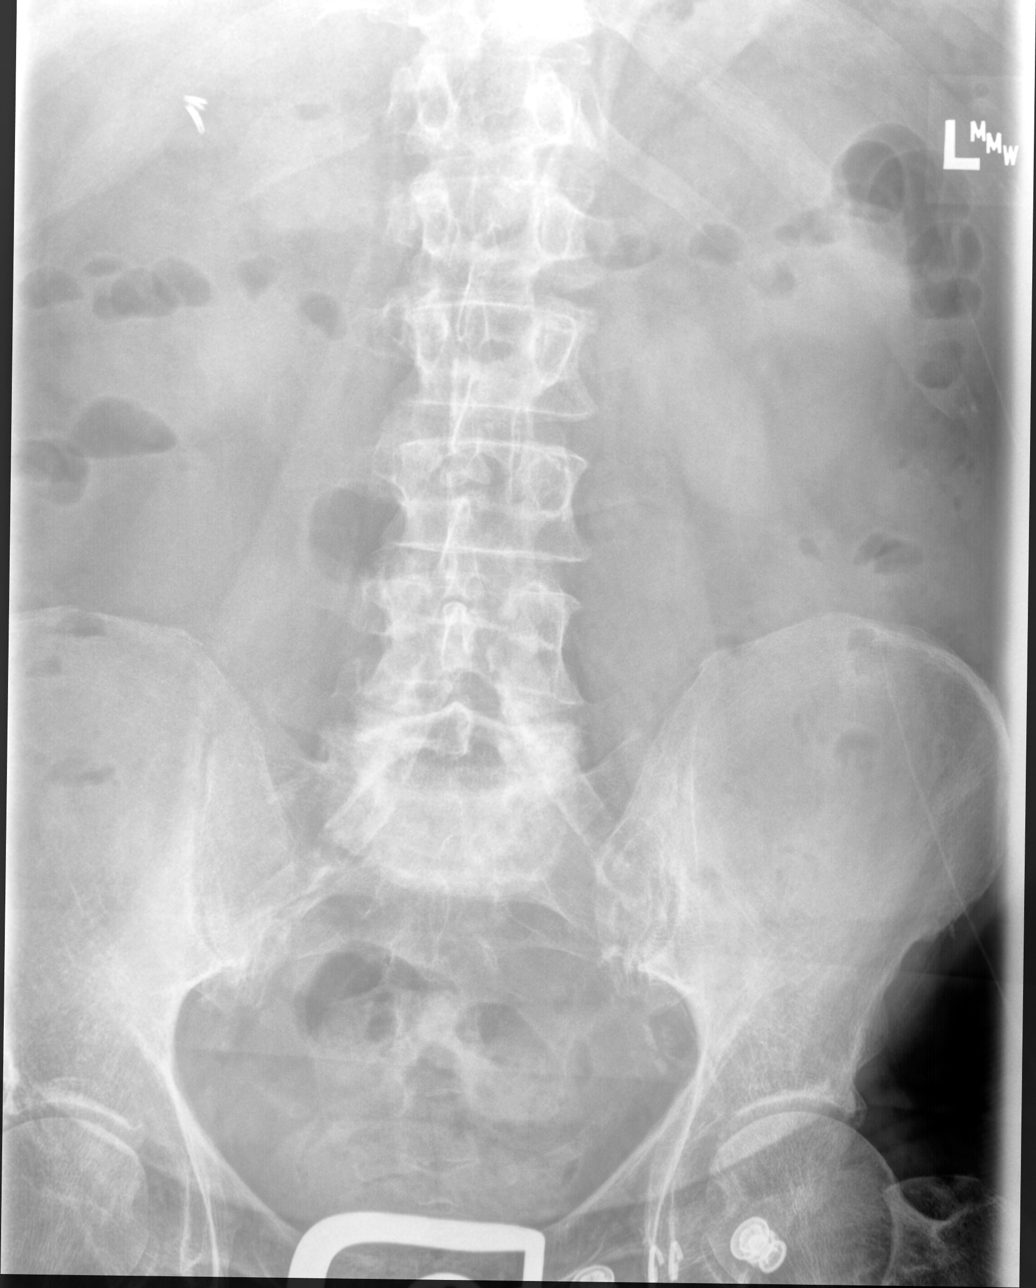

[lumbar spine mlo (1 of 2)]
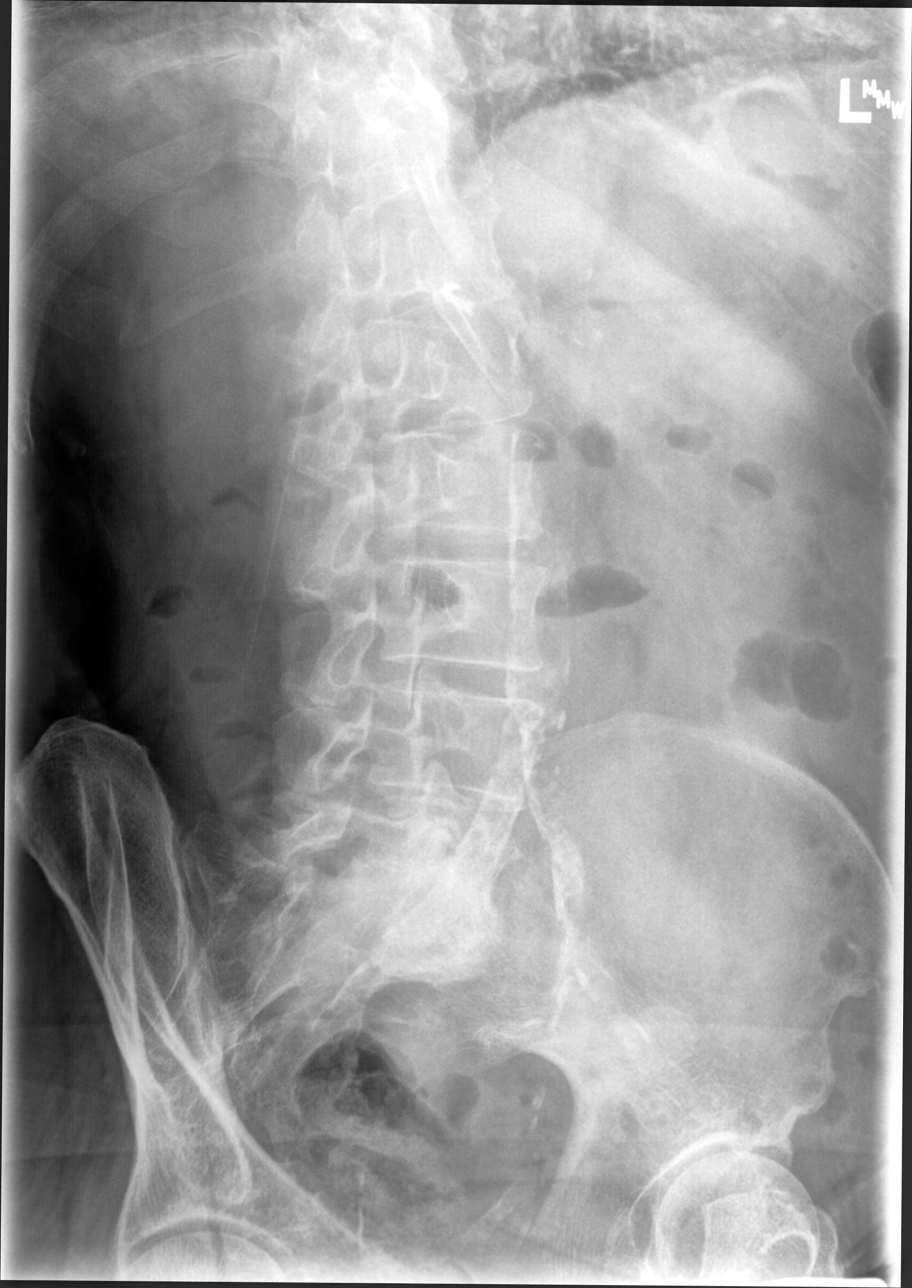

[lumbar spine mlo (2 of 2)]
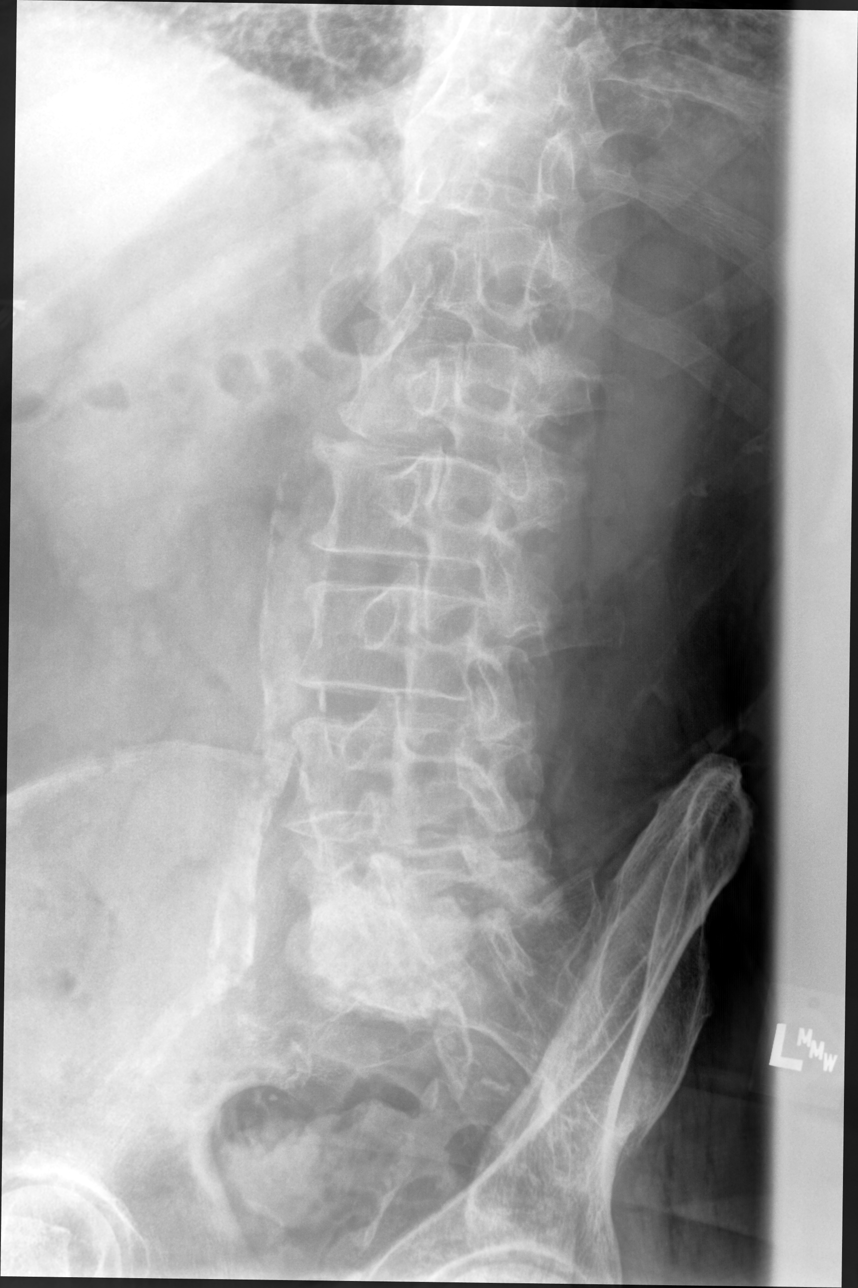

[lumbar spine lat]
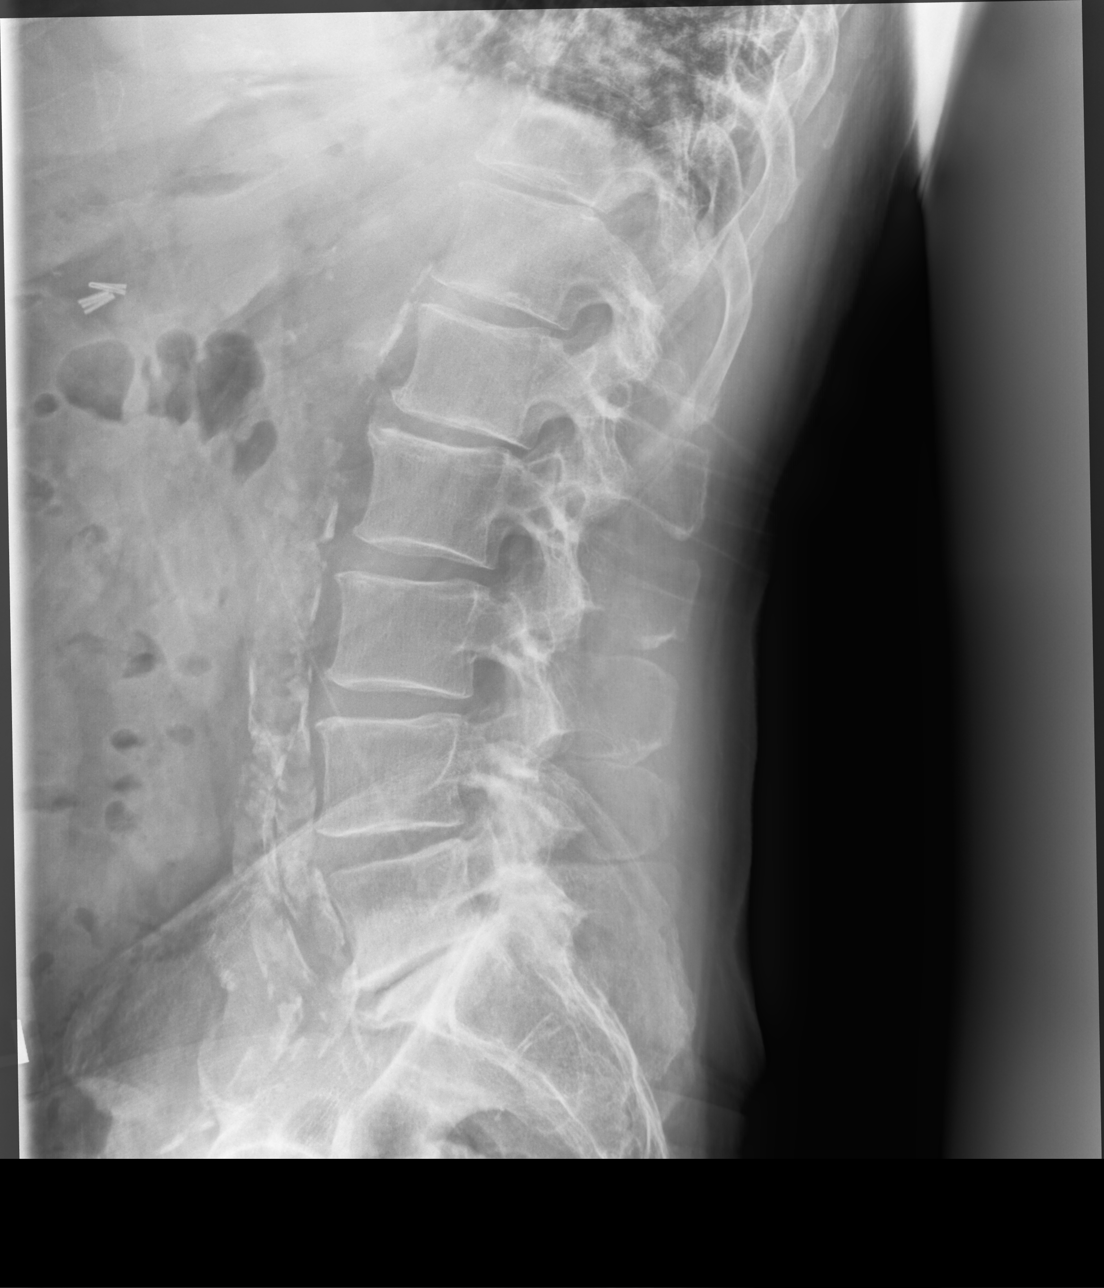

[4 of 4 positions shown; findings below may reference images not displayed]

FINDINGS: Lumbar spinal alignment is maintained. Vertebral body heights are
normal.

No sign of fracture. Approximately 3 mm retrolisthesis of T12 on L1.
Similar mild retrolisthesis of L1 on L2. Not definitely seen on
previous examinations.

Disc space narrowing, moderate at T12-L1, L1-L2 with mild disc space
narrowing at L2-3, L3-4 and L4-5. Marked disc space narrowing and
associated sclerosis of the endplates of L5 and S1.

Facet arthropathy greatest at L4-5 and L5-S1.
IMPRESSION: 1. Multilevel degenerative disc disease greatest at L5-S1.
2. Mild retrolisthesis of T12 on L1 and L1 on L2, not seen on the
previous exam and associated with some slight increase in disc space
narrowing at these levels since the prior study from [facility]

## 2022-12-13 ENCOUNTER — Encounter: Payer: Self-pay | Admitting: Infectious Diseases

## 2022-12-18 DIAGNOSIS — M5416 Radiculopathy, lumbar region: Secondary | ICD-10-CM | POA: Diagnosis not present

## 2022-12-29 DIAGNOSIS — H353223 Exudative age-related macular degeneration, left eye, with inactive scar: Secondary | ICD-10-CM | POA: Diagnosis not present

## 2022-12-29 DIAGNOSIS — H43821 Vitreomacular adhesion, right eye: Secondary | ICD-10-CM | POA: Diagnosis not present

## 2022-12-29 DIAGNOSIS — H353124 Nonexudative age-related macular degeneration, left eye, advanced atrophic with subfoveal involvement: Secondary | ICD-10-CM | POA: Diagnosis not present

## 2022-12-29 DIAGNOSIS — H353113 Nonexudative age-related macular degeneration, right eye, advanced atrophic without subfoveal involvement: Secondary | ICD-10-CM | POA: Diagnosis not present

## 2023-01-04 ENCOUNTER — Encounter: Payer: Self-pay | Admitting: Infectious Diseases

## 2023-01-04 ENCOUNTER — Other Ambulatory Visit: Payer: Self-pay

## 2023-01-04 ENCOUNTER — Ambulatory Visit (INDEPENDENT_AMBULATORY_CARE_PROVIDER_SITE_OTHER): Payer: BC Managed Care – PPO | Admitting: Infectious Diseases

## 2023-01-04 VITALS — BP 144/83 | HR 90 | Temp 97.3°F | Ht 66.0 in | Wt 146.0 lb

## 2023-01-04 DIAGNOSIS — T8149XA Infection following a procedure, other surgical site, initial encounter: Secondary | ICD-10-CM | POA: Diagnosis not present

## 2023-01-04 MED ORDER — CEFADROXIL 500 MG PO CAPS: 1000.0000 mg | ORAL_CAPSULE | Freq: Two times a day (BID) | ORAL | 8 refills | Status: DC

## 2023-01-04 NOTE — Assessment & Plan Note (Signed)
Completed 6 [place] IV cefazolin and now 1 gm BID cefadroxil x 3 months and doing very well.  His incision is healed, still a hyperkeratotic spot on the lower portion of the scab that does not appear to be open or draining at all. I encouraged them to ask Dr. Maurice Small about management for that.   Will decrease to suppressive 500 mg BID cefadroxil and finish out 1 year of treatment. We reviewed hospital course and concern that the hardware may have been involved; only way to know if the infection is gone is to stop and watch but I would recommend to finish out a year of treatment. Will have him back in 4 months for eval.   He looks so good today I don't see the need for any blood work today - he has follow up with blood work scheduled with PCP soon - I asked to have a CRP obtained, defer ESR with h/o RA. Had CBC soon and no concern for antibiotic associated effects on cell lines.

## 2023-01-04 NOTE — Patient Instructions (Addendum)
Please ask Micheal Serrano to draw a CRP (c-reactive protein).   Drop the antibiotic down to 1 capsule (500 mg) twice a day.   Capsicin cream is what we talked about that may help with nerve pain.  Just wash your hands after so you don't burn your eyes.   I don't know why the other cream helps.... but glad it does.

## 2023-01-04 NOTE — Progress Notes (Signed)
Patient: Micheal Serrano  DOB: 12-Sep-1955 MRN: [id number] PCP: Roe Rutherford, NP     Subjective:  Micheal Serrano is a 67 y.o. male here for follow up MSSA bacteremia complicated by fluid collection at L5-S1 09/23/2022 after having had microdiscectomy 3/18 and reported purulent drainage from spine incision. Came to ER 4/13 for the same and found to have MSSA bacteremia and called back to hospital for admission and work up. TTE was assessed and found to be completely normal w/o any concern.   Completed 8 Skibinski of IV antibiotics and converted to oral cefadroxil.     He and his wife are here today for follow up.   They are doing well but concerned over his back incision. His back has stopped leaking fluid over a week ago. There are a few scabbed areas that are in the mid incision that flake off and appear to have a little pin hole there but no drainage. He still has left foot pain/numbness.   Had a visit with Dr. Maurice Small in late May for second opinion - he felt like he has a screw that is pushing on a nerve around S1. They are no longer working with Dr. Lorenso Courier. Changing to Cymbalta from Gabapentin to try to treat the nerve pain in the left foot.   He has a small hyperkeratotic horn shaped protrusion at the lower part of his incision. Nothing draining and all has otherwise healed over. He finally has had a resolution of the tingling/increased sensitivity around the incision.   He continues to hold the Humira for his RA treatments and not interested in continuing this. He has remained afebrile and feeling well.    Review of Systems  Constitutional:  Negative for chills and fever.  Respiratory: Negative.    Cardiovascular:  Negative for chest pain.  Gastrointestinal: Negative.   Genitourinary: Negative.   Musculoskeletal:  Negative for back pain.  Skin:  Negative for rash.  Neurological:  Positive for focal weakness (left foot). Negative for dizziness and headaches.     Past  Medical History:  Diagnosis Date   Alcohol abuse    GERD (gastroesophageal reflux disease)    Hepatitis C    states this has been treated and is clear   Hypertension    Multiple subsegmental pulmonary emboli without acute cor pulmonale (HCC) 09/14/2020   Neck pain    Polycythemia, secondary 05/03/2021    Outpatient Medications Prior to Visit  Medication Sig Dispense Refill   acetaminophen (TYLENOL) 325 MG tablet Take 650 mg by mouth every 6 (six) hours as needed for mild pain.     atorvastatin (LIPITOR) 20 MG tablet Take 20 mg by mouth daily.     DULoxetine (CYMBALTA) 20 MG capsule Take by mouth.     folic acid (FOLVITE) 1 MG tablet Take 1 mg by mouth daily.     gabapentin (NEURONTIN) 600 MG tablet Take 1,200 mg by mouth 2 (two) times daily.     olmesartan (BENICAR) 20 MG tablet Take 20 mg by mouth.     TRELEGY ELLIPTA 100-62.5-25 MCG/ACT AEPB Inhale 1 puff into the lungs daily.     cefadroxil (DURICEF) 500 MG capsule Take 2 capsules (1,000 mg total) by mouth 2 (two) times daily. 120 capsule 11   apixaban (ELIQUIS) 5 MG TABS tablet Take 1 tablet (5 mg total) by mouth 2 (two) times daily. 180 tablet 1   No facility-administered medications prior to visit.     No Known  Allergies  Social History   Tobacco Use   Smoking status: Former    Current packs/day: 0.00    Average packs/day: 1 pack/day for 25.0 years (25.0 ttl pk-yrs)    Types: Cigarettes    Start date: 09/14/1995    Quit date: 09/13/2020    Years since quitting: 2.3   Smokeless tobacco: Never   Tobacco comments:    1 pack a day x  x 30 yrs  Vaping Use   Vaping status: Never Used  Substance Use Topics   Alcohol use: Yes    Alcohol/week: 14.0 standard drinks of alcohol    Types: 14 Cans of beer per week    Comment: 2-3 beers a day   Drug use: No    Family History  Problem Relation Age of Onset   Breast cancer Mother    Thyroid disease Mother    Stroke Mother    Dementia Father    Colon cancer Neg Hx      Objective:   Vitals:   01/04/23 1438  BP: (!) 144/83  Pulse: 90  Temp: (!) 97.3 F (36.3 C)  TempSrc: Oral  SpO2: 98%  Weight: 146 lb (66.2 kg)  Height: 5\' 6"  (1.676 m)    Body mass index is 23.57 kg/m.  Physical Exam Vitals reviewed.  Constitutional:      Appearance: Normal appearance. He is not ill-appearing.  HENT:     Mouth/Throat:     Mouth: Mucous membranes are moist.     Pharynx: Oropharynx is clear.  Eyes:     Pupils: Pupils are equal, round, and reactive to light.  Cardiovascular:     Rate and Rhythm: Normal rate and regular rhythm.  Pulmonary:     Effort: Pulmonary effort is normal.     Breath sounds: Normal breath sounds.  Abdominal:     General: There is no distension.     Tenderness: There is no abdominal tenderness.  Musculoskeletal:        General: Normal range of motion.  Skin:    General: Skin is warm and dry.     Findings: No erythema or rash.     Comments: Surgical incision completely healed. Small hyperkeratotic lesion to the incision at the more distal incision. No erythema.   Neurological:     Mental Status: He is alert.     Lab Results: Lab Results  Component Value Date   WBC 7.8 10/05/2022   HGB 12.5 (L) 10/05/2022   HCT 37.0 (L) 10/05/2022   MCV 91.1 10/05/2022   PLT 357 10/05/2022    Lab Results  Component Value Date   CREATININE 0.75 10/05/2022   BUN <5 (L) 10/05/2022   NA 140 10/05/2022   K 4.1 10/05/2022   CL 107 10/05/2022   CO2 24 10/05/2022    Lab Results  Component Value Date   ALT 10 10/05/2022   AST 13 (L) 10/05/2022   ALKPHOS 45 10/05/2022   BILITOT 0.5 10/05/2022     Assessment & Plan:   Problem List Items Addressed This Visit       Unprioritized   Surgical site infection, Lumbar Spine - Primary    Completed 6 Menges IV cefazolin and now 1 gm BID cefadroxil x 3 months and doing very well.  His incision is healed, still a hyperkeratotic spot on the lower portion of the scab that does not appear to  be open or draining at all. I encouraged them to ask Dr. Maurice Small about management for  that.   Will decrease to suppressive 500 mg BID cefadroxil and finish out 1 year of treatment. We reviewed hospital course and concern that the hardware may have been involved; only way to know if the infection is gone is to stop and watch but I would recommend to finish out a year of treatment. Will have him back in 4 months for eval.   He looks so good today I don't see the need for any blood work today - he has follow up with blood work scheduled with PCP soon - I asked to have a CRP obtained, defer ESR with h/o RA. Had CBC soon and no concern for antibiotic associated effects on cell lines.        Relevant Medications   cefadroxil (DURICEF) 500 MG capsule   Total Encounter Time: 33 minutes spent in face to face discussion with the patient and his wife.   Rexene Alberts, MSN, NP-C [facility] for Infectious Disease [facility] Pager: [phone] Office: [phone]  01/04/23  4:04 PM

## 2023-01-16 DIAGNOSIS — I10 Essential (primary) hypertension: Secondary | ICD-10-CM | POA: Diagnosis not present

## 2023-01-16 DIAGNOSIS — F17219 Nicotine dependence, cigarettes, with unspecified nicotine-induced disorders: Secondary | ICD-10-CM | POA: Diagnosis not present

## 2023-01-16 DIAGNOSIS — G5792 Unspecified mononeuropathy of left lower limb: Secondary | ICD-10-CM | POA: Diagnosis not present

## 2023-01-16 DIAGNOSIS — M79672 Pain in left foot: Secondary | ICD-10-CM | POA: Diagnosis not present

## 2023-01-17 ENCOUNTER — Encounter (HOSPITAL_COMMUNITY): Payer: Self-pay | Admitting: Hematology

## 2023-03-29 DIAGNOSIS — H9222 Otorrhagia, left ear: Secondary | ICD-10-CM | POA: Diagnosis not present

## 2023-03-29 DIAGNOSIS — I10 Essential (primary) hypertension: Secondary | ICD-10-CM | POA: Diagnosis not present

## 2023-04-03 DIAGNOSIS — N400 Enlarged prostate without lower urinary tract symptoms: Secondary | ICD-10-CM | POA: Diagnosis not present

## 2023-04-03 DIAGNOSIS — I700 Atherosclerosis of aorta: Secondary | ICD-10-CM | POA: Diagnosis not present

## 2023-04-03 DIAGNOSIS — I10 Essential (primary) hypertension: Secondary | ICD-10-CM | POA: Diagnosis not present

## 2023-04-03 DIAGNOSIS — M0579 Rheumatoid arthritis with rheumatoid factor of multiple sites without organ or systems involvement: Secondary | ICD-10-CM | POA: Diagnosis not present

## 2023-05-02 ENCOUNTER — Ambulatory Visit: Payer: BC Managed Care – PPO | Admitting: Infectious Diseases

## 2023-05-23 ENCOUNTER — Ambulatory Visit: Payer: BC Managed Care – PPO | Admitting: Infectious Diseases

## 2023-06-19 ENCOUNTER — Ambulatory Visit: Payer: BC Managed Care – PPO | Admitting: Infectious Diseases

## 2023-07-16 ENCOUNTER — Other Ambulatory Visit: Payer: Self-pay

## 2023-07-16 ENCOUNTER — Ambulatory Visit (INDEPENDENT_AMBULATORY_CARE_PROVIDER_SITE_OTHER): Payer: Medicare Other | Admitting: Infectious Diseases

## 2023-07-16 VITALS — Wt 151.0 lb

## 2023-07-16 DIAGNOSIS — Z8619 Personal history of other infectious and parasitic diseases: Secondary | ICD-10-CM

## 2023-07-16 DIAGNOSIS — T8149XA Infection following a procedure, other surgical site, initial encounter: Secondary | ICD-10-CM

## 2023-07-16 DIAGNOSIS — T8149XD Infection following a procedure, other surgical site, subsequent encounter: Secondary | ICD-10-CM

## 2023-07-16 DIAGNOSIS — Z792 Long term (current) use of antibiotics: Secondary | ICD-10-CM | POA: Diagnosis not present

## 2023-07-16 MED ORDER — CEFADROXIL 500 MG PO CAPS: 1000.0000 mg | ORAL_CAPSULE | Freq: Two times a day (BID) | ORAL | 0 refills | Status: AC

## 2023-07-16 NOTE — Progress Notes (Unsigned)
Patient: Micheal Serrano  DOB: 07-04-1955 MRN: [id number] PCP: Roe Rutherford, NP     Subjective:  Micheal Serrano is a 68 y.o. male here for follow up MSSA bacteremia complicated by fluid collection at L5-S1 09/23/2022 after having had microdiscectomy 3/18 and reported purulent drainage from spine incision. Came to ER 4/13 for the same and found to have MSSA bacteremia and called back to hospital for admission and work up. TTE was assessed and found to be completely normal w/o any concern.   Completed 8 Hecht of IV antibiotics and converted to oral cefadroxil.   Discussed the use of AI scribe software for clinical note transcription with the patient, who gave verbal consent to proceed.   History of Present Illness   Micheal Serrano is here with his wife for follow up on MSSA bacteremia thought to be due to post op spine infection with hardware involvement.    He states he stopped his cefadroxil about 3 months ago and has been feeling great since then. He has not noticed any worsened back pain He continues to experience and work through symptoms of nerve damage in his left leg, causing pain every night. He recalls using a cream in the past for relief, but it is unclear if it was effective. He has a history of multiple back surgeries and is clear about avoiding further surgical interventions. No sweats or chills. He has continued to stay off Humira injections and so far only noted some pain in the right hand that is tolerable.   He has concerns about sun damage, particularly on his nose, which he refers to as 'sun poisoning.' He grew up without using sunscreen and has had multiple skin lesions treated with cryotherapy in the past. He recently had a biopsy on a lesion and is awaiting results, with a follow-up scheduled for March 12th for further treatment. He frequently has lesions on his head and hands that require freezing. He has learned to be cautious with sun exposure, wearing long sleeves and  applying sunscreen, especially on his ears, which have been treated for lesions in the past. He has one open healing lesion from skin biopsy on left anterior shoulder.       Review of Systems  Constitutional:  Negative for chills and fever.  Respiratory: Negative.    Cardiovascular:  Negative for chest pain.  Gastrointestinal: Negative.   Genitourinary: Negative.   Musculoskeletal:  Negative for back pain.  Skin:  Negative for rash.  Neurological:  Positive for focal weakness (left foot). Negative for dizziness and headaches.     Past Medical History:  Diagnosis Date   Alcohol abuse    GERD (gastroesophageal reflux disease)    Hepatitis C    states this has been treated and is clear   Hypertension    Multiple subsegmental pulmonary emboli without acute cor pulmonale (HCC) 09/14/2020   Neck pain    Polycythemia, secondary 05/03/2021    Outpatient Medications Prior to Visit  Medication Sig Dispense Refill   acetaminophen (TYLENOL) 325 MG tablet Take 650 mg by mouth every 6 (six) hours as needed for mild pain.     apixaban (ELIQUIS) 5 MG TABS tablet Take 1 tablet (5 mg total) by mouth 2 (two) times daily. 180 tablet 1   atorvastatin (LIPITOR) 20 MG tablet Take 20 mg by mouth daily.     DULoxetine (CYMBALTA) 20 MG capsule Take by mouth.     folic acid (FOLVITE) 1 MG tablet Take 1  mg by mouth daily.     gabapentin (NEURONTIN) 600 MG tablet Take 1,200 mg by mouth 2 (two) times daily.     olmesartan (BENICAR) 20 MG tablet Take 20 mg by mouth.     TRELEGY ELLIPTA 100-62.5-25 MCG/ACT AEPB Inhale 1 puff into the lungs daily.     cefadroxil (DURICEF) 500 MG capsule Take 2 capsules (1,000 mg total) by mouth 2 (two) times daily. 120 capsule 8   No facility-administered medications prior to visit.     No Known Allergies  Social History   Tobacco Use   Smoking status: Former    Current packs/day: 0.00    Average packs/day: 1 pack/day for 25.0 years (25.0 ttl pk-yrs)    Types:  Cigarettes    Start date: 09/14/1995    Quit date: 09/13/2020    Years since quitting: 2.8   Smokeless tobacco: Never   Tobacco comments:    1 pack a day x  x 30 yrs  Vaping Use   Vaping status: Never Used  Substance Use Topics   Alcohol use: Yes    Alcohol/week: 14.0 standard drinks of alcohol    Types: 14 Cans of beer per week    Comment: 2-3 beers a day   Drug use: No    Family History  Problem Relation Age of Onset   Breast cancer Mother    Thyroid disease Mother    Stroke Mother    Dementia Father    Colon cancer Neg Hx     Objective:   Vitals:   07/16/23 1515  Weight: 151 lb (68.5 kg)     Body mass index is 24.37 kg/m.  Physical Exam Vitals reviewed.  Constitutional:      Appearance: Normal appearance. He is not ill-appearing.  HENT:     Mouth/Throat:     Mouth: Mucous membranes are moist.     Pharynx: Oropharynx is clear.  Eyes:     Pupils: Pupils are equal, round, and reactive to light.  Cardiovascular:     Rate and Rhythm: Normal rate and regular rhythm.  Pulmonary:     Effort: Pulmonary effort is normal.     Breath sounds: Normal breath sounds.  Abdominal:     General: There is no distension.     Tenderness: There is no abdominal tenderness.  Musculoskeletal:        General: Normal range of motion.  Skin:    General: Skin is warm and dry.     Findings: No erythema or rash.     Comments: Surgical incision completely healed. Small hyperkeratotic lesion to the incision at the more distal incision. No erythema.   Neurological:     Mental Status: He is alert.     Lab Results: Lab Results  Component Value Date   WBC 8.2 07/16/2023   HGB 18.1 (H) 07/16/2023   HCT 52.3 (H) 07/16/2023   MCV 94.6 07/16/2023   PLT 191 07/16/2023    Lab Results  Component Value Date   CREATININE 0.75 10/05/2022   BUN <5 (L) 10/05/2022   NA 140 10/05/2022   K 4.1 10/05/2022   CL 107 10/05/2022   CO2 24 10/05/2022    Lab Results  Component Value Date   ALT  10 10/05/2022   AST 13 (L) 10/05/2022   ALKPHOS 45 10/05/2022   BILITOT 0.5 10/05/2022     Assessment & Plan:  Assessment and Plan    H/O MSSA Bacteremia 2024-  Surgical Site Infection L Spine  with Hardware -  He completed about 8 or 9 months of antibiotics for this infection including 8 Hoos of IV cefazolin. Since self stopping of antibiotics 3 months ago he feels very good and has not noticed any negative effect. He has persistent nerve symptoms in left foot that are stable. We talked about continuing to monitor off antibiotics which he would prefer to do. Discussed that the only true test of cure is to stop and monitor. He has done well with resolved inflammatory markers now > 6 m and feel this is a reasonable plan for him.  -CRP, ESR, CBC today  -If normal will continue with observation off antibiotics. I did give a 2 week supply to have on hand in the event he notices some increase in back pain or development of infectious symptoms.  -Would still recommend to avoid Humira  -RTC in 21m to check in again    Skin Lesions - Multiple skin lesions likely secondary to sun exposure. Recent biopsy of a lesion with pending results - site is slow to heal but no signs of infection on exam of left anterior shoulder today.  -Continue with scheduled dermatology follow-up -Encouraged to continue using sunscreen and protective clothing.      Meds ordered this encounter  Medications   cefadroxil (DURICEF) 500 MG capsule    Sig: Take 2 capsules (1,000 mg total) by mouth 2 (two) times daily for 14 days.    Dispense:  56 capsule    Refill:  0   Orders Placed This Encounter  Procedures   CBC w/Diff   C-reactive protein   Sedimentation rate   RTC in 57m    Rexene Alberts, MSN, NP-C [facility] for Infectious Disease [facility] Pager: [phone] Office: [phone]  07/17/23  9:41 AM

## 2023-07-17 LAB — CBC WITH DIFFERENTIAL/PLATELET
Absolute Lymphocytes: 1886 {cells}/uL (ref 850–3900)
Absolute Monocytes: 951 {cells}/uL — ABNORMAL HIGH (ref 200–950)
Basophils Absolute: 57 {cells}/uL (ref 0–200)
Basophils Relative: 0.7 %
Eosinophils Absolute: 98 {cells}/uL (ref 15–500)
Eosinophils Relative: 1.2 %
HCT: 52.3 % — ABNORMAL HIGH (ref 38.5–50.0)
Hemoglobin: 18.1 g/dL — ABNORMAL HIGH (ref 13.2–17.1)
MCH: 32.7 pg (ref 27.0–33.0)
MCHC: 34.6 g/dL (ref 32.0–36.0)
MCV: 94.6 fL (ref 80.0–100.0)
MPV: 8.7 fL (ref 7.5–12.5)
Monocytes Relative: 11.6 %
Neutro Abs: 5207 {cells}/uL (ref 1500–7800)
Neutrophils Relative %: 63.5 %
Platelets: 191 10*3/uL (ref 140–400)
RBC: 5.53 10*6/uL (ref 4.20–5.80)
RDW: 12.6 % (ref 11.0–15.0)
Total Lymphocyte: 23 %
WBC: 8.2 10*3/uL (ref 3.8–10.8)

## 2023-07-17 LAB — C-REACTIVE PROTEIN: CRP: <3 mg/L (ref <8.0)

## 2023-07-17 LAB — SEDIMENTATION RATE: Sed Rate: 11 mm/h (ref 0–20)

## 2023-07-19 ENCOUNTER — Encounter (HOSPITAL_COMMUNITY): Payer: Self-pay | Admitting: Hematology

## 2023-10-03 DIAGNOSIS — E782 Mixed hyperlipidemia: Secondary | ICD-10-CM | POA: Diagnosis not present

## 2023-10-03 DIAGNOSIS — Z79899 Other long term (current) drug therapy: Secondary | ICD-10-CM | POA: Diagnosis not present

## 2023-10-03 DIAGNOSIS — R739 Hyperglycemia, unspecified: Secondary | ICD-10-CM | POA: Diagnosis not present

## 2023-10-03 DIAGNOSIS — I700 Atherosclerosis of aorta: Secondary | ICD-10-CM | POA: Diagnosis not present

## 2023-10-03 DIAGNOSIS — R972 Elevated prostate specific antigen [PSA]: Secondary | ICD-10-CM | POA: Diagnosis not present

## 2023-10-03 DIAGNOSIS — R7881 Bacteremia: Secondary | ICD-10-CM | POA: Diagnosis not present

## 2023-10-03 DIAGNOSIS — B9561 Methicillin susceptible Staphylococcus aureus infection as the cause of diseases classified elsewhere: Secondary | ICD-10-CM | POA: Diagnosis not present

## 2023-10-09 ENCOUNTER — Ambulatory Visit: Payer: BC Managed Care – PPO | Admitting: Infectious Diseases

## 2024-01-23 ENCOUNTER — Other Ambulatory Visit: Payer: Self-pay

## 2024-02-01 ENCOUNTER — Encounter: Payer: Self-pay | Admitting: Radiology

## 2024-04-14 ENCOUNTER — Encounter: Payer: Self-pay | Admitting: Radiology
